# Patient Record
Sex: Male | Born: 1955 | Race: White | Hispanic: No | Marital: Married | Smoking: Never smoker
Health system: Southern US, Community
[De-identification: ages and names within clinical notes are randomized; demographics above are authoritative.]

## PROBLEM LIST (undated history)

## (undated) DIAGNOSIS — G4733 Obstructive sleep apnea (adult) (pediatric): Secondary | ICD-10-CM

## (undated) DIAGNOSIS — J309 Allergic rhinitis, unspecified: Secondary | ICD-10-CM

## (undated) DIAGNOSIS — R350 Frequency of micturition: Secondary | ICD-10-CM

## (undated) DIAGNOSIS — E119 Type 2 diabetes mellitus without complications: Secondary | ICD-10-CM

## (undated) DIAGNOSIS — N320 Bladder-neck obstruction: Secondary | ICD-10-CM

## (undated) DIAGNOSIS — S83206S Unspecified tear of unspecified meniscus, current injury, right knee, sequela: Secondary | ICD-10-CM

## (undated) DIAGNOSIS — K219 Gastro-esophageal reflux disease without esophagitis: Secondary | ICD-10-CM

## (undated) DIAGNOSIS — G4730 Sleep apnea, unspecified: Secondary | ICD-10-CM

## (undated) DIAGNOSIS — Z973 Presence of spectacles and contact lenses: Secondary | ICD-10-CM

## (undated) DIAGNOSIS — E785 Hyperlipidemia, unspecified: Secondary | ICD-10-CM

## (undated) DIAGNOSIS — D649 Anemia, unspecified: Secondary | ICD-10-CM

## (undated) DIAGNOSIS — M1990 Unspecified osteoarthritis, unspecified site: Secondary | ICD-10-CM

## (undated) DIAGNOSIS — D509 Iron deficiency anemia, unspecified: Secondary | ICD-10-CM

## (undated) DIAGNOSIS — F419 Anxiety disorder, unspecified: Secondary | ICD-10-CM

## (undated) DIAGNOSIS — R0789 Other chest pain: Secondary | ICD-10-CM

## (undated) DIAGNOSIS — N401 Enlarged prostate with lower urinary tract symptoms: Secondary | ICD-10-CM

## (undated) DIAGNOSIS — N400 Enlarged prostate without lower urinary tract symptoms: Secondary | ICD-10-CM

## (undated) HISTORY — PX: HEMORROIDECTOMY: SUR656

## (undated) HISTORY — PX: HERNIA REPAIR: SHX51

## (undated) HISTORY — PX: NASAL SINUS SURGERY: SHX719

## (undated) HISTORY — PX: TONSILLECTOMY: SUR1361

---

## 1979-11-17 HISTORY — PX: APPENDECTOMY: SHX54

## 1999-11-17 DIAGNOSIS — J189 Pneumonia, unspecified organism: Secondary | ICD-10-CM

## 1999-11-17 HISTORY — DX: Pneumonia, unspecified organism: J18.9

## 2008-11-16 HISTORY — PX: INGUINAL HERNIA REPAIR: SUR1180

## 2014-11-16 HISTORY — PX: UMBILICAL HERNIA REPAIR: SHX196

## 2017-11-16 HISTORY — PX: OTHER SURGICAL HISTORY: SHX169

## 2018-04-27 DIAGNOSIS — E119 Type 2 diabetes mellitus without complications: Secondary | ICD-10-CM | POA: Diagnosis not present

## 2018-04-27 DIAGNOSIS — E785 Hyperlipidemia, unspecified: Secondary | ICD-10-CM | POA: Diagnosis not present

## 2018-05-23 DIAGNOSIS — J301 Allergic rhinitis due to pollen: Secondary | ICD-10-CM | POA: Diagnosis not present

## 2018-05-25 DIAGNOSIS — E119 Type 2 diabetes mellitus without complications: Secondary | ICD-10-CM | POA: Diagnosis not present

## 2018-05-25 DIAGNOSIS — J301 Allergic rhinitis due to pollen: Secondary | ICD-10-CM | POA: Diagnosis not present

## 2018-06-13 DIAGNOSIS — E785 Hyperlipidemia, unspecified: Secondary | ICD-10-CM | POA: Diagnosis not present

## 2018-06-13 DIAGNOSIS — E119 Type 2 diabetes mellitus without complications: Secondary | ICD-10-CM | POA: Diagnosis not present

## 2018-07-06 DIAGNOSIS — M25511 Pain in right shoulder: Secondary | ICD-10-CM | POA: Diagnosis not present

## 2018-07-06 DIAGNOSIS — M19011 Primary osteoarthritis, right shoulder: Secondary | ICD-10-CM | POA: Diagnosis not present

## 2018-07-06 DIAGNOSIS — M19012 Primary osteoarthritis, left shoulder: Secondary | ICD-10-CM | POA: Diagnosis not present

## 2018-07-28 DIAGNOSIS — M7631 Iliotibial band syndrome, right leg: Secondary | ICD-10-CM | POA: Diagnosis not present

## 2018-07-28 DIAGNOSIS — M1711 Unilateral primary osteoarthritis, right knee: Secondary | ICD-10-CM | POA: Diagnosis not present

## 2018-08-19 DIAGNOSIS — M7672 Peroneal tendinitis, left leg: Secondary | ICD-10-CM | POA: Diagnosis not present

## 2018-08-19 DIAGNOSIS — M67472 Ganglion, left ankle and foot: Secondary | ICD-10-CM | POA: Diagnosis not present

## 2018-08-19 DIAGNOSIS — M79672 Pain in left foot: Secondary | ICD-10-CM | POA: Diagnosis not present

## 2018-08-23 DIAGNOSIS — Z1211 Encounter for screening for malignant neoplasm of colon: Secondary | ICD-10-CM | POA: Diagnosis not present

## 2018-09-01 DIAGNOSIS — J301 Allergic rhinitis due to pollen: Secondary | ICD-10-CM | POA: Diagnosis not present

## 2018-09-05 DIAGNOSIS — J301 Allergic rhinitis due to pollen: Secondary | ICD-10-CM | POA: Diagnosis not present

## 2018-09-08 DIAGNOSIS — M2341 Loose body in knee, right knee: Secondary | ICD-10-CM | POA: Diagnosis not present

## 2018-09-14 DIAGNOSIS — M19012 Primary osteoarthritis, left shoulder: Secondary | ICD-10-CM | POA: Diagnosis not present

## 2018-09-19 DIAGNOSIS — E119 Type 2 diabetes mellitus without complications: Secondary | ICD-10-CM | POA: Diagnosis not present

## 2018-09-19 DIAGNOSIS — Z23 Encounter for immunization: Secondary | ICD-10-CM | POA: Diagnosis not present

## 2018-09-19 DIAGNOSIS — Z0000 Encounter for general adult medical examination without abnormal findings: Secondary | ICD-10-CM | POA: Diagnosis not present

## 2018-09-19 DIAGNOSIS — E785 Hyperlipidemia, unspecified: Secondary | ICD-10-CM | POA: Diagnosis not present

## 2018-09-19 DIAGNOSIS — Z125 Encounter for screening for malignant neoplasm of prostate: Secondary | ICD-10-CM | POA: Diagnosis not present

## 2018-09-21 DIAGNOSIS — Z1211 Encounter for screening for malignant neoplasm of colon: Secondary | ICD-10-CM | POA: Diagnosis not present

## 2018-09-21 DIAGNOSIS — K5730 Diverticulosis of large intestine without perforation or abscess without bleeding: Secondary | ICD-10-CM | POA: Diagnosis not present

## 2018-10-11 DIAGNOSIS — J310 Chronic rhinitis: Secondary | ICD-10-CM | POA: Diagnosis not present

## 2018-11-01 ENCOUNTER — Ambulatory Visit: Payer: Self-pay | Admitting: Allergy and Immunology

## 2018-11-04 DIAGNOSIS — Z7689 Persons encountering health services in other specified circumstances: Secondary | ICD-10-CM | POA: Diagnosis not present

## 2018-11-04 DIAGNOSIS — K5900 Constipation, unspecified: Secondary | ICD-10-CM | POA: Diagnosis not present

## 2021-04-10 ENCOUNTER — Other Ambulatory Visit: Payer: Self-pay

## 2021-04-10 ENCOUNTER — Encounter (HOSPITAL_BASED_OUTPATIENT_CLINIC_OR_DEPARTMENT_OTHER): Payer: Self-pay | Admitting: Specialist

## 2021-04-10 NOTE — Progress Notes (Signed)
Spoke w/ via phone for pre-op interview---PT Lab needs dos----  I stat             Lab results------ECHO 07-05-2020 CARE EVERYWHERE, lov dr Sigmund Hazel 04-08-2021 on chart, ekg 04-08-2021 dr Sigmund Hazel on chart hemaglobin a1c 04-08-2021, cmet 04-08-2021 cbc with dif 04-08-2021 all on chart COVID test -----EXPOSURE  04-15-2021 1010 Arrive at -------915 am 04-16-2021 NPO after MN NO Solid Food.  Clear liquids from MN until---815 am Med rec completed Medications to take morning of surgery -----atorvastatin, certrizine, sertraline, omeprazole, alfuzosin, carafate prn Diabetic medication -----none day of surgery Patient instructed no nail polish to be worn day of surgery n/a Patient instructed to bring photo id and insurance card day of surgery Patient aware to have Driver (ride ) / caregiver   Wife becky will stay  for 24 hours after surgery  Patient Special Instructions -----bring cpcp mask tubing and machine and leave in car Pre-Op special Istructions ----- Patient verbalized understanding of instructions that were given at this phone interview. Patient denies shortness of breath, chest pain, fever, cough at this phone interview.  Spoke with dr Greggory Stallion rose mda and made aware pt history and last hemglobin a1c 04-08-2021 was 10.8, pt to be moved to [facility] or due to uncontrolled dm per dr rose mda, called ashley at dr Thomasena Edis office and left voice mail message. made aware pt to be moved to [facility] due to uncontrolled dm per dr Greggory Stallion rose mda

## 2021-04-11 NOTE — H&P (Signed)
Patrick Cooley is an 65 y.o. male.   Chief Complaint: Right knee pain HPI: This is a pleasant 65 year old male complaining of right knee pain that has been going on for many years but recently worsening. He states that it is worse he tries to get down on the floor to play with grandchildren. He states he has a locking sensation in his knee. A MRI scan was obtained and reviewed and it reveals a small lateral meniscus tear, a loose body as well as some OA of the knee. Patient was offered surgical management and the risks associated with it. He has elected to proceed with surgery at this time.   Past Medical History:  Diagnosis Date  . Acute torn meniscus of knee, right, sequela    loose bodies also  . Allergic rhinitis   . Anemia    on iron po  . Anxiety   . Arthritis    oa all over  . Bladder outlet obstruction   . BPH (benign prostatic hyperplasia)   . DM type 2 (diabetes mellitus, type 2) (HCC)   . GERD (gastroesophageal reflux disease)   . Hyperlipemia   . Pneumonia 2001  . Sleep apnea    uses  cpap  . Tightness in chest    occ with exertion riding mountain bike  . Urinary frequency   . Wears glasses     Past Surgical History:  Procedure Laterality Date  . APPENDECTOMY  1981   open  . COLONSCOPY  2019  . HEMORROIDECTOMY  age 80  . HERNIA REPAIR     umbilicAL 2016  AND INGUINAL 2010 X 2  . NASAL SINUS SURGERY  yrs ago  . TONSILLECTOMY  as child    History reviewed. No pertinent family history. Social History:  reports that he has never smoked. He has never used smokeless tobacco. He reports current alcohol use. He reports that he does not use drugs.  Allergies: No Known Allergies  No medications prior to admission.    No results found for this or any previous visit (from the past 48 hour(s)). No results found.  Review of Systems  All other systems reviewed and are negative.   Height 5\' 5"  (1.651 m), weight 93.9 kg. Physical Exam Vitals reviewed.   Constitutional:      Appearance: Normal appearance. He is normal weight.  HENT:     Head: Normocephalic and atraumatic.  Musculoskeletal:     Comments: ROM 3-130 Cruciate and collateral ligaments are stable + lateral Mc[place]ray  Skin:    General: Skin is warm and dry.     Capillary Refill: Capillary refill takes less than 2 seconds.  Neurological:     General: No focal deficit present.     Mental Status: He is alert and oriented to person, place, and time.  Psychiatric:        Mood and Affect: Mood normal.        Behavior: Behavior normal.        Thought Content: Thought content normal.        Judgment: Judgment normal.      Assessment/Plan Right knee partial lateral meniscus tear, loose body, osteoarthritis: -It was discussed with the patient in the office surgical vs. nonsurgical management. He has elected to proceed with surgical management at this time. Risks and benefits of surgery discussed with the patient in the office.  He will be having a right knee arthroscopy, Partial lateral menisectomy, loose body removal and chondroplasty. Preoperative and  postoperative instructions discussed with the patient. He will follow up in the office in 2 weeks after surgery.   Cherie Dark, PA 04/11/2021, 1:03 PM

## 2021-04-15 ENCOUNTER — Other Ambulatory Visit: Payer: Self-pay | Admitting: Family Medicine

## 2021-04-15 ENCOUNTER — Other Ambulatory Visit (HOSPITAL_COMMUNITY)
Admission: RE | Admit: 2021-04-15 | Discharge: 2021-04-15 | Disposition: A | Discharge status: Home / Self Care | Payer: 59 | Source: Ambulatory Visit | Attending: Specialist | Admitting: Specialist

## 2021-04-15 DIAGNOSIS — Z01812 Encounter for preprocedural laboratory examination: Secondary | ICD-10-CM | POA: Diagnosis present

## 2021-04-15 DIAGNOSIS — Z20822 Contact with and (suspected) exposure to covid-19: Secondary | ICD-10-CM | POA: Insufficient documentation

## 2021-04-15 DIAGNOSIS — R197 Diarrhea, unspecified: Secondary | ICD-10-CM

## 2021-04-15 LAB — SARS CORONAVIRUS 2 (TAT 6-24 HRS): SARS Coronavirus 2: NEGATIVE

## 2021-04-16 ENCOUNTER — Ambulatory Visit (HOSPITAL_BASED_OUTPATIENT_CLINIC_OR_DEPARTMENT_OTHER): Admission: RE | Admit: 2021-04-16 | Payer: 59 | Source: Home / Self Care | Admitting: Specialist

## 2021-04-16 HISTORY — DX: Allergic rhinitis, unspecified: J30.9

## 2021-04-16 HISTORY — DX: Frequency of micturition: R35.0

## 2021-04-16 HISTORY — DX: Type 2 diabetes mellitus without complications: E11.9

## 2021-04-16 HISTORY — DX: Benign prostatic hyperplasia without lower urinary tract symptoms: N40.0

## 2021-04-16 HISTORY — DX: Unspecified tear of unspecified meniscus, current injury, right knee, sequela: S83.206S

## 2021-04-16 HISTORY — DX: Anemia, unspecified: D64.9

## 2021-04-16 HISTORY — DX: Other chest pain: R07.89

## 2021-04-16 HISTORY — DX: Unspecified osteoarthritis, unspecified site: M19.90

## 2021-04-16 HISTORY — DX: Sleep apnea, unspecified: G47.30

## 2021-04-16 HISTORY — DX: Presence of spectacles and contact lenses: Z97.3

## 2021-04-16 HISTORY — DX: Anxiety disorder, unspecified: F41.9

## 2021-04-16 HISTORY — DX: Bladder-neck obstruction: N32.0

## 2021-04-16 HISTORY — DX: Hyperlipidemia, unspecified: E78.5

## 2021-04-16 HISTORY — DX: Gastro-esophageal reflux disease without esophagitis: K21.9

## 2021-04-16 SURGERY — ARTHROSCOPY, KNEE, WITH MEDIAL MENISCECTOMY
Anesthesia: General | Site: Knee | Laterality: Right

## 2021-04-29 ENCOUNTER — Ambulatory Visit
Admission: RE | Admit: 2021-04-29 | Discharge: 2021-04-29 | Disposition: A | Discharge status: Home / Self Care | Payer: Self-pay | Source: Ambulatory Visit | Attending: Family Medicine | Admitting: Family Medicine

## 2021-04-29 ENCOUNTER — Other Ambulatory Visit: Payer: Self-pay | Admitting: Family Medicine

## 2021-04-29 ENCOUNTER — Ambulatory Visit
Admission: RE | Admit: 2021-04-29 | Discharge: 2021-04-29 | Disposition: A | Discharge status: Home / Self Care | Payer: 59 | Source: Ambulatory Visit | Attending: Family Medicine | Admitting: Family Medicine

## 2021-04-29 DIAGNOSIS — R197 Diarrhea, unspecified: Secondary | ICD-10-CM

## 2021-05-02 ENCOUNTER — Other Ambulatory Visit: Payer: 59

## 2021-05-13 ENCOUNTER — Other Ambulatory Visit: Payer: Self-pay

## 2021-05-13 ENCOUNTER — Encounter: Payer: Self-pay | Admitting: Internal Medicine

## 2021-05-13 ENCOUNTER — Ambulatory Visit (INDEPENDENT_AMBULATORY_CARE_PROVIDER_SITE_OTHER): Payer: 59 | Admitting: Internal Medicine

## 2021-05-13 VITALS — BP 140/84 | HR 83 | Ht 65.0 in | Wt 204.2 lb

## 2021-05-13 DIAGNOSIS — R072 Precordial pain: Secondary | ICD-10-CM | POA: Diagnosis not present

## 2021-05-13 DIAGNOSIS — G4733 Obstructive sleep apnea (adult) (pediatric): Secondary | ICD-10-CM

## 2021-05-13 DIAGNOSIS — E785 Hyperlipidemia, unspecified: Secondary | ICD-10-CM

## 2021-05-13 DIAGNOSIS — N400 Enlarged prostate without lower urinary tract symptoms: Secondary | ICD-10-CM | POA: Diagnosis not present

## 2021-05-13 DIAGNOSIS — E1169 Type 2 diabetes mellitus with other specified complication: Secondary | ICD-10-CM | POA: Diagnosis not present

## 2021-05-13 DIAGNOSIS — Z9989 Dependence on other enabling machines and devices: Secondary | ICD-10-CM

## 2021-05-13 NOTE — Progress Notes (Signed)
Cardiology Office Note:    Date:  05/13/2021   ID:  Patrick Cooley, DOB 12-04-1955, MRN [id number]  PCP:  Sigmund Hazel, MD  Cardiologist:  None  Electrophysiologist:  None   Referring MD: Sigmund Hazel, MD   Chief Complaint/Reason for Referral: Chest pressure  History of Present Illness:    Patrick Cooley is a 65 y.o. male with a history of anemia, anxiety, arthritis, DM type 2, GERD, hyperlipemia, sleep apnea (with CPAP) who presents today for initial evaluation and management of chest pressure.   Today, he reports that his chest pressure occurs primarily when mountain biking and exerting while going up an incline. He will experience a tension or pressure across his chest that correlates with slight tunnel vision. This has newly developed over the last 6 months to 1 year. The chest tightness will usually occur with over-exertion while mountain biking. Typically, he notes that the chest pressure is a "subtle" feeling, and will dissipate a few minutes after he is no longer exerting. He confirms mild bendopnea, but denies any dyspnea on exertion.  Last year, while at a visit to his PCP he was once told he had a heart murmur. In the past few years, he states his blood pressure has been mostly stable and well-controlled. He endorses snoring, and is currently using a CPAP regularly. His sleep is improved while using the CPAP.  He was scheduled for knee surgery several weeks ago, but this was cancelled because his A1C was 10.2%. Also, he is also being tested for possible gallbladder issues. He notes having "bad acid attacks." These flare-ups include burning sensations, nausea, and diarrhea. Of note, this is different from his chest tightness.  He presents records of previous CV testing at this visit: 2009 Stress Test. Excellent exercise capacity. 10.5 minutes, 12-1/2 METS, normal stress test. 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 He did have previous exposure to radiation. However, he denies overexposure as this was tracked carefully with dosing that. In his family, both of his grandfathers had heart attacks in their 17's. His father had a heart attack in his 50's.  He denies any palpitations. No headaches, lightheadedness, or syncope. Also has no lower extremity edema, orthopnea or PND.   Past Medical History:  Diagnosis Date   Acute torn meniscus of knee, right, sequela    loose bodies also   Allergic rhinitis    Anemia    on iron po   Anxiety    Arthritis    oa all over   Bladder outlet obstruction    BPH (benign prostatic hyperplasia)    DM type 2 (diabetes mellitus, type 2) (HCC)    GERD (gastroesophageal reflux disease)    Hyperlipemia    Pneumonia 2001   Sleep apnea    uses  cpap   Tightness in chest    occ with exertion riding mountain bike   Urinary frequency    Wears glasses     Past Surgical History:  Procedure Laterality Date   APPENDECTOMY  1981   open   COLONSCOPY  2019   HEMORROIDECTOMY  age 50   HERNIA REPAIR     umbilicAL 2016  AND INGUINAL 2010 X 2   NASAL SINUS SURGERY  yrs ago   TONSILLECTOMY  as child    Current Medications: Current Meds  Medication Sig   alfuzosin (UROXATRAL) 10 MG 24 hr  tablet Take 10 mg by mouth daily with breakfast.   atorvastatin (LIPITOR) 20 MG tablet Take 20 mg by mouth daily.   cetirizine (ZYRTEC) 10 MG tablet Take 10 mg by mouth daily.   Cyanocobalamin (VITAMIN B 12) 500 MCG TABS Take by mouth daily.   Dulaglutide (TRULICITY) 3 MG/0.5ML SOPN Inject into the skin once a week. WEDNESDAY   ferrous sulfate 325 (65 FE) MG tablet Take 325 mg by mouth every evening.   glipiZIDE (GLUCOTROL) 5 MG tablet Take 5 mg by mouth daily.   metFORMIN (GLUCOPHAGE) 1000 MG tablet Take 1,000 mg by mouth 2 (two) times daily with a meal.   naproxen sodium (ALEVE) 220 MG tablet Take 220 mg by mouth 2 (two) times daily with a meal.    OVER THE COUNTER MEDICATION VITAMIN D 3 2000 IU DAILY   pantoprazole (PROTONIX) 40 MG tablet Take 40 mg by mouth daily.   sertraline (ZOLOFT) 50 MG tablet Take 50 mg by mouth daily.   sitaGLIPtin (JANUVIA) 100 MG tablet Take 100 mg by mouth daily.   tadalafil (CIALIS) 5 MG tablet Take 5 mg by mouth every evening.     Allergies:   Patient has no known allergies.   Social History   Tobacco Use   Smoking status: Never   Smokeless tobacco: Never  Vaping Use   Vaping Use: Never used  Substance Use Topics   Alcohol use: Yes    Comment: MODERATE   Drug use: Never     Family History: The patient's family history is not on file.  ROS:   Please see the history of present illness.    (+) Chest tightness/pressure (+) Tunnel vision (+) Bendopnea (+) Snoring (+) GI burning sensation (+) Nausea (+) Diarrhea All other systems reviewed and are negative.  EKGs/Labs/Other Studies Reviewed:    The following studies were reviewed today: No prior CV studies available.  EKG:  05/13/2021: Sinus rhythm, rate 83 bpm   Recent Labs: No results found for requested labs within last 8760 hours.  Recent Lipid Panel No results found for: CHOL, TRIG, HDL, CHOLHDL, VLDL, LDLCALC, LDLDIRECT  Physical Exam:    VS:  BP 140/84 (BP Location: Left Arm, Patient Position: Sitting, Cuff Size: Normal)   Pulse 83   Ht 5\' 5"  (1.651 m)   Wt 204 lb 3.2 oz (92.6 kg)   SpO2 98%   BMI 33.98 kg/m     Wt Readings from Last 5 Encounters:  05/13/21 204 lb 3.2 oz (92.6 kg)    Constitutional: No acute distress Eyes: sclera non-icteric, normal conjunctiva and lids ENMT: normal dentition, moist mucous membranes Cardiovascular: regular rhythm, normal rate, 1/6 systolic murmur. S1 and S2 normal. Radial pulses normal bilaterally. No jugular venous distention.  Respiratory: clear to auscultation bilaterally GI : normal bowel sounds, soft and nontender. No distention.   MSK: extremities warm, well perfused. No  edema.  NEURO: grossly nonfocal exam, moves all extremities. PSYCH: alert and oriented x 3, normal mood and affect.   ASSESSMENT:    1. Precordial pain   2. OSA on CPAP   3. Type 2 diabetes mellitus with other specified complication, without long-term current use of insulin (HCC)   4. Benign prostatic hyperplasia, unspecified whether lower urinary tract symptoms present   5. Hyperlipidemia, unspecified hyperlipidemia type    PLAN:    Precordial pain - Plan: EKG 12-Lead, MYOCARDIAL PERFUSION IMAGING -Patient describes symptoms of cardiac chest pain with exertion with a history of diabetes with recent  suboptimal control, in addition to hyperlipidemia and family history of CAD.  He also has a history of coronary artery calcifications on prior CT coronary calcium score.  We should screen for ischemia and I will perform a treadmill Myoview stress test.  OSA on CPAP-continue use of CPAP  Type 2 diabetes mellitus with other specified complication, without long-term current use of insulin (HCC)-currently on metformin, may be a good candidate for resumption of Marcelline DeistFarxiga if affordable.  Also on Trulicity which has helped the most.  Hyperlipidemia, unspecified hyperlipidemia type-currently on Lipitor 20 mg daily with most recent LDL 38.  Triglycerides significantly elevated at 465 when A1c was quite elevated.  Would recommend rechecking these in 3 to 6 months from last lab.    Weston BrassGayatri Merrit Waugh, MD, [facility] [place]  [facility]   Shared Decision Making/Informed Consent:   Shared Decision Making/Informed Consent The risks [chest pain, shortness of breath, cardiac arrhythmias, dizziness, blood pressure fluctuations, myocardial infarction, stroke/transient ischemic attack, nausea, vomiting, allergic reaction, radiation exposure, metallic taste sensation and life-threatening complications (estimated to be 1 in 10,000)], benefits (risk stratification, diagnosing coronary artery disease, treatment  guidance) and alternatives of a nuclear stress test were discussed in detail with Patrick Cooley and he agrees to proceed.   Medication Adjustments/Labs and Tests Ordered: Current medicines are reviewed at length with the patient today.  Concerns regarding medicines are outlined above.   Orders Placed This Encounter  Procedures   Cardiac Stress Test: Informed Consent Details: Physician/Practitioner Attestation; Transcribe to consent form and obtain patient signature   MYOCARDIAL PERFUSION IMAGING   EKG 12-Lead     No orders of the defined types were placed in this encounter.   Patient Instructions  Medication Instructions:  No Changes In Medications at this time.  *If you need a refill on your cardiac medications before your next appointment, please call your pharmacy*  Testing/Procedures:  Your physician has requested that you have en exercise stress myoview. For further information please visit [email or web address]. Please follow instruction sheet, as given.  The test will take approximately 3 to 4 hours to complete; you may bring reading material.  If someone comes with you to your appointment, they will need to remain in the main lobby due to limited space in the testing area.    How to prepare for your Myocardial Perfusion Test: Do not eat or drink 3 hours prior to your test, except you may have water. Do not consume products containing caffeine (regular or decaffeinated) 12 hours prior to your test. (ex: coffee, chocolate, sodas, tea). Do wear comfortable clothes (no dresses or overalls) and walking shoes, tennis shoes preferred (No heels or open toe shoes are allowed). Do NOT wear cologne, perfume, aftershave, or lotions (deodorant is allowed). If you use an inhaler, use it the AM of your test and bring it with you.  If you use a nebulizer, use it the AM of your test.  If these instructions are not followed, your test will have to be rescheduled.  Follow-Up: At [facility],  you and your health needs are our priority.  As part of our continuing mission to provide you with exceptional heart care, we have created designated Provider Care Teams.  These Care Teams include your primary Cardiologist (physician) and Advanced Practice Providers (APPs -  Physician Assistants and Nurse Practitioners) who all work together to provide you with the care you need, when you need it.  Your next appointment:   AFTER STRESS TEST   The format  for your next appointment:   In Person  Provider:   Weston Brass, MD     [facility]ump[facility],acting as a scribe for Parke Poisson, MD.,have documented all relevant documentation on the behalf of Parke Poisson, MD,as directed by  Parke Poisson, MD while in the presence of Parke Poisson, MD.  I, Parke Poisson, MD, have reviewed all documentation for this visit. The documentation on 05/13/21 for the exam, diagnosis, procedures, and orders are all accurate and complete.

## 2021-05-13 NOTE — Patient Instructions (Signed)
Medication Instructions:  No Changes In Medications at this time.  *If you need a refill on your cardiac medications before your next appointment, please call your pharmacy*  Testing/Procedures:  Your physician has requested that you have en exercise stress myoview. For further information please visit [email or web address] Please follow instruction sheet, as given.  The test will take approximately 3 to 4 hours to complete; you may bring reading material.  If someone comes with you to your appointment, they will need to remain in the main lobby due to limited space in the testing area.    How to prepare for your Myocardial Perfusion Test: Do not eat or drink 3 hours prior to your test, except you may have water. Do not consume products containing caffeine (regular or decaffeinated) 12 hours prior to your test. (ex: coffee, chocolate, sodas, tea). Do wear comfortable clothes (no dresses or overalls) and walking shoes, tennis shoes preferred (No heels or open toe shoes are allowed). Do NOT wear cologne, perfume, aftershave, or lotions (deodorant is allowed). If you use an inhaler, use it the AM of your test and bring it with you.  If you use a nebulizer, use it the AM of your test.  If these instructions are not followed, your test will have to be rescheduled.  Follow-Up: At [facility], you and your health needs are our priority.  As part of our continuing mission to provide you with exceptional heart care, we have created designated Provider Care Teams.  These Care Teams include your primary Cardiologist (physician) and Advanced Practice Providers (APPs -  Physician Assistants and Nurse Practitioners) who all work together to provide you with the care you need, when you need it.  Your next appointment:   AFTER STRESS TEST   The format for your next appointment:   In Person  Provider:   Weston Brass, MD

## 2021-05-16 ENCOUNTER — Other Ambulatory Visit (HOSPITAL_COMMUNITY): Payer: Self-pay | Admitting: Family Medicine

## 2021-05-16 ENCOUNTER — Other Ambulatory Visit: Payer: Self-pay | Admitting: Family Medicine

## 2021-05-16 DIAGNOSIS — Z8616 Personal history of COVID-19: Secondary | ICD-10-CM

## 2021-05-16 DIAGNOSIS — R935 Abnormal findings on diagnostic imaging of other abdominal regions, including retroperitoneum: Secondary | ICD-10-CM

## 2021-05-16 HISTORY — DX: Personal history of COVID-19: Z86.16

## 2021-05-27 ENCOUNTER — Encounter (HOSPITAL_COMMUNITY): Payer: Self-pay | Admitting: *Deleted

## 2021-05-27 ENCOUNTER — Telehealth (HOSPITAL_COMMUNITY): Payer: Self-pay | Admitting: *Deleted

## 2021-05-27 NOTE — Telephone Encounter (Signed)
Patient given detailed instructions per Myocardial Perfusion Study Information Sheet for the test on 06/02/21 at 1045. Patient notified to arrive 15 minutes early and that it is imperative to arrive on time for appointment to keep from having the test rescheduled.  If you need to cancel or reschedule your appointment, please call the office within 24 hours of your appointment. . Patient verbalized understanding.Patrick Cooley, Patrick Cooley Mychart letter sent with instructions

## 2021-06-02 ENCOUNTER — Other Ambulatory Visit: Payer: Self-pay

## 2021-06-02 ENCOUNTER — Ambulatory Visit (HOSPITAL_COMMUNITY): Payer: 59 | Attending: Cardiology

## 2021-06-02 DIAGNOSIS — R072 Precordial pain: Secondary | ICD-10-CM | POA: Diagnosis not present

## 2021-06-02 LAB — MYOCARDIAL PERFUSION IMAGING
Estimated workload: 7 METS
Exercise duration (min): 6 min
Exercise duration (sec): 0 s
LV dias vol: 99 mL (ref 62–150)
LV sys vol: 50 mL
Peak HR: 150 {beats}/min
Percent HR: 95 %
Rest HR: 78 {beats}/min
SDS: 0
SRS: 0
SSS: 0
TID: 1.09

## 2021-06-02 MED ORDER — TECHNETIUM TC 99M TETROFOSMIN IV KIT
10.1000 | PACK | Freq: Once | INTRAVENOUS | Status: AC | PRN
  Administered 2021-06-02: 10.1 via INTRAVENOUS
  Filled 2021-06-02: qty 11

## 2021-06-02 MED ORDER — TECHNETIUM TC 99M TETROFOSMIN IV KIT
31.3000 | PACK | Freq: Once | INTRAVENOUS | Status: AC | PRN
  Administered 2021-06-02: 31.3 via INTRAVENOUS
  Filled 2021-06-02: qty 32

## 2021-06-05 ENCOUNTER — Other Ambulatory Visit: Payer: Self-pay

## 2021-06-05 DIAGNOSIS — Z79899 Other long term (current) drug therapy: Secondary | ICD-10-CM

## 2021-06-05 MED ORDER — LOSARTAN POTASSIUM 25 MG PO TABS: 25.0000 mg | ORAL_TABLET | Freq: Every day | ORAL | 3 refills | Status: DC

## 2021-06-26 ENCOUNTER — Other Ambulatory Visit: Payer: Self-pay

## 2021-06-26 ENCOUNTER — Telehealth: Payer: Self-pay

## 2021-06-26 DIAGNOSIS — Z79899 Other long term (current) drug therapy: Secondary | ICD-10-CM

## 2021-06-26 NOTE — Telephone Encounter (Signed)
Called patient due to not having blood work done 10 days after starting Losartan. Advised patient to come to office at earliest convenience to have this completed. Patient will come today to have BMET drawn. Orders are in.  Patient's follow up office visit with Dr. Jacques Navy is scheduled for 8/30. Advised patient to call back to office with any issues, questions, or concerns. Patient verbalized understanding.

## 2021-06-27 LAB — BASIC METABOLIC PANEL
BUN/Creatinine Ratio: 14 (ref 10–24)
BUN: 15 mg/dL (ref 8–27)
CO2: 23 mmol/L (ref 20–29)
Calcium: 9.4 mg/dL (ref 8.6–10.2)
Chloride: 103 mmol/L (ref 96–106)
Creatinine, Ser: 1.11 mg/dL (ref 0.76–1.27)
Glucose: 117 mg/dL — ABNORMAL HIGH (ref 65–99)
Potassium: 4.2 mmol/L (ref 3.5–5.2)
Sodium: 140 mmol/L (ref 134–144)
eGFR: 75 mL/min/{1.73_m2} (ref >59)

## 2021-07-09 ENCOUNTER — Other Ambulatory Visit: Payer: Self-pay

## 2021-07-09 ENCOUNTER — Other Ambulatory Visit (HOSPITAL_COMMUNITY): Payer: Self-pay | Admitting: Family Medicine

## 2021-07-09 ENCOUNTER — Ambulatory Visit (HOSPITAL_COMMUNITY)
Admission: RE | Admit: 2021-07-09 | Discharge: 2021-07-09 | Disposition: A | Discharge status: Home / Self Care | Payer: 59 | Source: Ambulatory Visit | Attending: Family Medicine | Admitting: Family Medicine

## 2021-07-09 DIAGNOSIS — R935 Abnormal findings on diagnostic imaging of other abdominal regions, including retroperitoneum: Secondary | ICD-10-CM | POA: Insufficient documentation

## 2021-07-09 MED ORDER — GADOBUTROL 1 MMOL/ML IV SOLN
9.0000 mL | Freq: Once | INTRAVENOUS | Status: AC | PRN
  Administered 2021-07-09: 9 mL via INTRAVENOUS

## 2021-07-15 ENCOUNTER — Other Ambulatory Visit: Payer: Self-pay

## 2021-07-15 ENCOUNTER — Ambulatory Visit (INDEPENDENT_AMBULATORY_CARE_PROVIDER_SITE_OTHER): Payer: 59 | Admitting: Internal Medicine

## 2021-07-15 ENCOUNTER — Encounter: Payer: Self-pay | Admitting: Internal Medicine

## 2021-07-15 VITALS — BP 124/76 | HR 80 | Ht 65.0 in | Wt 210.0 lb

## 2021-07-15 DIAGNOSIS — E1169 Type 2 diabetes mellitus with other specified complication: Secondary | ICD-10-CM | POA: Diagnosis not present

## 2021-07-15 DIAGNOSIS — I10 Essential (primary) hypertension: Secondary | ICD-10-CM

## 2021-07-15 DIAGNOSIS — Z79899 Other long term (current) drug therapy: Secondary | ICD-10-CM

## 2021-07-15 DIAGNOSIS — R072 Precordial pain: Secondary | ICD-10-CM

## 2021-07-15 DIAGNOSIS — Z9989 Dependence on other enabling machines and devices: Secondary | ICD-10-CM

## 2021-07-15 DIAGNOSIS — G4733 Obstructive sleep apnea (adult) (pediatric): Secondary | ICD-10-CM

## 2021-07-15 DIAGNOSIS — E785 Hyperlipidemia, unspecified: Secondary | ICD-10-CM

## 2021-07-15 NOTE — Progress Notes (Signed)
Cardiology Office Note:    Date:  07/15/2021   ID:  Patrick Cooley, DOB 1956/08/14, MRN [id number]  PCP:  Sigmund Hazel, MD  Cardiologist:  None  Electrophysiologist:  None   Referring MD: Sigmund Hazel, MD   Chief Complaint/Reason for Referral: Chest pressure  History of Present Illness:    Patrick Cooley is a 65 y.o. male with a history of anemia, anxiety, arthritis, DM type 2, GERD, hyperlipemia, sleep apnea (with CPAP) who presents today for initial evaluation and management of chest pressure.   Since our last visit he contracted COVID around 4 July and has had significant chest congestion.  He also feels he is recently ill with RSV potentially, and is still somewhat symptomatic.  He is not ridden his bike very much recently due to illness but still notes some chest tightness with this activity though less than before.  We again reviewed results of stress testing which showed average exercise capacity and hypertensive response to exercise but no ischemia.  For this I started him on losartan 25 mg daily.  As he recovers from his respiratory illnesses it may be worth noting whether his symptom of chest tightness improves.  We discussed the mechanism of hypertension related diastolic dysfunction which may contribute to chest tightness.  The patient denies palpitations, PND, orthopnea, or leg swelling. Denies nausea, vomiting. Denies syncope or presyncope. Denies dizziness or lightheadedness.    Past Medical History:  Diagnosis Date   Acute torn meniscus of knee, right, sequela    loose bodies also   Allergic rhinitis    Anemia    on iron po   Anxiety    Arthritis    oa all over   Bladder outlet obstruction    BPH (benign prostatic hyperplasia)    DM type 2 (diabetes mellitus, type 2) (HCC)    GERD (gastroesophageal reflux disease)    Hyperlipemia    Pneumonia 2001   Sleep apnea    uses  cpap   Tightness in chest    occ with exertion riding mountain bike   Urinary frequency     Wears glasses     Past Surgical History:  Procedure Laterality Date   APPENDECTOMY  1981   open   COLONSCOPY  2019   HEMORROIDECTOMY  age 18   HERNIA REPAIR     umbilicAL 2016  AND INGUINAL 2010 X 2   NASAL SINUS SURGERY  yrs ago   TONSILLECTOMY  as child    Current Medications: Current Meds  Medication Sig   alfuzosin (UROXATRAL) 10 MG 24 hr tablet Take 10 mg by mouth daily with breakfast.   atorvastatin (LIPITOR) 20 MG tablet Take 20 mg by mouth daily.   cetirizine (ZYRTEC) 10 MG tablet Take 10 mg by mouth daily.   Cyanocobalamin (VITAMIN B 12) 500 MCG TABS Take by mouth daily.   Dulaglutide (TRULICITY) 3 MG/0.5ML SOPN Inject into the skin once a week. WEDNESDAY   ferrous sulfate 325 (65 FE) MG tablet Take 325 mg by mouth every evening.   glipiZIDE (GLUCOTROL) 5 MG tablet Take 5 mg by mouth daily.   losartan (COZAAR) 25 MG tablet Take 1 tablet (25 mg total) by mouth daily.   metFORMIN (GLUCOPHAGE) 1000 MG tablet Take 1,000 mg by mouth 2 (two) times daily with a meal.   naproxen sodium (ALEVE) 220 MG tablet Take 220 mg by mouth 2 (two) times daily with a meal.   pantoprazole (PROTONIX) 40 MG tablet Take 40 mg by mouth  daily.   sertraline (ZOLOFT) 50 MG tablet Take 50 mg by mouth daily.   sitaGLIPtin (JANUVIA) 100 MG tablet Take 100 mg by mouth daily.   tadalafil (CIALIS) 5 MG tablet Take 5 mg by mouth every evening.     Allergies:   Patient has no known allergies.   Social History   Tobacco Use   Smoking status: Never   Smokeless tobacco: Never  Vaping Use   Vaping Use: Never used  Substance Use Topics   Alcohol use: Yes    Comment: MODERATE   Drug use: Never     Family History: The patient's family history is not on file.  ROS:   Please see the history of present illness.    All other systems reviewed and are negative.  EKGs/Labs/Other Studies Reviewed:    The following studies were reviewed today: Nuc stress 06/02/21 The left ventricular ejection fraction  is mildly decreased (45-54%). Nuclear stress EF calculated at 50% but visually appears to be at least 55%. Blood pressure demonstrated a hypertensive response to exercise. There was no ST segment deviation noted during stress. The study is normal. This is a low risk study.    EKG: Not performed today 05/13/2021: Sinus rhythm, rate 83 bpm   Recent Labs: 06/26/2021: BUN 15; Creatinine, Ser 1.11; Potassium 4.2; Sodium 140  Recent Lipid Panel No results found for: CHOL, TRIG, HDL, CHOLHDL, VLDL, LDLCALC, LDLDIRECT  Physical Exam:    VS:  BP 124/76 (BP Location: Left Arm, Patient Position: Sitting, Cuff Size: Normal)   Pulse 80   Ht 5\' 5"  (1.651 m)   Wt 210 lb (95.3 kg)   SpO2 97%   BMI 34.95 kg/m     Wt Readings from Last 5 Encounters:  07/15/21 210 lb (95.3 kg)  06/02/21 204 lb (92.5 kg)  05/13/21 204 lb 3.2 oz (92.6 kg)    Constitutional: No acute distress Eyes: sclera non-icteric, normal conjunctiva and lids ENMT: normal dentition, moist mucous membranes Cardiovascular: regular rhythm, normal rate, no murmurs. S1 and S2 normal. Radial pulses normal bilaterally. No jugular venous distention.  Respiratory: clear to auscultation bilaterally GI : normal bowel sounds, soft and nontender. No distention.   MSK: extremities warm, well perfused. No edema.  NEURO: grossly nonfocal exam, moves all extremities. PSYCH: alert and oriented x 3, normal mood and affect.    ASSESSMENT:    1. Precordial pain   2. Medication management   3. OSA on CPAP   4. Type 2 diabetes mellitus with other specified complication, without long-term current use of insulin (HCC)   5. Hyperlipidemia, unspecified hyperlipidemia type   6. Hypertensive response to exercise     PLAN:    Precordial pain Hypertensive response to exercise -Low risk stress test with no ischemia, however did have a hypertensive response to exercise with 6 minutes of exercise.  I have started him on losartan 25 mg daily and he  is tolerating this well.  Only occasionally has some orthostatic symptoms but he is willing to stay on the medication and monitor the symptoms as he continues to improve from his recent respiratory illnesses.  No significant chest tightness with his last bike ride, however he has not ridden with the frequency he did before due to illness.  Continue to monitor and we will follow-up soon.  OSA on CPAP-continue use of CPAP  Type 2 diabetes mellitus with other specified complication, without long-term current use of insulin (HCC)-currently on metformin, may be a good candidate for  resumption of Marcelline Deist if affordable.  Also on Trulicity which has helped the most.  Hyperlipidemia, unspecified hyperlipidemia type-currently on Lipitor 20 mg daily with most recent LDL 38.  Triglycerides significantly elevated at 465 when A1c was quite elevated.  Would recommend rechecking these with PCP today.   Total time of encounter: 30 minutes total time of encounter, including 20 minutes spent in face-to-face patient care on the date of this encounter. This time includes coordination of care and counseling regarding above mentioned problem list. Remainder of non-face-to-face time involved reviewing chart documents/testing relevant to the patient encounter and documentation in the medical record. I have independently reviewed documentation from referring provider.   Weston Brass, MD, [facility] [place]  [facility]    Shared Decision Making/Informed Consent:     Medication Adjustments/Labs and Tests Ordered: Current medicines are reviewed at length with the patient today.  Concerns regarding medicines are outlined above.   No orders of the defined types were placed in this encounter.    No orders of the defined types were placed in this encounter.   Patient Instructions  Medication Instructions:  No Changes In Medications at this time.  *If you need a refill on your cardiac medications before your  next appointment, please call your pharmacy*  Follow-Up: At [facility], you and your health needs are our priority.  As part of our continuing mission to provide you with exceptional heart care, we have created designated Provider Care Teams.  These Care Teams include your primary Cardiologist (physician) and Advanced Practice Providers (APPs -  Physician Assistants and Nurse Practitioners) who all work together to provide you with the care you need, when you need it.  Your next appointment:   10/14/21 at 9:20  The format for your next appointment:   In Person  Provider:   Weston Brass, MD

## 2021-07-15 NOTE — Patient Instructions (Signed)
Medication Instructions:  No Changes In Medications at this time.  *If you need a refill on your cardiac medications before your next appointment, please call your pharmacy*  Follow-Up: At [facility] you and your health needs are our priority.  As part of our continuing mission to provide you with exceptional heart care, we have created designated Provider Care Teams.  These Care Teams include your primary Cardiologist (physician) and Advanced Practice Providers (APPs -  Physician Assistants and Nurse Practitioners) who all work together to provide you with the care you need, when you need it.  Your next appointment:   10/14/21 at 9:20  The format for your next appointment:   In Person  Provider:   Weston Brass, MD

## 2021-07-16 ENCOUNTER — Telehealth: Payer: Self-pay

## 2021-07-16 NOTE — Telephone Encounter (Signed)
   [facility] Pre-operative Risk Assessment    Patient Name: Patrick Cooley  DOB: Jun 20, 1956 MRN: [id number]  HEARTCARE STAFF:  - IMPORTANT!!!!!! Under Visit Info/Reason for Call, type in Other and utilize the format Clearance MM/DD/YY or Clearance TBD. Do not use dashes or single digits. - Please review there is not already an duplicate clearance open for this procedure. - If request is for dental extraction, please clarify the # of teeth to be extracted. - If the patient is currently at the dentist's office, call Pre-Op Callback Staff (MA/nurse) to input urgent request.  - If the patient is not currently in the dentist office, please route to the Pre-Op pool.  Request for surgical clearance:  What type of surgery is being performed? Right Knee Arthroscopic PLM/Chondroplasty  When is this surgery scheduled? TBD  What type of clearance is required (medical clearance vs. Pharmacy clearance to hold med vs. Both)? Medical   Are there any medications that need to be held prior to surgery and how long? None   Practice name and name of physician performing surgery? [facility]   What is the office phone number? [phone]   7.   What is the office fax number? [phone] Attn: Kerri Maze  8.   Anesthesia type (None, local, MAC, general) ? General    Zebedee Iba 07/16/2021, 3:55 PM  _________________________________________________________________   (provider comments below)

## 2021-07-17 NOTE — Telephone Encounter (Signed)
Hi Dr. Jacques Navy,  Patrick Cooley is planning on having a right knee arthroscopic PLM/chondroplasty (date TBD). I saw that you have been following him recently for chest pain. Myoview on 06/02/2021 was low risk with no evidence of ischemia but he did demonstrate a hypertensive response to exercise. He was started on Losartan You saw him earlier this week on 07/15/2021 for follow-up at which time he was still recovering from recent COVID infection with possible RSV and was still reporting some chest tightness with activity (though less than before). You recommended continue monitoring for now with follow-up in November. Do you feel like patient is OK to go ahead and proceed with knee procedure or would you like for him to wait and see how he is doing at follow-up visit first?  Please route response back to P CV DIV PREOP.  Thank you! Maryann Mccall

## 2021-07-28 NOTE — Telephone Encounter (Signed)
Kerri for [facility] was calling back to see if there was an update.

## 2021-08-05 NOTE — Telephone Encounter (Signed)
   Patient Name: Patrick Cooley  DOB: 1956/01/31 MRN: [id number]  Primary Cardiologist: Dr. Jacques Navy  Chart reviewed as part of pre-operative protocol coverage. I am coming on board today. As below, we are awaiting MD response following recent OV whether patient may proceed with surgery. Will re-route back to MD for review. Will also route update to ortho team that further recs are forthcoming once we receive MD reply.   Laurann Montana, PA-C 08/05/2021, 3:10 PM

## 2021-08-07 ENCOUNTER — Encounter (HOSPITAL_BASED_OUTPATIENT_CLINIC_OR_DEPARTMENT_OTHER): Payer: Self-pay | Admitting: Specialist

## 2021-08-07 ENCOUNTER — Other Ambulatory Visit: Payer: Self-pay

## 2021-08-07 NOTE — Telephone Encounter (Signed)
Reviewed with Dr. Jacques Navy personally.  Patient is cleared at acceptable risk.  I will forward this message to requesting provider.   Manson Passey, PA -C

## 2021-08-07 NOTE — Telephone Encounter (Signed)
Sent secure message to Dr. Jacques Navy to review surgical clearance response.

## 2021-08-07 NOTE — Telephone Encounter (Signed)
Kerri from [facility] was calling to check the status of the surgical clearance. The patient is scheduled to have surgery Tuesday 08/12/21

## 2021-08-07 NOTE — Progress Notes (Signed)
Spoke w/ via phone for pre-op interview--- pt Lab needs dos---- cbc, cmp              Lab results------ current ekg in epic/ chart COVID test -----patient states asymptomatic no test needed Arrive at ------- 1130 on 08-12-2021 NPO after MN NO Solid Food.  Clear liquids from MN until--- 1030 Med rec completed Medications to take morning of surgery ----- zoloft, zyrtec, lipitor, uroxatral, protonix Diabetic medication ----- do not take metformin, januvia, glipizide, farxiga morning of surgery Patient instructed no nail polish to be worn day of surgery Patient instructed to bring photo id and insurance card day of surgery Patient aware to have Driver (ride ) / caregiver   for 24 hours after surgery --wife, becky Patient Special Instructions ----- asked pt to bring cpap/ mask/ tubing dos , leave in car Pre-Op special Istructions ----- pt has cardiac telephone clearance by bhagat bhavinkumar PA on 08-07-2021, in epic/ chart Patient verbalized understanding of instructions that were given at this phone interview. Patient denies shortness of breath, chest pain, fever, cough at this phone interview.    Anesthesia Review:  HTN; OSA stated uses cpap nightly;  pt evaluated by cardiology for precordial pain, note in epic 07-15-2021;  DM2 (pt stated last A1c 6.8 by pcp 07-15-2021) ;  pt denies cardiac s&s, sob, and no peripheral swelling.  PCP: Dr L. Miller (per pt lov 07-15-2021) Cardiologist : Dr Reece Agar. Jacques Navy [facility] 07-15-2021 epic) Chest x-ray :  no EKG : 05-13-2021 epic Echo : 07-05-2020 [facility] Stress test: 06-02-2021 epic Cardiac Cath :  no Activity level:  occasional sob with riding bike Sleep Study/ CPAP : Yes/ Yes Fasting Blood Sugar :  114--125    / Checks Blood Sugar -- times a day:  daily in am Blood Thinner/ Instructions /Last Dose: no ASA / Instructions/ Last Dose :  ASA 81mg / Pt stated not given any instructions if need to stop or continue, advised to call dr office

## 2021-08-12 ENCOUNTER — Other Ambulatory Visit: Payer: Self-pay

## 2021-08-12 ENCOUNTER — Ambulatory Visit (HOSPITAL_BASED_OUTPATIENT_CLINIC_OR_DEPARTMENT_OTHER): Payer: 59 | Admitting: Anesthesiology

## 2021-08-12 ENCOUNTER — Ambulatory Visit (HOSPITAL_BASED_OUTPATIENT_CLINIC_OR_DEPARTMENT_OTHER)
Admission: RE | Admit: 2021-08-12 | Discharge: 2021-08-12 | Disposition: A | Discharge status: Home / Self Care | Payer: 59 | Attending: Specialist | Admitting: Specialist

## 2021-08-12 ENCOUNTER — Encounter (HOSPITAL_BASED_OUTPATIENT_CLINIC_OR_DEPARTMENT_OTHER)
Admission: RE | Disposition: A | Discharge status: Home / Self Care | Payer: Self-pay | Source: Home / Self Care | Attending: Specialist

## 2021-08-12 ENCOUNTER — Encounter (HOSPITAL_BASED_OUTPATIENT_CLINIC_OR_DEPARTMENT_OTHER): Payer: Self-pay | Admitting: Specialist

## 2021-08-12 DIAGNOSIS — Z7984 Long term (current) use of oral hypoglycemic drugs: Secondary | ICD-10-CM | POA: Diagnosis not present

## 2021-08-12 DIAGNOSIS — M2341 Loose body in knee, right knee: Secondary | ICD-10-CM | POA: Insufficient documentation

## 2021-08-12 DIAGNOSIS — X58XXXA Exposure to other specified factors, initial encounter: Secondary | ICD-10-CM | POA: Insufficient documentation

## 2021-08-12 DIAGNOSIS — M1711 Unilateral primary osteoarthritis, right knee: Secondary | ICD-10-CM | POA: Diagnosis not present

## 2021-08-12 DIAGNOSIS — Z7982 Long term (current) use of aspirin: Secondary | ICD-10-CM | POA: Diagnosis not present

## 2021-08-12 DIAGNOSIS — Z8616 Personal history of COVID-19: Secondary | ICD-10-CM | POA: Diagnosis not present

## 2021-08-12 DIAGNOSIS — S83281A Other tear of lateral meniscus, current injury, right knee, initial encounter: Secondary | ICD-10-CM | POA: Diagnosis present

## 2021-08-12 DIAGNOSIS — Z79899 Other long term (current) drug therapy: Secondary | ICD-10-CM | POA: Insufficient documentation

## 2021-08-12 HISTORY — DX: Unspecified osteoarthritis, unspecified site: M19.90

## 2021-08-12 HISTORY — DX: Obstructive sleep apnea (adult) (pediatric): G47.33

## 2021-08-12 HISTORY — DX: Benign prostatic hyperplasia with lower urinary tract symptoms: N40.1

## 2021-08-12 HISTORY — DX: Iron deficiency anemia, unspecified: D50.9

## 2021-08-12 HISTORY — PX: KNEE ARTHROSCOPY WITH LATERAL MENISECTOMY: SHX6193

## 2021-08-12 LAB — CBC
HCT: 45.3 % (ref 39.0–52.0)
Hemoglobin: 15 g/dL (ref 13.0–17.0)
MCH: 28.6 pg (ref 26.0–34.0)
MCHC: 33.1 g/dL (ref 30.0–36.0)
MCV: 86.5 fL (ref 80.0–100.0)
Platelets: 226 10*3/uL (ref 150–400)
RBC: 5.24 MIL/uL (ref 4.22–5.81)
RDW: 12.8 % (ref 11.5–15.5)
WBC: 6.8 10*3/uL (ref 4.0–10.5)
nRBC: 0 % (ref 0.0–0.2)

## 2021-08-12 LAB — COMPREHENSIVE METABOLIC PANEL
ALT: 23 U/L (ref 0–44)
AST: 20 U/L (ref 15–41)
Albumin: 4.4 g/dL (ref 3.5–5.0)
Alkaline Phosphatase: 75 U/L (ref 38–126)
Anion gap: 12 (ref 5–15)
BUN: 16 mg/dL (ref 8–23)
CO2: 23 mmol/L (ref 22–32)
Calcium: 9.3 mg/dL (ref 8.9–10.3)
Chloride: 102 mmol/L (ref 98–111)
Creatinine, Ser: 0.84 mg/dL (ref 0.61–1.24)
GFR, Estimated: >60 mL/min (ref >60)
Glucose, Bld: 113 mg/dL — ABNORMAL HIGH (ref 70–99)
Potassium: 3.9 mmol/L (ref 3.5–5.1)
Sodium: 137 mmol/L (ref 135–145)
Total Bilirubin: 0.7 mg/dL (ref 0.3–1.2)
Total Protein: 7 g/dL (ref 6.5–8.1)

## 2021-08-12 LAB — GLUCOSE, CAPILLARY
Glucose-Capillary: 109 mg/dL — ABNORMAL HIGH (ref 70–99)
Glucose-Capillary: 108 mg/dL — ABNORMAL HIGH (ref 70–99)

## 2021-08-12 SURGERY — ARTHROSCOPY, KNEE, WITH LATERAL MENISCECTOMY
Anesthesia: General | Site: Knee | Laterality: Right

## 2021-08-12 MED ORDER — OXYCODONE HCL 5 MG PO TABS: 5.0000 mg | ORAL_TABLET | Freq: Once | ORAL | Status: DC | PRN

## 2021-08-12 MED ORDER — CEFAZOLIN SODIUM-DEXTROSE 2-4 GM/100ML-% IV SOLN
2.0000 g | INTRAVENOUS | Status: AC
  Administered 2021-08-12: 2 g via INTRAVENOUS

## 2021-08-12 MED ORDER — ONDANSETRON HCL 4 MG/2ML IJ SOLN
INTRAMUSCULAR | Status: DC | PRN
  Administered 2021-08-12: 4 mg via INTRAVENOUS

## 2021-08-12 MED ORDER — SODIUM CHLORIDE 0.9 % IR SOLN
Status: DC | PRN
  Administered 2021-08-12: 6000 mL

## 2021-08-12 MED ORDER — MIDAZOLAM HCL 2 MG/2ML IJ SOLN
INTRAMUSCULAR | Status: AC
  Filled 2021-08-12: qty 2

## 2021-08-12 MED ORDER — LIDOCAINE 2% (20 MG/ML) 5 ML SYRINGE
INTRAMUSCULAR | Status: DC | PRN
  Administered 2021-08-12: 40 mg via INTRAVENOUS

## 2021-08-12 MED ORDER — BUPIVACAINE HCL 0.25 % IJ SOLN
INTRAMUSCULAR | Status: DC | PRN
  Administered 2021-08-12: 20 mL

## 2021-08-12 MED ORDER — LACTATED RINGERS IV SOLN
INTRAVENOUS | Status: DC
  Administered 2021-08-12: 1000 mL via INTRAVENOUS

## 2021-08-12 MED ORDER — OXYCODONE HCL 5 MG PO TABS: 5.0000 mg | ORAL_TABLET | ORAL | 0 refills | Status: AC | PRN

## 2021-08-12 MED ORDER — METHOCARBAMOL 500 MG PO TABS: 500.0000 mg | ORAL_TABLET | Freq: Four times a day (QID) | ORAL | 0 refills | Status: DC

## 2021-08-12 MED ORDER — TRIAMCINOLONE ACETONIDE 40 MG/ML IJ SUSP
80.0000 mg | Freq: Once | INTRAMUSCULAR | Status: DC
  Filled 2021-08-12: qty 2

## 2021-08-12 MED ORDER — ONDANSETRON HCL 4 MG/2ML IJ SOLN: 4.0000 mg | Freq: Once | INTRAMUSCULAR | Status: DC | PRN

## 2021-08-12 MED ORDER — PROPOFOL 10 MG/ML IV BOLUS
INTRAVENOUS | Status: DC | PRN
  Administered 2021-08-12: 200 mg via INTRAVENOUS

## 2021-08-12 MED ORDER — FENTANYL CITRATE (PF) 100 MCG/2ML IJ SOLN: 25.0000 ug | INTRAMUSCULAR | Status: DC | PRN

## 2021-08-12 MED ORDER — BUPIVACAINE-EPINEPHRINE (PF) 0.5% -1:200000 IJ SOLN
INTRAMUSCULAR | Status: DC | PRN
  Administered 2021-08-12: 20 mL via PERINEURAL

## 2021-08-12 MED ORDER — FENTANYL CITRATE (PF) 100 MCG/2ML IJ SOLN
INTRAMUSCULAR | Status: AC
  Filled 2021-08-12: qty 2

## 2021-08-12 MED ORDER — CEFAZOLIN SODIUM-DEXTROSE 2-4 GM/100ML-% IV SOLN
INTRAVENOUS | Status: AC
  Filled 2021-08-12: qty 100

## 2021-08-12 MED ORDER — OXYCODONE HCL 5 MG/5ML PO SOLN
5.0000 mg | Freq: Once | ORAL | Status: DC | PRN
Start: 2021-08-12 — End: 2021-08-12

## 2021-08-12 MED ORDER — ONDANSETRON HCL 4 MG/2ML IJ SOLN
INTRAMUSCULAR | Status: AC
  Filled 2021-08-12: qty 2

## 2021-08-12 MED ORDER — PROPOFOL 10 MG/ML IV BOLUS
INTRAVENOUS | Status: AC
  Filled 2021-08-12: qty 20

## 2021-08-12 MED ORDER — DEXAMETHASONE SODIUM PHOSPHATE 10 MG/ML IJ SOLN
INTRAMUSCULAR | Status: AC
  Filled 2021-08-12: qty 1

## 2021-08-12 MED ORDER — CEPHALEXIN 500 MG PO CAPS: 500.0000 mg | ORAL_CAPSULE | Freq: Four times a day (QID) | ORAL | 0 refills | Status: AC

## 2021-08-12 MED ORDER — DEXAMETHASONE SODIUM PHOSPHATE 10 MG/ML IJ SOLN
INTRAMUSCULAR | Status: DC | PRN
  Administered 2021-08-12: 5 mg via INTRAVENOUS

## 2021-08-12 MED ORDER — ONDANSETRON HCL 4 MG PO TABS: 4.0000 mg | ORAL_TABLET | Freq: Every day | ORAL | 1 refills | Status: AC | PRN

## 2021-08-12 MED ORDER — TRIAMCINOLONE ACETONIDE 40 MG/ML IJ SUSP: INTRAMUSCULAR | Status: DC | PRN

## 2021-08-12 MED ORDER — FENTANYL CITRATE (PF) 100 MCG/2ML IJ SOLN
INTRAMUSCULAR | Status: DC | PRN
  Administered 2021-08-12 (×2): 50 ug via INTRAVENOUS

## 2021-08-12 SURGICAL SUPPLY — 45 items
BANDAGE ESMARK 6X9 LF (GAUZE/BANDAGES/DRESSINGS) ×1 IMPLANT
BLADE EXCALIBUR 4.0X13 (MISCELLANEOUS) ×2 IMPLANT
BNDG CMPR 9X6 STRL LF SNTH (GAUZE/BANDAGES/DRESSINGS) ×1
BNDG ESMARK 6X9 LF (GAUZE/BANDAGES/DRESSINGS) ×2
BNDG GAUZE ELAST 4 BULKY (GAUZE/BANDAGES/DRESSINGS) ×2 IMPLANT
CANISTER SUCT 1200ML W/VALVE (MISCELLANEOUS) ×2 IMPLANT
CNTNR URN SCR LID CUP LEK RST (MISCELLANEOUS) IMPLANT
CONT SPEC 4OZ STRL OR WHT (MISCELLANEOUS)
CUFF TOURN SGL QUICK 34 (TOURNIQUET CUFF) ×4
CUFF TRNQT CYL 34X4.125X (TOURNIQUET CUFF) ×2 IMPLANT
DRAPE ARTHROSCOPY W/POUCH 114 (DRAPES) ×2 IMPLANT
DRAPE INCISE IOBAN 66X45 STRL (DRAPES) ×2 IMPLANT
DRAPE U-SHAPE 47X51 STRL (DRAPES) ×2 IMPLANT
DRSG PAD ABDOMINAL 8X10 ST (GAUZE/BANDAGES/DRESSINGS) ×2 IMPLANT
DURAPREP 26ML APPLICATOR (WOUND CARE) ×2 IMPLANT
DW OUTFLOW CASSETTE/TUBE SET (MISCELLANEOUS) ×2 IMPLANT
ELECT MENISCUS 165MM 90D (ELECTRODE) ×2 IMPLANT
EXCALIBUR 3.8MM X 13CM (MISCELLANEOUS) ×2 IMPLANT
GAUZE 4X4 16PLY ~~LOC~~+RFID DBL (SPONGE) ×2 IMPLANT
GAUZE SPONGE 4X4 12PLY STRL (GAUZE/BANDAGES/DRESSINGS) ×2 IMPLANT
GAUZE SPONGE 4X4 12PLY STRL LF (GAUZE/BANDAGES/DRESSINGS) ×2 IMPLANT
GAUZE XEROFORM 1X8 LF (GAUZE/BANDAGES/DRESSINGS) ×2 IMPLANT
GLOVE SRG 8 PF TXTR STRL LF DI (GLOVE) ×1 IMPLANT
GLOVE SURG ENC MOIS LTX SZ7 (GLOVE) ×2 IMPLANT
GLOVE SURG ENC MOIS LTX SZ8 (GLOVE) ×2 IMPLANT
GLOVE SURG UNDER POLY LF SZ7.5 (GLOVE) ×2 IMPLANT
GLOVE SURG UNDER POLY LF SZ8 (GLOVE) ×2
GOWN STRL REUS W/TWL XL LVL3 (GOWN DISPOSABLE) ×4 IMPLANT
IMMOBILIZER KNEE 22 UNIV (SOFTGOODS) ×2 IMPLANT
IV NS IRRIG 3000ML ARTHROMATIC (IV SOLUTION) ×4 IMPLANT
KIT TURNOVER CYSTO (KITS) ×2 IMPLANT
MANIFOLD NEPTUNE II (INSTRUMENTS) ×2 IMPLANT
NEEDLE HYPO 22GX1.5 SAFETY (NEEDLE) ×2 IMPLANT
PACK ARTHROSCOPY DSU (CUSTOM PROCEDURE TRAY) ×2 IMPLANT
PACK BASIN DAY SURGERY FS (CUSTOM PROCEDURE TRAY) ×2 IMPLANT
PAD ARMBOARD 7.5X6 YLW CONV (MISCELLANEOUS) IMPLANT
SUT ETHILON 4 0 PS 2 18 (SUTURE) ×2 IMPLANT
SUT VIC AB 0 CT2 27 (SUTURE) ×2 IMPLANT
SYR CONTROL 10ML LL (SYRINGE) ×2 IMPLANT
TOWEL OR 17X26 10 PK STRL BLUE (TOWEL DISPOSABLE) ×4 IMPLANT
TUBE CONNECTING 12X1/4 (SUCTIONS) ×2 IMPLANT
TUBING ARTHROSCOPY IRRIG 16FT (MISCELLANEOUS) ×2 IMPLANT
WAND APOLLORF SJ50 AR-9845 (SURGICAL WAND) ×2 IMPLANT
WATER STERILE IRR 500ML POUR (IV SOLUTION) ×2 IMPLANT
WRAP KNEE MAXI GEL POST OP (GAUZE/BANDAGES/DRESSINGS) ×2 IMPLANT

## 2021-08-12 NOTE — Progress Notes (Signed)
Assisted Dr. Brock with right, ultrasound guided, adductor canal block. Side rails up, monitors on throughout procedure. See vital signs in flow sheet. Tolerated Procedure well.  

## 2021-08-12 NOTE — Interval H&P Note (Signed)
History and Physical Interval Note:  08/12/2021 1:32 PM  Patrick Cooley  has presented today for surgery, with the diagnosis of Right knee torn lateral meniscus,loose bodies, osteoarthritis.  The various methods of treatment have been discussed with the patient and family. After consideration of risks, benefits and other options for treatment, the patient has consented to  Procedure(s) with comments: KNEE ARTHROSCOPY WITH PARTIAL LATERAL MENISECTOMY, CHONDROPLASTY, REMOVAL LOOSE BODIES (Right) - plus adductor and knee block  as a surgical intervention.  The patient's history has been reviewed, patient examined, no change in status, stable for surgery.  I have reviewed the patient's chart and labs.  Questions were answered to the patient's satisfaction.     [name] ANDREW

## 2021-08-12 NOTE — Transfer of Care (Signed)
Immediate Anesthesia Transfer of Care Note  Patient: Patrick Cooley  Procedure(s) Performed: KNEE ARTHROSCOPY WITH PARTIAL LATERAL, AND MED MENISECTOMY, CHONDROPLASTY, REMOVAL LOOSE BODIES (Right: Knee)  Patient Location: PACU  Anesthesia Type:General and Regional  Level of Consciousness: awake, alert  and oriented  Airway & Oxygen Therapy: Patient Spontanous Breathing and Patient connected to face mask oxygen  Post-op Assessment: Report given to RN and Post -op Vital signs reviewed and stable  Post vital signs: Reviewed and stable  Last Vitals:  Vitals Value Taken Time  BP 161/85 08/12/21 1452  Temp 36.7 C 08/12/21 1452  Pulse 88 08/12/21 1453  Resp 13 08/12/21 1453  SpO2 98 % 08/12/21 1453  Vitals shown include unvalidated device data.  Last Pain:  Vitals:   08/12/21 1245  TempSrc:   PainSc: 0-No pain      Patients Stated Pain Goal: 8 (08/12/21 1245)  Complications: No notable events documented.

## 2021-08-12 NOTE — H&P (Signed)
Patrick Cooley is an 65 y.o. male.   Chief Complaint: Right knee pain HPI: This is a pleasant 65 year old male who has been having issues with his right knee for over about 3 years now.  He states that he has difficulty getting down for with his grandchildren.  Pain is looking the lateral aspect of the knee.  Denies any numbness or tingling.  An MRI scan revealed a lateral meniscal tear as well as loose bodies in the knee.  Past Medical History:  Diagnosis Date   Acute torn meniscus of knee, right, sequela    loose bodies also   Allergic rhinitis    Anxiety    Benign localized prostatic hyperplasia with lower urinary tract symptoms (LUTS)    urologist--- dr Elvera Lennox. [facility]   Bladder outlet obstruction    DM type 2 (diabetes mellitus, type 2) (HCC)    followed by pcp   (08-07-2021 per pt checks blood dialy in am,  fasting sugar-- 114--125)   GERD (gastroesophageal reflux disease)    History of COVID-19 05/2021   per pt positive home test , mild to moderate symptoms,  all symptoms resolved with exception occasional dry cough but could be from allergies   Hyperlipemia    IDA (iron deficiency anemia)    on iron po   OA (osteoarthritis)    OSA on CPAP    PER PT USES NIGHTLY   Tightness in chest    occ with exertion riding mountain bike;  pt referred to cardiology for evaluation ,  dr g. Jacques Navy , lov note in epic 07-15-2021 (nuclear stress test 06-02-2021 (epic) showed low risk with no ischemia, nuclear ef 50%, hypertensive response with exercise) and echo in care everywhere 07-05-2020 ef 55-60% mild MR   Wears glasses     Past Surgical History:  Procedure Laterality Date   APPENDECTOMY  1981   open   COLONSCOPY  2019   HEMORROIDECTOMY  age 35   INGUINAL HERNIA REPAIR Bilateral 2010   NASAL SINUS SURGERY  yrs ago   TONSILLECTOMY  as child   UMBILICAL HERNIA REPAIR  2016    History reviewed. No pertinent family history. Social History:  reports that he has never smoked. He has never used  smokeless tobacco. He reports current alcohol use. He reports that he does not use drugs.  Allergies: No Known Allergies  Medications Prior to Admission  Medication Sig Dispense Refill   alfuzosin (UROXATRAL) 10 MG 24 hr tablet Take 10 mg by mouth daily with breakfast.     aspirin EC 81 MG tablet Take 81 mg by mouth at bedtime. Swallow whole.     atorvastatin (LIPITOR) 20 MG tablet Take 20 mg by mouth daily.     cetirizine (ZYRTEC) 10 MG tablet Take 10 mg by mouth daily.     Cholecalciferol (VITAMIN D3 PO) Take by mouth daily.     Cyanocobalamin (VITAMIN B 12) 500 MCG TABS Take by mouth daily.     dapagliflozin propanediol (FARXIGA) 10 MG TABS tablet Take 10 mg by mouth daily.     Dulaglutide (TRULICITY) 3 MG/0.5ML SOPN Inject 3 mg into the skin once a week. Thursday's     ferrous sulfate 325 (65 FE) MG tablet Take 325 mg by mouth every evening.     glipiZIDE (GLUCOTROL) 5 MG tablet Take 5 mg by mouth daily.     losartan (COZAAR) 25 MG tablet Take 1 tablet (25 mg total) by mouth daily. (Patient taking differently: Take 25 mg  by mouth daily.) 30 tablet 3   metFORMIN (GLUCOPHAGE) 1000 MG tablet Take 1,000 mg by mouth 2 (two) times daily with a meal.     naproxen sodium (ALEVE) 220 MG tablet Take 220 mg by mouth 2 (two) times daily as needed.     pantoprazole (PROTONIX) 40 MG tablet Take 40 mg by mouth daily.     sertraline (ZOLOFT) 50 MG tablet Take 50 mg by mouth daily.     sitaGLIPtin (JANUVIA) 100 MG tablet Take 100 mg by mouth daily.     sucralfate (CARAFATE) 1 g tablet Take 1 g by mouth 4 (four) times daily -  with meals and at bedtime.     tadalafil (CIALIS) 5 MG tablet Take 5 mg by mouth every evening.      Results for orders placed or performed during the hospital encounter of 08/12/21 (from the past 48 hour(s))  CBC     Status: None   Collection Time: 08/12/21 12:15 PM  Result Value Ref Range   WBC 6.8 4.0 - 10.5 K/uL   RBC 5.24 4.22 - 5.81 MIL/uL   Hemoglobin 15.0 13.0 - 17.0  g/dL   HCT 32.6 71.2 - 45.8 %   MCV 86.5 80.0 - 100.0 fL   MCH 28.6 26.0 - 34.0 pg   MCHC 33.1 30.0 - 36.0 g/dL   RDW 09.9 83.3 - 82.5 %   Platelets 226 150 - 400 K/uL   nRBC 0.0 0.0 - 0.2 %    Comment: Performed at [facility], [address] [place], [place] [address]  Comprehensive metabolic panel     Status: Abnormal   Collection Time: 08/12/21 12:15 PM  Result Value Ref Range   Sodium 137 135 - 145 mmol/L   Potassium 3.9 3.5 - 5.1 mmol/L   Chloride 102 98 - 111 mmol/L   CO2 23 22 - 32 mmol/L   Glucose, Bld 113 (H) 70 - 99 mg/dL    Comment: Glucose reference range applies only to samples taken after fasting for at least 8 hours.   BUN 16 8 - 23 mg/dL   Creatinine, Ser 6.73 0.61 - 1.24 mg/dL   Calcium 9.3 8.9 - 41.9 mg/dL   Total Protein 7.0 6.5 - 8.1 g/dL   Albumin 4.4 3.5 - 5.0 g/dL   AST 20 15 - 41 U/L   ALT 23 0 - 44 U/L   Alkaline Phosphatase 75 38 - 126 U/L   Total Bilirubin 0.7 0.3 - 1.2 mg/dL   GFR, Estimated >37 >90 mL/min    Comment: (NOTE) Calculated using the CKD-EPI Creatinine Equation (2021)    Anion gap 12 5 - 15    Comment: Performed at [facility], [address] [place], [place] [address]  Glucose, capillary     Status: Abnormal   Collection Time: 08/12/21 12:16 PM  Result Value Ref Range   Glucose-Capillary 109 (H) 70 - 99 mg/dL    Comment: Glucose reference range applies only to samples taken after fasting for at least 8 hours.   No results found.  Review of Systems  All other systems reviewed and are negative.  Blood pressure 124/79, pulse 73, temperature 98.6 F (37 C), temperature source Oral, resp. rate 15, height 5\' 5"  (1.651 m), weight 92 kg, SpO2 99 %. Physical Exam Constitutional:      Appearance: Normal appearance.  HENT:     Head: Normocephalic and atraumatic.  Musculoskeletal:     Comments: On examination  today he walks with a mild limp on the right side. Right foot negative. Pivot is  negative. Range of motion is 3 to about 130. Cruciate and collateral ligaments are stable. Popliteal fossa is benign. Positive lateral McMurray's. Mild knee medial joint line tenderness. He has also tenderness to patellar region.  Skin:    General: Skin is warm and dry.     Capillary Refill: Capillary refill takes less than 2 seconds.  Neurological:     General: No focal deficit present.     Mental Status: He is alert and oriented to person, place, and time.  Psychiatric:        Mood and Affect: Mood normal.        Behavior: Behavior normal.        Thought Content: Thought content normal.        Judgment: Judgment normal.     Assessment/Plan Right lateral meniscal tear: Patient was seen in the office and MRI scan was gone over the patient.  He is a right knee lateral meniscal tear, loose bodies and osteoarthritis.  He is consented for a right knee arthroscopy partial lateral meniscectomy, chondroplasty removal of loose bodies.  Risks and benefits of surgery were discussed with the patient.  Surgical versus nonsurgical management was discussed.  He has failed conservative management at this time.  All questions encouraged and answered for the patient today.  Cherie Dark, PA 08/12/2021, 1:23 PM

## 2021-08-12 NOTE — Anesthesia Postprocedure Evaluation (Signed)
Anesthesia Post Note  Patient: Patrick Cooley  Procedure(s) Performed: KNEE ARTHROSCOPY WITH PARTIAL LATERAL, AND MED MENISECTOMY, CHONDROPLASTY, REMOVAL LOOSE BODIES (Right: Knee)     Patient location during evaluation: PACU Anesthesia Type: General Level of consciousness: awake and alert Pain management: pain level controlled Vital Signs Assessment: post-procedure vital signs reviewed and stable Respiratory status: spontaneous breathing, nonlabored ventilation and respiratory function stable Cardiovascular status: stable and blood pressure returned to baseline Anesthetic complications: no   No notable events documented.  Last Vitals:  Vitals:   08/12/21 1515 08/12/21 1600  BP: (!) 145/91 (!) 150/88  Pulse: 81 68  Resp: 13 16  Temp:  36.7 C  SpO2: 96% 99%    Last Pain:  Vitals:   08/12/21 1600  TempSrc:   PainSc: 0-No pain                 Beryle Lathe

## 2021-08-12 NOTE — Anesthesia Procedure Notes (Signed)
Procedure Name: LMA Insertion Date/Time: 08/12/2021 1:47 PM Performed by: Marny Lowenstein, CRNA Pre-anesthesia Checklist: Patient identified, Emergency Drugs available, Suction available and Patient being monitored Patient Re-evaluated:Patient Re-evaluated prior to induction Oxygen Delivery Method: Circle system utilized Preoxygenation: Pre-oxygenation with 100% oxygen Induction Type: IV induction Ventilation: Mask ventilation without difficulty LMA: LMA inserted LMA Size: 5.0 Number of attempts: 1 Placement Confirmation: positive ETCO2 and breath sounds checked- equal and bilateral Tube secured with: Tape Dental Injury: Teeth and Oropharynx as per pre-operative assessment

## 2021-08-12 NOTE — Discharge Instructions (Addendum)
Right knee arthroscopy, PMM, PLM, chondroplasty, removal loose bodies: -Please take aspirin 81 mg twice a day 1 in the morning 1 in the evening -Okay to take Tylenol 1000 mg 2-3 times a day with pain medication -Please ice and elevate the leg -Okay to start physical therapy 1 week after surgery  -Please follow-up in the office in 2 weeks -Please take a stool softener while taking opioid pain medication -Please take a probiotic while taking antibiotics for 3 days -Okay to remove bandages in 3 days and replace with bandaids, keep incision site clean  -Okay to come out of knee immobilizer in 1 week   Post Anesthesia Home Care Instructions  Activity: Get plenty of rest for the remainder of the day. A responsible individual must stay with you for 24 hours following the procedure.  For the next 24 hours, DO NOT: -Drive a car -Advertising copywriter -Drink alcoholic beverages -Take any medication unless instructed by your physician -Make any legal decisions or sign important papers.  Meals: Start with liquid foods such as gelatin or soup. Progress to regular foods as tolerated. Avoid greasy, spicy, heavy foods. If nausea and/or vomiting occur, drink only clear liquids until the nausea and/or vomiting subsides. Call your physician if vomiting continues.  Special Instructions/Symptoms: Your throat may feel dry or sore from the anesthesia or the breathing tube placed in your throat during surgery. If this causes discomfort, gargle with warm salt water. The discomfort should disappear within 24 hours.  Regional Anesthesia Blocks  1. Numbness or the inability to move the "blocked" extremity may last from 3-48 hours after placement. The length of time depends on the medication injected and your individual response to the medication. If the numbness is not going away after 48 hours, call your surgeon.  2. The extremity that is blocked will need to be protected until the numbness is gone and the   Strength has returned. Because you cannot feel it, you will need to take extra care to avoid injury. Because it may be weak, you may have difficulty moving it or using it. You may not know what position it is in without looking at it while the block is in effect.  3. For blocks in the legs and feet, returning to weight bearing and walking needs to be done carefully. You will need to wait until the numbness is entirely gone and the strength has returned. You should be able to move your leg and foot normally before you try and bear weight or walk. You will need someone to be with you when you first try to ensure you do not fall and possibly risk injury.  4. Bruising and tenderness at the needle site are common side effects and will resolve in a few days.  5. Persistent numbness or new problems with movement should be communicated to the surgeon.

## 2021-08-12 NOTE — Anesthesia Procedure Notes (Signed)
Anesthesia Regional Block: Adductor canal block   Pre-Anesthetic Checklist: , timeout performed,  Correct Patient, Correct Site, Correct Laterality,  Correct Procedure, Correct Position, site marked,  Risks and benefits discussed,  Surgical consent,  Pre-op evaluation,  At surgeon's request and post-op pain management  Laterality: Right  Prep: chloraprep       Needles:  Injection technique: Single-shot  Needle Type: Echogenic Needle     Needle Length: 10cm  Needle Gauge: 21     Additional Needles:   Narrative:  Start time: 08/12/2021 12:42 PM End time: 08/12/2021 12:45 PM Injection made incrementally with aspirations every 5 mL.  Performed by: Personally  Anesthesiologist: Beryle Lathe, MD  Additional Notes: No pain on injection. No increased resistance to injection. Injection made in 5cc increments. Good needle visualization. Patient tolerated the procedure well.

## 2021-08-12 NOTE — Anesthesia Preprocedure Evaluation (Addendum)
Anesthesia Evaluation  Patient identified by MRN, date of birth, ID band Patient awake    Reviewed: Allergy & Precautions, NPO status , Patient's Chart, lab work & pertinent test results  History of Anesthesia Complications Negative for: history of anesthetic complications  Airway Mallampati: III  TM Distance: >3 FB Neck ROM: Full    Dental  (+) Dental Advisory Given   Pulmonary sleep apnea and Continuous Positive Airway Pressure Ventilation ,    Pulmonary exam normal        Cardiovascular hypertension, Pt. on medications Normal cardiovascular exam   '22 Myoperfusion - The left ventricular ejection fraction is mildly decreased (45-54%). Nuclear stress EF calculated at 50% but visually appears to be at least 55%. Blood pressure demonstrated a hypertensive response to exercise. There was no ST segment deviation noted during stress. The study is normal. This is a low risk study.     Neuro/Psych PSYCHIATRIC DISORDERS Anxiety negative neurological ROS     GI/Hepatic Neg liver ROS, GERD  Medicated and Controlled,  Endo/Other  diabetes, Type 2, Oral Hypoglycemic Agents Obesity   Renal/GU negative Renal ROS     Musculoskeletal  (+) Arthritis , Osteoarthritis,    Abdominal   Peds  Hematology negative hematology ROS (+)   Anesthesia Other Findings   Reproductive/Obstetrics                           Anesthesia Physical Anesthesia Plan  ASA: 2  Anesthesia Plan: General   Post-op Pain Management:  Regional for Post-op pain   Induction: Intravenous  PONV Risk Score and Plan: 2 and Treatment may vary due to age or medical condition, Ondansetron, Dexamethasone and Midazolam  Airway Management Planned: LMA  Additional Equipment: None  Intra-op Plan:   Post-operative Plan: Extubation in OR  Informed Consent: I have reviewed the patients History and Physical, chart, labs and discussed the  procedure including the risks, benefits and alternatives for the proposed anesthesia with the patient or authorized representative who has indicated his/her understanding and acceptance.     Dental advisory given  Plan Discussed with: CRNA and Anesthesiologist  Anesthesia Plan Comments:        Anesthesia Quick Evaluation

## 2021-08-12 NOTE — Op Note (Signed)
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  #2 complex tear medial meniscus.  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 Surgeon Valma Cava, MD Assistant Harl Favor, PA-C Anesthesia was General with intraoperative knee block estimated blood loss minimal drains none complications none tourniquet time was 30 minutes at 300 mils mercury disposition PACU stable Operative details Patient was counseled holding a correct side identified marked signed appropriate IV started sedation block was administered IV antibiotics were given within 1 hour surgical incision time.  To the operating room placed in position general anesthesia PAS stockings were applied for DVT prophylaxis left lower extremity evaluation elevated prepped DuraPrep and draped in sterile fashion timeout done confirm the right side Esmarch inflated 300 mils mercury.  Arthroscopic portal established inferomedial inferolateral.  Arthroscopic evaluation of fluid joint with grade III and IV chondromalacia and complex forward was performed for unstable chondral flaps with a shaver.  There was a large osteochondral loose body suprapatellar pouch it was left localized there.  Medial gutters were otherwise unremarkable ACL PCL intact medial part inspected articular position grade II chondromalacia like complex when shaver but stable base complex tear in the posterior horn medial meniscus using basket motorized shaver cautery a partial medial meniscectomy was performed back to stable base.  Lateral compartment was inspected grade II chondromalacia complex was performed for unstable chondral flaps  with a shaver.  Complex tear in the posterior one third lateral meniscus.  Basket motorized shaver cautery a partial meniscectomy was performed back to stable base probably beveled and contoured.  These placed in full extension tension was directed suprapatellar pouch 18-gauge spinal needle was placed percutaneously into the osteochondral despite holding the position.  A small lateral arthrotomy was formed through the skin subtenons tissue through the joint capsule itself the large osteochondroma exposed it was grasped with a grasping forceps and removed in its entirety.  There is no other lesions or loose bodies encountered.  The arthrotomy closed with Vicryl subcu Vicryl skin loose with a nylon suture portals nylon.  Another 20 cc of 0.5 Sensorcaine placed needed in the case.  Nontarget deflated sterile dress applied taken the operating PACU stable condition.  To be stabilized PACU discharged home prognosis is good I would recommend consideration of viscosupplementation at this if he has any further problems lastly if this is not  helping him he would be a  candidate for total joint arthroplasty.

## 2021-08-13 ENCOUNTER — Encounter (HOSPITAL_BASED_OUTPATIENT_CLINIC_OR_DEPARTMENT_OTHER): Payer: Self-pay | Admitting: Specialist

## 2021-09-20 IMAGING — MR MR ABDOMEN WO/W CM MRCP
20 of 21 series · 44 of 48 positions shown · IV contrast (gadavist)
Comparison: Abdominal ultrasound, 04/29/2021

CLINICAL DATA: Possible pneumobilia, abnormal findings of prior
ultrasound

EXAM:
MRI ABDOMEN WITHOUT AND WITH CONTRAST (INCLUDING MRCP)
TECHNIQUE: Multiplanar multisequence MR imaging of the abdomen was performed
both before and after the administration of intravenous contrast.
Heavily T2-weighted images of the biliary and pancreatic ducts were
obtained, and three-dimensional MRCP images were rendered by post
processing.
CONTRAST:  9mL GADAVIST GADOBUTROL 1 MMOL/ML IV SOLN

[Series 2: DWI · axial · 6.0mm · 1.53mm/px · z∈[-139,+134]mm · 3 of 78 slices shown (1 of 2)]
[im 1/78]
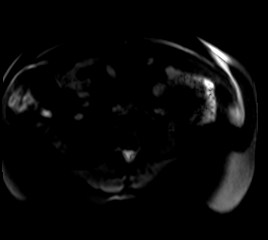
[im 39/78]
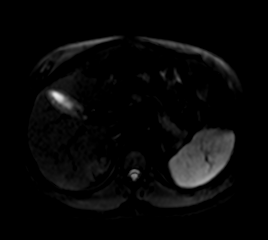
[im 78/78]
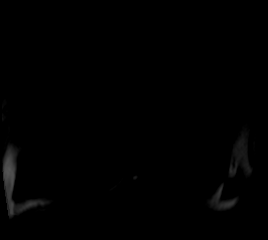

[Series 3: DWI · axial · 6.0mm · 1.53mm/px · 1 of 39 slices shown (2 of 2)]
[im 1/39]
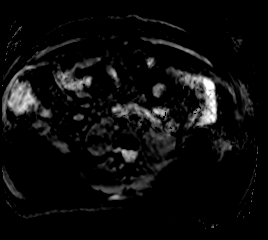

[Series 4: T2 fat-sat · axial · 6.0mm · 1.27mm/px · 1 of 40 slices shown (1 of 2)]
[im 1/40]
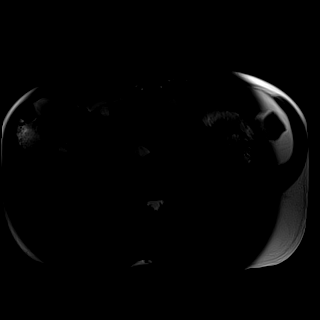

[Series 5: T2 fat-sat · 1 of 3 slices shown (2 of 2)]
[im 1/3]
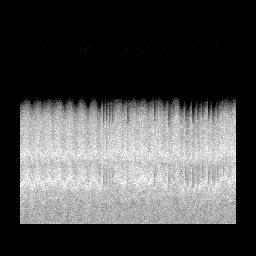

[Series 8: T2 · coronal · 6.0mm · 1.48mm/px · 1 of 43 slices shown (1 of 2)]
[im 1/43]
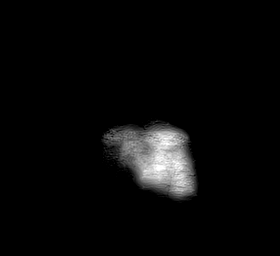

[Series 9: T1 · axial · 3.4mm · 1.25mm/px · z∈[-146,+123]mm · 3 of 80 slices shown (1 of 2)]
[im 1/80]
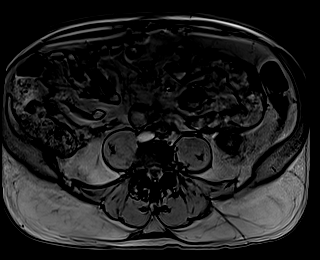
[im 40/80]
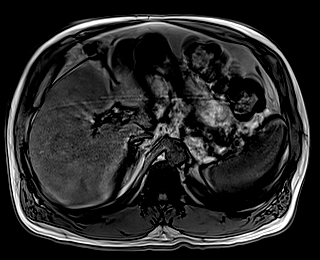
[im 80/80]
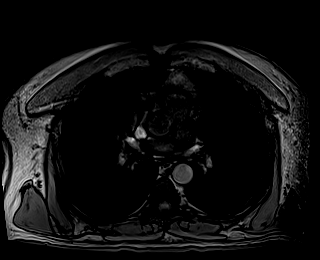

[Series 10: T1 · axial · 3.4mm · 1.25mm/px · z∈[-146,+123]mm · 3 of 80 slices shown (2 of 2)]
[im 1/80]
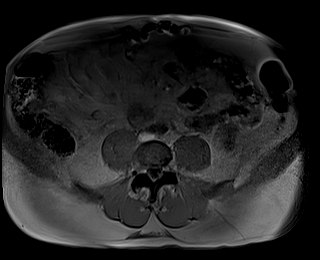
[im 40/80]
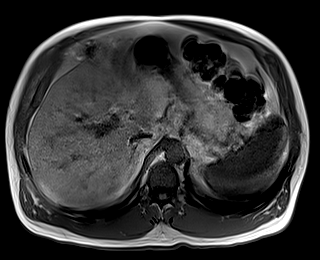
[im 80/80]
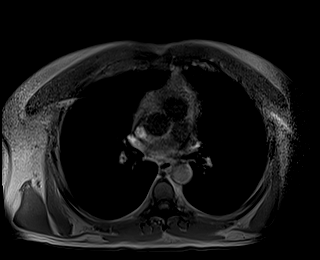

[Series 11: cor obl thk · sagittal · 50.0mm · 0.78mm/px · 1 of 9 slices shown]
[im 1/9]
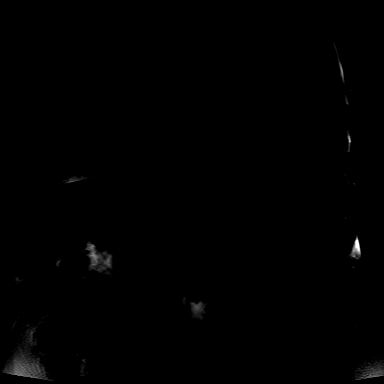

[Series 13: cor_3d_spc_trig · coronal · 1.0mm · 0.49mm/px · 2 of 72 slices shown]
[im 1/72]
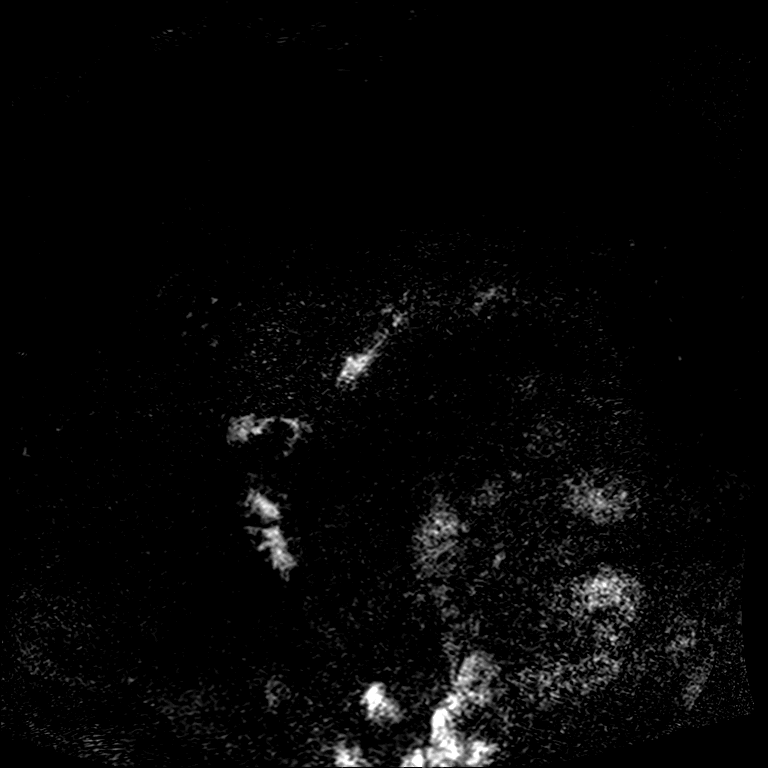
[im 72/72]
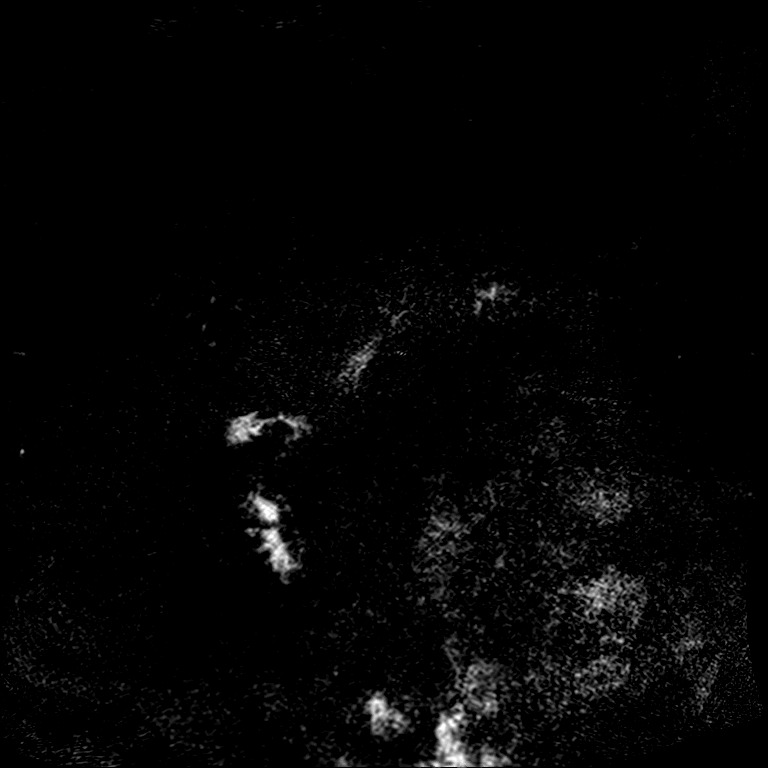

[Series 15: T2 · axial · 6.0mm · 1.56mm/px · 1 of 39 slices shown (2 of 2)]
[im 1/39]
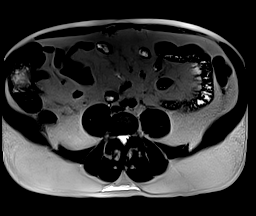

[Series 17: T1 dynamic · axial · 3.0mm · 1.25mm/px · z∈[-151,+134]mm · 3 of 96 slices shown (1 of 10)]
[im 1/96]
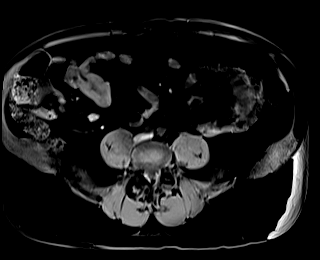
[im 48/96]
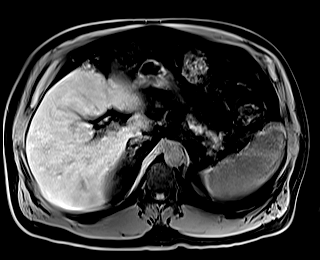
[im 96/96]
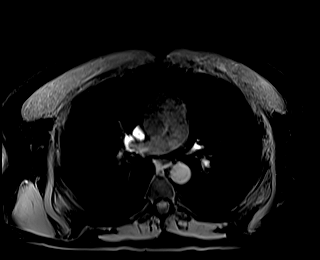

[Series 21: T1 dynamic · axial · 3.0mm · 1.25mm/px · z∈[-151,+134]mm · 3 of 96 slices shown (2 of 10)]
[im 1/96]
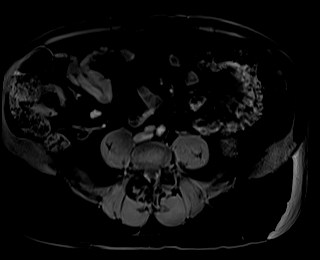
[im 48/96]
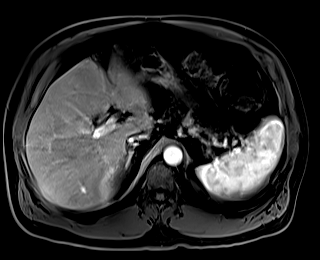
[im 96/96]
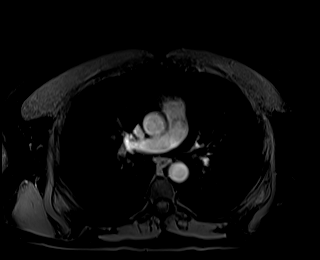

[Series 22: T1 dynamic · axial · 3.0mm · 1.25mm/px · z∈[-151,+134]mm · 3 of 96 slices shown (3 of 10)]
[im 1/96]
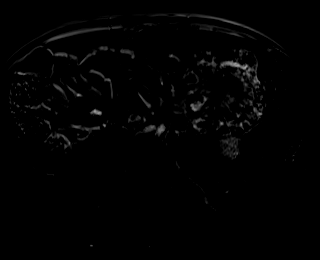
[im 48/96]
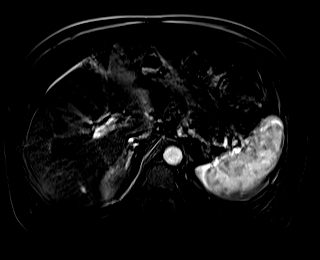
[im 96/96]
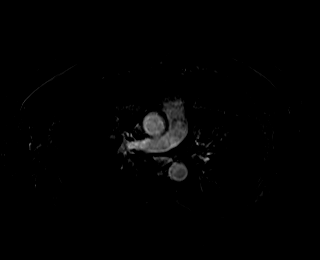

[Series 25: T1 dynamic · axial · 3.0mm · 1.25mm/px · z∈[-151,+134]mm · 3 of 96 slices shown (4 of 10)]
[im 1/96]
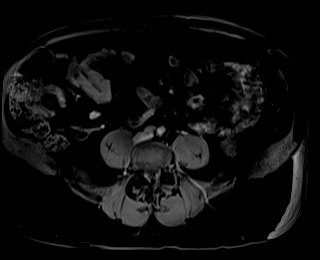
[im 48/96]
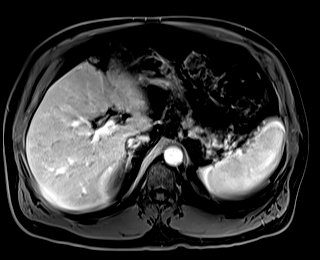
[im 96/96]
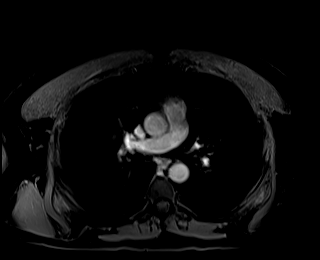

[Series 26: T1 dynamic · axial · 3.0mm · 1.25mm/px · z∈[-151,+134]mm · 3 of 96 slices shown (5 of 10)]
[im 1/96]
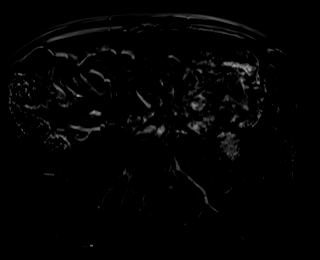
[im 48/96]
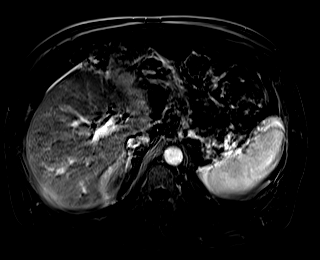
[im 96/96]
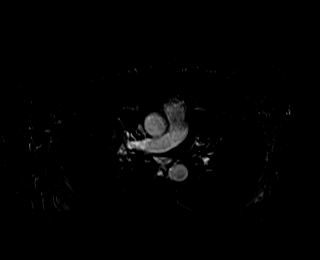

[Series 29: T1 dynamic · axial · 3.0mm · 1.25mm/px · z∈[-151,+134]mm · 3 of 96 slices shown (6 of 10)]
[im 1/96]
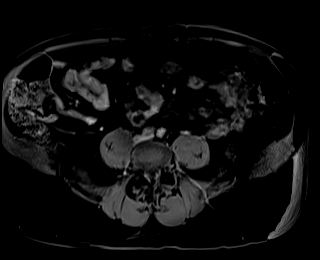
[im 48/96]
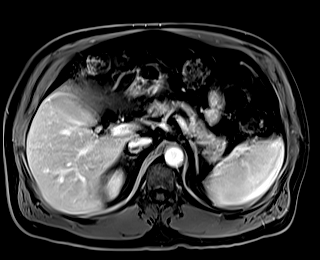
[im 96/96]
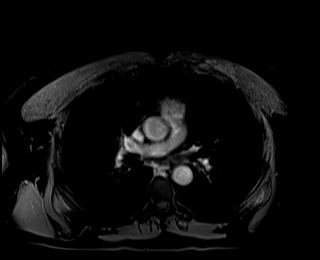

[Series 30: T1 dynamic · axial · 3.0mm · 1.25mm/px · z∈[-151,+134]mm · 3 of 96 slices shown (7 of 10)]
[im 1/96]
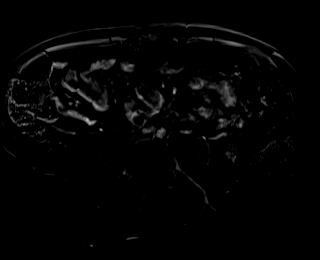
[im 48/96]
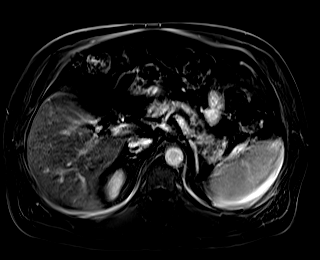
[im 96/96]
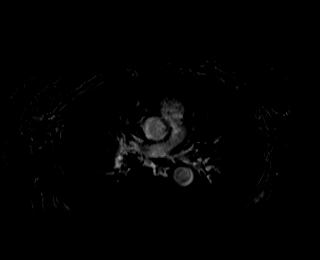

[Series 32: T1 dynamic · coronal · 5.0mm · 1.41mm/px · 2 of 56 slices shown (8 of 10)]
[im 1/56]
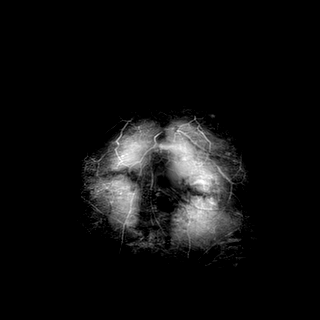
[im 56/56]
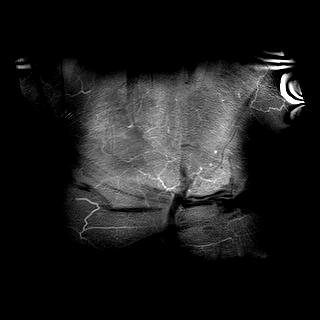

[Series 35: T1 dynamic · axial · 3.0mm · 1.25mm/px · z∈[-151,+134]mm · 3 of 96 slices shown (9 of 10)]
[im 1/96]
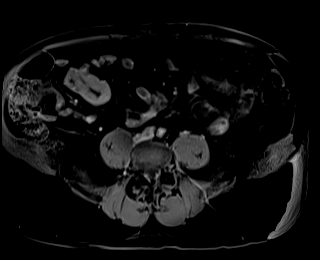
[im 48/96]
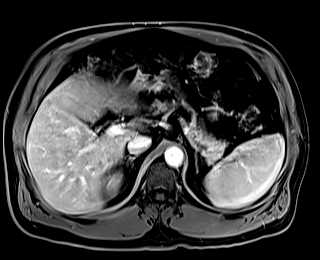
[im 96/96]
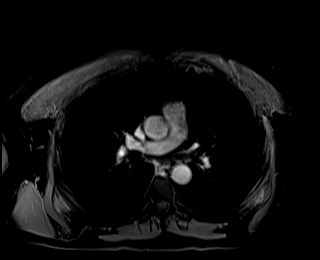

[Series 36: T1 dynamic · axial · 3.0mm · 1.25mm/px · 1 of 96 slices shown (10 of 10)]
[im 1/96]
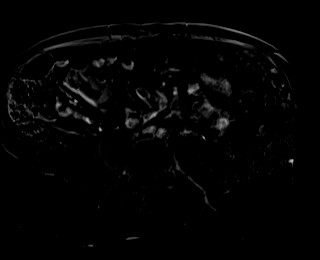

[44 of 48 positions shown; findings below may reference images not displayed]

FINDINGS: Lower chest: No acute findings.

Hepatobiliary: Hepatic steatosis. Subcentimeter, benign flash
filling hemangioma of the anterior liver dome (series 4, image 10).
No mass or other parenchymal abnormality identified. Normal
appearance of the gallbladder. No calculi. No biliary ductal
dilatation.

Pancreas: No mass, inflammatory changes, or other parenchymal
abnormality identified. No pancreatic ductal dilatation.

Spleen:  Within normal limits in size and appearance.

Adrenals/Urinary Tract: No masses identified. No evidence of
hydronephrosis.

Stomach/Bowel: Visualized portions within the abdomen are
unremarkable.

Vascular/Lymphatic: No pathologically enlarged lymph nodes
identified. No abdominal aortic aneurysm demonstrated.

Other:  None.

Musculoskeletal: No suspicious bone lesions identified.
IMPRESSION: 1. No MRI abnormality of the gallbladder or biliary system. No
gallstones. No biliary ductal dilatation. No evidence of
pneumobilia.
2. Hepatic steatosis.

## 2021-09-30 ENCOUNTER — Other Ambulatory Visit: Payer: Self-pay | Admitting: Internal Medicine

## 2021-10-08 ENCOUNTER — Telehealth: Payer: Self-pay | Admitting: Internal Medicine

## 2021-10-08 NOTE — Telephone Encounter (Signed)
*  STAT* If patient is at the pharmacy, call can be transferred to refill team.   1. Which medications need to be refilled? (please list name of each medication and dose if known)  losartan (COZAAR) 25 MG tablet  2. Which pharmacy/location (including street and city if local pharmacy) is medication to be sent to? [id number] - [place], [place] - [address]  3. Do they need a 30 day or 90 day supply?  90  Day supply

## 2021-10-13 MED ORDER — LOSARTAN POTASSIUM 25 MG PO TABS: 25.0000 mg | ORAL_TABLET | Freq: Every day | ORAL | 3 refills | Status: DC

## 2021-10-13 NOTE — Telephone Encounter (Signed)
90 Day supply escribed to [facility] [address]

## 2021-10-14 ENCOUNTER — Other Ambulatory Visit: Payer: Self-pay

## 2021-10-14 ENCOUNTER — Ambulatory Visit (INDEPENDENT_AMBULATORY_CARE_PROVIDER_SITE_OTHER): Payer: 59 | Admitting: Internal Medicine

## 2021-10-14 VITALS — Ht 65.0 in | Wt 210.6 lb

## 2021-10-14 DIAGNOSIS — E785 Hyperlipidemia, unspecified: Secondary | ICD-10-CM

## 2021-10-14 NOTE — Patient Instructions (Signed)
Medication Instructions:  No Changes In Medications at this time.  *If you need a refill on your cardiac medications before your next appointment, please call your pharmacy*  Follow-Up: At [facility], you and your health needs are our priority.  As part of our continuing mission to provide you with exceptional heart care, we have created designated Provider Care Teams.  These Care Teams include your primary Cardiologist (physician) and Advanced Practice Providers (APPs -  Physician Assistants and Nurse Practitioners) who all work together to provide you with the care you need, when you need it.  Your next appointment:   1 year(s)  The format for your next appointment:   In Person  Provider: Dr. Jacques Navy

## 2021-10-14 NOTE — Progress Notes (Signed)
Cardiology Office Note:    Date:  10/14/2021   ID:  Patrick Cooley, DOB [birth year], MRN [id number]  PCP:  Patrick Hazel, MD  Cardiologist:  None  Electrophysiologist:  None   Referring MD: Patrick Hazel, MD   Chief Complaint/Reason for Referral: Chest pressure  History of Present Illness:    Patrick Cooley is a 65 y.o. male with a history of anemia, anxiety, arthritis, DM type 2, GERD, hyperlipemia, sleep apnea (with CPAP) who presents today for initial evaluation and management of chest pressure.   Doing well overall. Recovering from recent respiratory illnesses but has not been as active as before. We discussed that with reassuring testing and BP control, safe to begin exercising and should contact our office with concerning symptoms. The patient denies chest pain, chest pressure, dyspnea at rest or with exertion, palpitations, PND, orthopnea, or leg swelling. Denies cough, fever, chills. Denies nausea, vomiting. Denies syncope or presyncope. Denies dizziness or lightheadedness.   Past Medical History:  Diagnosis Date   Acute torn meniscus of knee, right, sequela    loose bodies also   Allergic rhinitis    Anxiety    Benign localized prostatic hyperplasia with lower urinary tract symptoms (LUTS)    urologist--- dr Patrick Cooley. [facility]   Bladder outlet obstruction    DM type 2 (diabetes mellitus, type 2) (HCC)    followed by pcp   (08-07-2021 per pt checks blood dialy in am,  fasting sugar-- 114--125)   GERD (gastroesophageal reflux disease)    History of COVID-19 05/2021   per pt positive home test , mild to moderate symptoms,  all symptoms resolved with exception occasional dry cough but could be from allergies   Hyperlipemia    IDA (iron deficiency anemia)    on iron po   OA (osteoarthritis)    OSA on CPAP    PER PT USES NIGHTLY   Tightness in chest    occ with exertion riding mountain bike;  pt referred to cardiology for evaluation ,  dr g. Patrick Cooley , lov note in epic 07-15-2021 (nuclear  stress test 06-02-2021 (epic) showed low risk with no ischemia, nuclear ef 50%, hypertensive response with exercise) and echo in care everywhere 07-05-2020 ef 55-60% mild MR   Wears glasses     Past Surgical History:  Procedure Laterality Date   APPENDECTOMY  1981   open   COLONSCOPY  2019   HEMORROIDECTOMY  age 22   INGUINAL HERNIA REPAIR Bilateral 2010   KNEE ARTHROSCOPY WITH LATERAL MENISECTOMY Right 08/12/2021   Procedure: KNEE ARTHROSCOPY WITH PARTIAL LATERAL, AND MED MENISECTOMY, CHONDROPLASTY, REMOVAL LOOSE BODIES;  Surgeon: Patrick Mcalpine, MD;  Location: [facility] [place];  Service: Orthopedics;  Laterality: Right;  plus adductor and knee block    NASAL SINUS SURGERY  yrs ago   TONSILLECTOMY  as child   UMBILICAL HERNIA REPAIR  2016    Current Medications: Current Meds  Medication Sig   alfuzosin (UROXATRAL) 10 MG 24 hr tablet Take 10 mg by mouth daily with breakfast.   amoxicillin (AMOXIL) 875 MG tablet 1 tablet   aspirin EC 81 MG tablet Take 81 mg by mouth at bedtime. Swallow whole.   atorvastatin (LIPITOR) 20 MG tablet Take 20 mg by mouth daily.   cetirizine (ZYRTEC) 10 MG tablet Take 10 mg by mouth daily.   Cholecalciferol (VITAMIN D3 PO) Take by mouth daily.   Cyanocobalamin (VITAMIN B 12) 500 MCG TABS Take by mouth daily.   dapagliflozin propanediol (FARXIGA)  10 MG TABS tablet Take 10 mg by mouth daily.   Dulaglutide (TRULICITY) 3 MG/0.5ML SOPN Inject 3 mg into the skin once a week. Thursday's   ferrous sulfate 325 (65 FE) MG tablet Take 325 mg by mouth every evening.   glipiZIDE (GLUCOTROL) 5 MG tablet Take 5 mg by mouth daily.   losartan (COZAAR) 25 MG tablet Take 1 tablet (25 mg total) by mouth daily.   metFORMIN (GLUCOPHAGE) 1000 MG tablet Take 1,000 mg by mouth 2 (two) times daily with a meal.   methocarbamol (ROBAXIN) 500 MG tablet Take 1 tablet (500 mg total) by mouth 4 (four) times daily.   naproxen sodium (ALEVE) 220 MG tablet Take 220 mg by  mouth 2 (two) times daily as needed.   pantoprazole (PROTONIX) 40 MG tablet Take 40 mg by mouth daily.   sertraline (ZOLOFT) 50 MG tablet Take 50 mg by mouth daily.   sitaGLIPtin (JANUVIA) 100 MG tablet Take 100 mg by mouth daily.   sucralfate (CARAFATE) 1 g tablet Take 1 g by mouth 4 (four) times daily -  with meals and at bedtime.   tadalafil (CIALIS) 5 MG tablet Take 5 mg by mouth every evening.     Allergies:   Patient has no known allergies.   Social History   Tobacco Use   Smoking status: Never   Smokeless tobacco: Never  Vaping Use   Vaping Use: Never used  Substance Use Topics   Alcohol use: Yes    Comment: OCCASIONAL   Drug use: Never     Family History: The patient's family history is not on file.  ROS:   Please see the history of present illness.    All other systems reviewed and are negative.  EKGs/Labs/Other Studies Reviewed:    The following studies were reviewed today: Nuc stress 06/02/21 The left ventricular ejection fraction is mildly decreased (45-54%). Nuclear stress EF calculated at 50% but visually appears to be at least 55%. Blood pressure demonstrated a hypertensive response to exercise. There was no ST segment deviation noted during stress. The study is normal. This is a low risk study.    EKG: SR first deg AVB PR 218 ms 05/13/2021: Sinus rhythm, rate 83 bpm   Recent Labs: 08/12/2021: ALT 23; BUN 16; Creatinine, Ser 0.84; Hemoglobin 15.0; Platelets 226; Potassium 3.9; Sodium 137  Recent Lipid Panel No results found for: CHOL, TRIG, HDL, CHOLHDL, VLDL, LDLCALC, LDLDIRECT  Physical Exam:    VS:  Ht 5\' 5"  (1.651 m)   Wt 210 lb 9.6 oz (95.5 kg)   SpO2 99%   BMI 35.05 kg/m     Wt Readings from Last 5 Encounters:  10/14/21 210 lb 9.6 oz (95.5 kg)  08/12/21 202 lb 14.4 oz (92 kg)  07/15/21 210 lb (95.3 kg)  06/02/21 204 lb (92.5 kg)  05/13/21 204 lb 3.2 oz (92.6 kg)    Constitutional: No acute distress Eyes: sclera non-icteric, normal  conjunctiva and lids ENMT: normal dentition, moist mucous membranes Cardiovascular: regular rhythm, normal rate, no murmurs. S1 and S2 normal. Radial pulses normal bilaterally. No jugular venous distention.  Respiratory: clear to auscultation bilaterally GI : normal bowel sounds, soft and nontender. No distention.   MSK: extremities warm, well perfused. No edema.  NEURO: grossly nonfocal exam, moves all extremities. PSYCH: alert and oriented x 3, normal mood and affect.    ASSESSMENT:    No diagnosis found.   PLAN:    Precordial pain Hypertensive response to exercise -Low  risk stress test with no ischemia, however did have a hypertensive response to exercise with 6 minutes of exercise.  I have started him on losartan 25 mg daily and he is tolerating this well.  Continue.  OSA on CPAP-continue use of CPAP  Type 2 diabetes mellitus with other specified complication, without long-term current use of insulin (HCC)-currently on metformin, may be a good candidate for resumption of Marcelline Deist if affordable.  Also on Trulicity which has helped the most.  Hyperlipidemia, unspecified hyperlipidemia type-currently on Lipitor 20 mg daily with most recent LDL 38.  Triglycerides significantly elevated at 465 when A1c was quite elevated. Follow with PCP and consider uptitration of statin, with addition of fibrate or Vascepa if no impact.   Total time of encounter: 30 minutes total time of encounter, including 20 minutes spent in face-to-face patient care on the date of this encounter. This time includes coordination of care and counseling regarding above mentioned problem list. Remainder of non-face-to-face time involved reviewing chart documents/testing relevant to the patient encounter and documentation in the medical record. I have independently reviewed documentation from referring provider.   Weston Brass, MD, [facility] [place]  [facility]    Shared Decision Making/Informed Consent:      Medication Adjustments/Labs and Tests Ordered: Current medicines are reviewed at length with the patient today.  Concerns regarding medicines are outlined above.   Orders Placed This Encounter  Procedures   EKG 12-Lead      No orders of the defined types were placed in this encounter.    Patient Instructions  Medication Instructions:  No Changes In Medications at this time.  *If you need a refill on your cardiac medications before your next appointment, please call your pharmacy*  Follow-Up: At [facility], you and your health needs are our priority.  As part of our continuing mission to provide you with exceptional heart care, we have created designated Provider Care Teams.  These Care Teams include your primary Cardiologist (physician) and Advanced Practice Providers (APPs -  Physician Assistants and Nurse Practitioners) who all work together to provide you with the care you need, when you need it.  Your next appointment:   1 year(s)  The format for your next appointment:   In Person  Provider: Dr. Jacques Cooley

## 2021-11-17 DIAGNOSIS — U071 COVID-19: Secondary | ICD-10-CM | POA: Diagnosis not present

## 2022-01-12 DIAGNOSIS — Z4789 Encounter for other orthopedic aftercare: Secondary | ICD-10-CM | POA: Diagnosis not present

## 2022-01-16 DIAGNOSIS — I10 Essential (primary) hypertension: Secondary | ICD-10-CM | POA: Diagnosis not present

## 2022-01-16 DIAGNOSIS — K219 Gastro-esophageal reflux disease without esophagitis: Secondary | ICD-10-CM | POA: Diagnosis not present

## 2022-01-16 DIAGNOSIS — E669 Obesity, unspecified: Secondary | ICD-10-CM | POA: Diagnosis not present

## 2022-01-16 DIAGNOSIS — E1169 Type 2 diabetes mellitus with other specified complication: Secondary | ICD-10-CM | POA: Diagnosis not present

## 2022-01-16 DIAGNOSIS — R69 Illness, unspecified: Secondary | ICD-10-CM | POA: Diagnosis not present

## 2022-01-16 DIAGNOSIS — N400 Enlarged prostate without lower urinary tract symptoms: Secondary | ICD-10-CM | POA: Diagnosis not present

## 2022-03-09 DIAGNOSIS — M25511 Pain in right shoulder: Secondary | ICD-10-CM | POA: Diagnosis not present

## 2022-03-09 DIAGNOSIS — M19012 Primary osteoarthritis, left shoulder: Secondary | ICD-10-CM | POA: Diagnosis not present

## 2022-03-09 DIAGNOSIS — M25512 Pain in left shoulder: Secondary | ICD-10-CM | POA: Diagnosis not present

## 2022-03-09 DIAGNOSIS — M19011 Primary osteoarthritis, right shoulder: Secondary | ICD-10-CM | POA: Diagnosis not present

## 2022-06-02 DIAGNOSIS — J069 Acute upper respiratory infection, unspecified: Secondary | ICD-10-CM | POA: Diagnosis not present

## 2022-06-02 DIAGNOSIS — Z6834 Body mass index (BMI) 34.0-34.9, adult: Secondary | ICD-10-CM | POA: Diagnosis not present

## 2022-06-02 DIAGNOSIS — R051 Acute cough: Secondary | ICD-10-CM | POA: Diagnosis not present

## 2022-06-02 DIAGNOSIS — R059 Cough, unspecified: Secondary | ICD-10-CM | POA: Diagnosis not present

## 2022-06-02 DIAGNOSIS — Z03818 Encounter for observation for suspected exposure to other biological agents ruled out: Secondary | ICD-10-CM | POA: Diagnosis not present

## 2022-07-03 ENCOUNTER — Ambulatory Visit (INDEPENDENT_AMBULATORY_CARE_PROVIDER_SITE_OTHER): Payer: Self-pay

## 2022-07-03 ENCOUNTER — Other Ambulatory Visit: Payer: Self-pay | Admitting: Physician Assistant

## 2022-07-03 DIAGNOSIS — Z021 Encounter for pre-employment examination: Secondary | ICD-10-CM

## 2022-07-11 ENCOUNTER — Encounter: Payer: Self-pay | Admitting: Internal Medicine

## 2022-07-11 DIAGNOSIS — R9431 Abnormal electrocardiogram [ECG] [EKG]: Secondary | ICD-10-CM

## 2022-07-11 DIAGNOSIS — I4510 Unspecified right bundle-branch block: Secondary | ICD-10-CM

## 2022-07-11 DIAGNOSIS — I340 Nonrheumatic mitral (valve) insufficiency: Secondary | ICD-10-CM

## 2022-07-28 ENCOUNTER — Ambulatory Visit (HOSPITAL_COMMUNITY): Payer: 59 | Attending: Internal Medicine

## 2022-07-28 DIAGNOSIS — R9431 Abnormal electrocardiogram [ECG] [EKG]: Secondary | ICD-10-CM | POA: Diagnosis not present

## 2022-07-28 LAB — ECHOCARDIOGRAM COMPLETE
Area-P 1/2: 4.44 cm2
MV M vel: 5.45 m/s
MV Peak grad: 118.8 mmHg
P 1/2 time: 811 msec
Radius: 0.65 cm
S' Lateral: 3.1 cm

## 2022-08-07 ENCOUNTER — Telehealth (HOSPITAL_COMMUNITY): Payer: Self-pay | Admitting: *Deleted

## 2022-08-07 NOTE — Telephone Encounter (Signed)
Close encounter 

## 2022-08-11 ENCOUNTER — Ambulatory Visit (HOSPITAL_COMMUNITY)
Admission: RE | Admit: 2022-08-11 | Discharge: 2022-08-11 | Disposition: A | Discharge status: Home / Self Care | Payer: 59 | Source: Ambulatory Visit | Attending: Cardiovascular Disease | Admitting: Cardiovascular Disease

## 2022-08-11 DIAGNOSIS — I440 Atrioventricular block, first degree: Secondary | ICD-10-CM | POA: Insufficient documentation

## 2022-08-11 DIAGNOSIS — I4510 Unspecified right bundle-branch block: Secondary | ICD-10-CM | POA: Diagnosis not present

## 2022-08-11 LAB — EXERCISE TOLERANCE TEST
Angina Index: 0
Duke Treadmill Score: 11
Estimated workload: 12.5
Exercise duration (min): 10 min
Exercise duration (sec): 30 s
MPHR: 156 {beats}/min
Peak HR: 169 {beats}/min
Percent HR: 108 %
Rest HR: 75 {beats}/min
ST Depression (mm): 0 mm

## 2022-08-17 DIAGNOSIS — E7800 Pure hypercholesterolemia, unspecified: Secondary | ICD-10-CM | POA: Diagnosis not present

## 2022-08-17 DIAGNOSIS — R69 Illness, unspecified: Secondary | ICD-10-CM | POA: Diagnosis not present

## 2022-08-17 DIAGNOSIS — Z0000 Encounter for general adult medical examination without abnormal findings: Secondary | ICD-10-CM | POA: Diagnosis not present

## 2022-08-17 DIAGNOSIS — Z6835 Body mass index (BMI) 35.0-35.9, adult: Secondary | ICD-10-CM | POA: Diagnosis not present

## 2022-08-17 DIAGNOSIS — Z23 Encounter for immunization: Secondary | ICD-10-CM | POA: Diagnosis not present

## 2022-08-17 DIAGNOSIS — I10 Essential (primary) hypertension: Secondary | ICD-10-CM | POA: Diagnosis not present

## 2022-08-17 DIAGNOSIS — K219 Gastro-esophageal reflux disease without esophagitis: Secondary | ICD-10-CM | POA: Diagnosis not present

## 2022-08-17 DIAGNOSIS — E1169 Type 2 diabetes mellitus with other specified complication: Secondary | ICD-10-CM | POA: Diagnosis not present

## 2022-08-17 DIAGNOSIS — G4733 Obstructive sleep apnea (adult) (pediatric): Secondary | ICD-10-CM | POA: Diagnosis not present

## 2022-08-17 DIAGNOSIS — N400 Enlarged prostate without lower urinary tract symptoms: Secondary | ICD-10-CM | POA: Diagnosis not present

## 2022-08-25 DIAGNOSIS — Z6834 Body mass index (BMI) 34.0-34.9, adult: Secondary | ICD-10-CM | POA: Diagnosis not present

## 2022-08-25 DIAGNOSIS — J0141 Acute recurrent pansinusitis: Secondary | ICD-10-CM | POA: Diagnosis not present

## 2022-09-01 DIAGNOSIS — G4733 Obstructive sleep apnea (adult) (pediatric): Secondary | ICD-10-CM | POA: Diagnosis not present

## 2022-09-01 DIAGNOSIS — E669 Obesity, unspecified: Secondary | ICD-10-CM | POA: Diagnosis not present

## 2022-10-15 DIAGNOSIS — M79671 Pain in right foot: Secondary | ICD-10-CM | POA: Diagnosis not present

## 2022-10-15 DIAGNOSIS — M722 Plantar fascial fibromatosis: Secondary | ICD-10-CM | POA: Diagnosis not present

## 2022-10-21 DIAGNOSIS — M79671 Pain in right foot: Secondary | ICD-10-CM | POA: Diagnosis not present

## 2022-10-21 DIAGNOSIS — M722 Plantar fascial fibromatosis: Secondary | ICD-10-CM | POA: Diagnosis not present

## 2022-10-22 DIAGNOSIS — M19011 Primary osteoarthritis, right shoulder: Secondary | ICD-10-CM | POA: Diagnosis not present

## 2022-10-22 DIAGNOSIS — M19012 Primary osteoarthritis, left shoulder: Secondary | ICD-10-CM | POA: Diagnosis not present

## 2022-11-30 DIAGNOSIS — J069 Acute upper respiratory infection, unspecified: Secondary | ICD-10-CM | POA: Diagnosis not present

## 2022-11-30 DIAGNOSIS — J0110 Acute frontal sinusitis, unspecified: Secondary | ICD-10-CM | POA: Diagnosis not present

## 2022-12-03 DIAGNOSIS — Z6835 Body mass index (BMI) 35.0-35.9, adult: Secondary | ICD-10-CM | POA: Diagnosis not present

## 2022-12-03 DIAGNOSIS — B9689 Other specified bacterial agents as the cause of diseases classified elsewhere: Secondary | ICD-10-CM | POA: Diagnosis not present

## 2022-12-03 DIAGNOSIS — E1169 Type 2 diabetes mellitus with other specified complication: Secondary | ICD-10-CM | POA: Diagnosis not present

## 2022-12-03 DIAGNOSIS — J019 Acute sinusitis, unspecified: Secondary | ICD-10-CM | POA: Diagnosis not present

## 2022-12-03 DIAGNOSIS — H6691 Otitis media, unspecified, right ear: Secondary | ICD-10-CM | POA: Diagnosis not present

## 2022-12-08 ENCOUNTER — Encounter: Payer: Self-pay | Admitting: Internal Medicine

## 2022-12-08 ENCOUNTER — Ambulatory Visit: Payer: PPO | Attending: Internal Medicine | Admitting: Internal Medicine

## 2022-12-08 VITALS — BP 118/72 | HR 67 | Ht 65.0 in | Wt 214.0 lb

## 2022-12-08 DIAGNOSIS — I10 Essential (primary) hypertension: Secondary | ICD-10-CM | POA: Diagnosis not present

## 2022-12-08 DIAGNOSIS — E785 Hyperlipidemia, unspecified: Secondary | ICD-10-CM | POA: Diagnosis not present

## 2022-12-08 DIAGNOSIS — R9431 Abnormal electrocardiogram [ECG] [EKG]: Secondary | ICD-10-CM | POA: Diagnosis not present

## 2022-12-08 DIAGNOSIS — I340 Nonrheumatic mitral (valve) insufficiency: Secondary | ICD-10-CM

## 2022-12-08 DIAGNOSIS — G4733 Obstructive sleep apnea (adult) (pediatric): Secondary | ICD-10-CM

## 2022-12-08 DIAGNOSIS — R072 Precordial pain: Secondary | ICD-10-CM

## 2022-12-08 DIAGNOSIS — E1169 Type 2 diabetes mellitus with other specified complication: Secondary | ICD-10-CM | POA: Diagnosis not present

## 2022-12-08 DIAGNOSIS — I4510 Unspecified right bundle-branch block: Secondary | ICD-10-CM

## 2022-12-08 NOTE — Progress Notes (Signed)
Cardiology Office Note:    Date:  12/08/2022   ID:  Patrick Cooley, DOB [birth year], MRN [id number]  PCP:  Sigmund Hazel, MD  Cardiologist:  None  Electrophysiologist:  None   Referring MD: Sigmund Hazel, MD   Chief Complaint/Reason for Referral: Follow up  History of Present Illness:    Patrick Cooley is a 67 y.o. male with a history of anemia, anxiety, arthritis, DM type 2, GERD, hyperlipemia, sleep apnea (with CPAP) who presents today for follow up.  Last visit he was recovering from a recent respiratory illnesses so he had not been as active as before. We discussed that with reassuring testing and BP control, safe to begin exercising and should contact our office with concerning symptoms.  Today, the patient states that he has been doing well. He notes no recent cardiac issues or chest discomfort. He is tolerating and is compliant with losartan which we are using for exercise hypertension. He hasn't been keeping blood pressure logs. His pressures at various doctors office visits have been similar to clinic today at 118/72, so we believe his pressure to be well controlled. He notes some orthostatic hypotension when standing resulting in lightheadedness after starting losartan. Mild DOE.  A recent lipid panel reported LDL 36 Triglycerides 200, we discussed diet and possible recheck of lipids.  He is compliant with his CPAP.  He denies any palpitations, chest pain, or peripheral edema, headaches, syncope, orthopnea, or PND.   Past Medical History:  Diagnosis Date   Acute torn meniscus of knee, right, sequela    loose bodies also   Allergic rhinitis    Anxiety    Benign localized prostatic hyperplasia with lower urinary tract symptoms (LUTS)    urologist--- dr Elvera Lennox. [facility]   Bladder outlet obstruction    DM type 2 (diabetes mellitus, type 2) (HCC)    followed by pcp   (08-07-2021 per pt checks blood dialy in am,  fasting sugar-- 114--125)   GERD (gastroesophageal reflux disease)     History of COVID-19 05/2021   per pt positive home test , mild to moderate symptoms,  all symptoms resolved with exception occasional dry cough but could be from allergies   Hyperlipemia    IDA (iron deficiency anemia)    on iron po   OA (osteoarthritis)    OSA on CPAP    PER PT USES NIGHTLY   Tightness in chest    occ with exertion riding mountain bike;  pt referred to cardiology for evaluation ,  dr g. Jacques Navy , lov note in epic 07-15-2021 (nuclear stress test 06-02-2021 (epic) showed low risk with no ischemia, nuclear ef 50%, hypertensive response with exercise) and echo in care everywhere 07-05-2020 ef 55-60% mild MR   Wears glasses     Past Surgical History:  Procedure Laterality Date   APPENDECTOMY  1981   open   COLONSCOPY  2019   HEMORROIDECTOMY  age 53   INGUINAL HERNIA REPAIR Bilateral 2010   KNEE ARTHROSCOPY WITH LATERAL MENISECTOMY Right 08/12/2021   Procedure: KNEE ARTHROSCOPY WITH PARTIAL LATERAL, AND MED MENISECTOMY, CHONDROPLASTY, REMOVAL LOOSE BODIES;  Surgeon: Eugenia Mcalpine, MD;  Location: [facility];  Service: Orthopedics;  Laterality: Right;  plus adductor and knee block    NASAL SINUS SURGERY  yrs ago   TONSILLECTOMY  as child   UMBILICAL HERNIA REPAIR  2016    Current Medications: Current Meds  Medication Sig   alfuzosin (UROXATRAL) 10 MG 24 hr tablet Take 10 mg  by mouth daily with breakfast.   amoxicillin (AMOXIL) 875 MG tablet 1 tablet   aspirin EC 81 MG tablet Take 81 mg by mouth at bedtime. Swallow whole.   atorvastatin (LIPITOR) 20 MG tablet Take 20 mg by mouth daily.   cetirizine (ZYRTEC) 10 MG tablet Take 10 mg by mouth daily.   Cholecalciferol (VITAMIN D3 PO) Take by mouth daily.   Cyanocobalamin (VITAMIN B 12) 500 MCG TABS Take by mouth daily.   dapagliflozin propanediol (FARXIGA) 10 MG TABS tablet Take 10 mg by mouth daily.   ferrous sulfate 325 (65 FE) MG tablet Take 325 mg by mouth every evening.   glipiZIDE (GLUCOTROL)  5 MG tablet Take 5 mg by mouth daily.   losartan (COZAAR) 25 MG tablet Take 1 tablet (25 mg total) by mouth daily.   metFORMIN (GLUCOPHAGE) 1000 MG tablet Take 1,000 mg by mouth 2 (two) times daily with a meal.   MOUNJARO 5 MG/0.5ML Pen Inject 5 mg into the skin once a week.   naproxen sodium (ALEVE) 220 MG tablet Take 220 mg by mouth 2 (two) times daily as needed.   pantoprazole (PROTONIX) 40 MG tablet Take 40 mg by mouth daily.   sertraline (ZOLOFT) 50 MG tablet Take 50 mg by mouth daily.   sitaGLIPtin (JANUVIA) 100 MG tablet Take 100 mg by mouth daily.   sucralfate (CARAFATE) 1 g tablet Take 1 g by mouth 4 (four) times daily -  with meals and at bedtime.   tadalafil (CIALIS) 5 MG tablet Take 5 mg by mouth every evening.     Allergies:   Patient has no known allergies.   Social History   Tobacco Use   Smoking status: Never   Smokeless tobacco: Never  Vaping Use   Vaping Use: Never used  Substance Use Topics   Alcohol use: Yes    Comment: OCCASIONAL   Drug use: Never     Family History: The patient's family history is not on file.  ROS:   Please see the history of present illness.  (+) Lightheadedness All other systems reviewed and are negative.  EKGs/Labs/Other Studies Reviewed:    The following studies were reviewed today: Nuc stress 06/02/21 The left ventricular ejection fraction is mildly decreased (45-54%). Nuclear stress EF calculated at 50% but visually appears to be at least 55%. Blood pressure demonstrated a hypertensive response to exercise. There was no ST segment deviation noted during stress. The study is normal. This is a low risk study.    EKG: The EKG is personally reviewed 12/08/2022: SR first deg AVB, RBBB, hr rate 67 10/14/2021: SR first deg AVB PR 218 ms 05/13/2021: Sinus rhythm, rate 83 bpm   Recent Labs: No results found for requested labs within last 365 days.  Recent Lipid Panel No results found for: "CHOL", "TRIG", "HDL", "CHOLHDL",  "VLDL", "LDLCALC", "LDLDIRECT"  Physical Exam:    VS:  BP 118/72   Pulse 67   Ht 5\' 5"  (1.651 m)   Wt 214 lb (97.1 kg)   SpO2 96%   BMI 35.61 kg/m     Wt Readings from Last 5 Encounters:  10/14/21 210 lb 9.6 oz (95.5 kg)  08/12/21 202 lb 14.4 oz (92 kg)  07/15/21 210 lb (95.3 kg)  06/02/21 204 lb (92.5 kg)  05/13/21 204 lb 3.2 oz (92.6 kg)    Constitutional: No acute distress Eyes: sclera non-icteric, normal conjunctiva and lids ENMT: normal dentition, moist mucous membranes Cardiovascular: regular rhythm, normal rate, 1/6 apical  systolic murmur, S1 and S2 normal. No jugular venous distention.  Respiratory: clear to auscultation bilaterally GI : normal bowel sounds, soft and nontender. No distention.   MSK: extremities warm, well perfused. No edema.  NEURO: grossly nonfocal exam, moves all extremities. PSYCH: alert and oriented x 3, normal mood and affect.    ASSESSMENT:    1. Mitral valve insufficiency, unspecified etiology   2. Precordial pain   3. Right bundle branch block   4. Nonspecific abnormal electrocardiogram (ECG) (EKG)   5. Hyperlipidemia, unspecified hyperlipidemia type   6. OSA on CPAP   7. Type 2 diabetes mellitus with other specified complication, without long-term current use of insulin (HCC)   8. Hypertensive response to exercise      PLAN:    Precordial pain Hypertensive response to exercise -Low risk stress test with no ischemia, however did have a hypertensive response to exercise with 6 minutes of exercise.  I have started him on losartan 25 mg daily and he is tolerating this well.  Continue. - repeat stress EKG treadmill test with excellent performance, no worsening of conduction system disease, and better control of BP. 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   OSA on CPAP-continue use of CPAP  Type 2 diabetes mellitus with other specified complication, without long-term current  use of insulin (HCC)-currently on metformin, may be a good candidate for resumption of Marcelline Deist if affordable.  Also on Trulicity which has helped the most.  Hyperlipidemia, unspecified hyperlipidemia type-currently on Lipitor 20 mg daily with most recent LDL 36.  Triglycerides significantly elevated at 200, which has improved, and A1C much better. Follow with PCP or me, repeat labs and consider uptitration of statin, with addition of fibrate or Vascepa if no impact. Needs to optimize triglycerides.  RBBB - new since last year but not new today. Stable, ETT stable. Continue to monitor.   MR - mild mod on last echo, repeat echo 1 year from prior.   Total time of encounter: 30 minutes total time of encounter, including 20 minutes spent in face-to-face patient care on the date of this encounter. This time includes coordination of care and counseling regarding above mentioned problem list. Remainder of non-face-to-face time involved reviewing chart documents/testing relevant to the patient encounter and documentation in the medical record. I have independently reviewed documentation from referring provider.   Weston Brass, MD, [facility] [place]  [facility]    Shared Decision Making/Informed Consent:     Medication Adjustments/Labs and Tests Ordered: Current medicines are reviewed at length with the patient today.  Concerns regarding medicines are outlined above.   Orders Placed This Encounter  Procedures   EKG 12-Lead   ECHOCARDIOGRAM COMPLETE   No orders of the defined types were placed in this encounter.  Patient Instructions  Medication Instructions:  No Changes In Medications at this time.  *If you need a refill on your cardiac medications before your next appointment, please call your pharmacy*  Lab Work: None Ordered At This Time.  If you have labs (blood work) drawn today and your tests are completely normal, you will receive your results only by: MyChart Message (if you  have MyChart) OR A paper copy in the mail If you have any lab test that is abnormal or we need to change your treatment, we will call you to review the results.  Testing/Procedures: Your physician has requested that you have an echocardiogram IN ONE  YEAR. Echocardiography is a painless test that uses sound waves to create images of your heart. It provides your doctor with information about the size and shape of your heart and how well your heart's chambers and valves are working. You may receive an ultrasound enhancing agent through an IV if needed to better visualize your heart during the echo.This procedure takes approximately one hour. There are no restrictions for this procedure. This will take place at the [address], [address].   Follow-Up: At [facility], you and your health needs are our priority.  As part of our continuing mission to provide you with exceptional heart care, we have created designated Provider Care Teams.  These Care Teams include your primary Cardiologist (physician) and Advanced Practice Providers (APPs -  Physician Assistants and Nurse Practitioners) who all work together to provide you with the care you need, when you need it.  Your next appointment:   1 year(s)  Provider:   Dr. Windle Guard O'Brien,acting as a scribe for Elouise Munroe, MD.,have documented all relevant documentation on the behalf of Elouise Munroe, MD,as directed by  Elouise Munroe, MD while in the presence of Elouise Munroe, MD.  I, Elouise Munroe, MD, have reviewed all documentation for the visit on 12/08/2022. The documentation on today's date of service for the exam, diagnosis, procedures, and orders are all accurate and complete.

## 2022-12-08 NOTE — Patient Instructions (Signed)
Medication Instructions:  No Changes In Medications at this time.  *If you need a refill on your cardiac medications before your next appointment, please call your pharmacy*  Lab Work: None Ordered At This Time.  If you have labs (blood work) drawn today and your tests are completely normal, you will receive your results only by: [place] (if you have MyChart) OR A paper copy in the mail If you have any lab test that is abnormal or we need to change your treatment, we will call you to review the results.  Testing/Procedures: Your physician has requested that you have an echocardiogram IN ONE YEAR. Echocardiography is a painless test that uses sound waves to create images of your heart. It provides your doctor with information about the size and shape of your heart and how well your heart's chambers and valves are working. You may receive an ultrasound enhancing agent through an IV if needed to better visualize your heart during the echo.This procedure takes approximately one hour. There are no restrictions for this procedure. This will take place at the [address].   Follow-Up: At [facility], you and your health needs are our priority.  As part of our continuing mission to provide you with exceptional heart care, we have created designated Provider Care Teams.  These Care Teams include your primary Cardiologist (physician) and Advanced Practice Providers (APPs -  Physician Assistants and Nurse Practitioners) who all work together to provide you with the care you need, when you need it.  Your next appointment:   1 year(s)  Provider:   Dr. Margaretann Loveless

## 2023-01-20 DIAGNOSIS — J309 Allergic rhinitis, unspecified: Secondary | ICD-10-CM | POA: Diagnosis not present

## 2023-01-20 DIAGNOSIS — J069 Acute upper respiratory infection, unspecified: Secondary | ICD-10-CM | POA: Diagnosis not present

## 2023-01-20 DIAGNOSIS — Z6830 Body mass index (BMI) 30.0-30.9, adult: Secondary | ICD-10-CM | POA: Diagnosis not present

## 2023-03-10 DIAGNOSIS — M19011 Primary osteoarthritis, right shoulder: Secondary | ICD-10-CM | POA: Diagnosis not present

## 2023-03-30 DIAGNOSIS — E1169 Type 2 diabetes mellitus with other specified complication: Secondary | ICD-10-CM | POA: Diagnosis not present

## 2023-04-05 DIAGNOSIS — J309 Allergic rhinitis, unspecified: Secondary | ICD-10-CM | POA: Diagnosis not present

## 2023-04-05 DIAGNOSIS — K219 Gastro-esophageal reflux disease without esophagitis: Secondary | ICD-10-CM | POA: Diagnosis not present

## 2023-04-05 DIAGNOSIS — Z6834 Body mass index (BMI) 34.0-34.9, adult: Secondary | ICD-10-CM | POA: Diagnosis not present

## 2023-04-05 DIAGNOSIS — E119 Type 2 diabetes mellitus without complications: Secondary | ICD-10-CM | POA: Diagnosis not present

## 2023-04-05 DIAGNOSIS — E669 Obesity, unspecified: Secondary | ICD-10-CM | POA: Diagnosis not present

## 2023-04-09 DIAGNOSIS — M25511 Pain in right shoulder: Secondary | ICD-10-CM | POA: Diagnosis not present

## 2023-04-14 DIAGNOSIS — M19011 Primary osteoarthritis, right shoulder: Secondary | ICD-10-CM | POA: Diagnosis not present

## 2023-04-29 DIAGNOSIS — N5089 Other specified disorders of the male genital organs: Secondary | ICD-10-CM | POA: Diagnosis not present

## 2023-04-29 DIAGNOSIS — N433 Hydrocele, unspecified: Secondary | ICD-10-CM | POA: Diagnosis not present

## 2023-04-29 DIAGNOSIS — N503 Cyst of epididymis: Secondary | ICD-10-CM | POA: Diagnosis not present

## 2023-06-02 LAB — HEMOGLOBIN A1C: Hemoglobin A1C: 6.7

## 2023-06-02 NOTE — Progress Notes (Signed)
Pt  has PCP. No SDOH needs at this time.  

## 2023-06-14 DIAGNOSIS — M25511 Pain in right shoulder: Secondary | ICD-10-CM | POA: Diagnosis not present

## 2023-06-14 DIAGNOSIS — M19011 Primary osteoarthritis, right shoulder: Secondary | ICD-10-CM | POA: Diagnosis not present

## 2023-06-25 DIAGNOSIS — Z13228 Encounter for screening for other metabolic disorders: Secondary | ICD-10-CM | POA: Diagnosis not present

## 2023-06-29 NOTE — Progress Notes (Signed)
[facility] SCREENING EVENT  DATE: 06/02/2023 SCREENING: B/P  118/72             GLUCOSE: A1C: 6.7 PER CHART REVIEW/PCP: PCP confirmed by staff,via phone call. A1C level reported to the PCP office, via in basket. APPOINTMENTS: PT. Has been seen within the past 12 months confirmed, via chart review. No addidtional Health Equity Team support indicated at this time.

## 2023-07-06 DIAGNOSIS — E1169 Type 2 diabetes mellitus with other specified complication: Secondary | ICD-10-CM | POA: Diagnosis not present

## 2023-07-29 ENCOUNTER — Ambulatory Visit (HOSPITAL_COMMUNITY): Payer: PPO | Attending: Internal Medicine

## 2023-07-29 DIAGNOSIS — I340 Nonrheumatic mitral (valve) insufficiency: Secondary | ICD-10-CM

## 2023-07-31 LAB — ECHOCARDIOGRAM COMPLETE
Area-P 1/2: 3.21 cm2
MV M vel: 4.91 m/s
MV Peak grad: 96.4 mmHg
P 1/2 time: 452 ms
Radius: 0.65 cm
S' Lateral: 2.7 cm

## 2023-08-03 ENCOUNTER — Encounter: Payer: Self-pay | Admitting: Nurse Practitioner

## 2023-08-03 ENCOUNTER — Ambulatory Visit: Payer: PPO | Attending: Nurse Practitioner | Admitting: Nurse Practitioner

## 2023-08-03 VITALS — BP 100/60 | HR 71 | Ht 65.0 in | Wt 202.5 lb

## 2023-08-03 DIAGNOSIS — R072 Precordial pain: Secondary | ICD-10-CM

## 2023-08-03 DIAGNOSIS — G4733 Obstructive sleep apnea (adult) (pediatric): Secondary | ICD-10-CM | POA: Diagnosis not present

## 2023-08-03 DIAGNOSIS — R9431 Abnormal electrocardiogram [ECG] [EKG]: Secondary | ICD-10-CM

## 2023-08-03 DIAGNOSIS — R931 Abnormal findings on diagnostic imaging of heart and coronary circulation: Secondary | ICD-10-CM | POA: Diagnosis not present

## 2023-08-03 DIAGNOSIS — I340 Nonrheumatic mitral (valve) insufficiency: Secondary | ICD-10-CM | POA: Diagnosis not present

## 2023-08-03 DIAGNOSIS — I10 Essential (primary) hypertension: Secondary | ICD-10-CM | POA: Diagnosis not present

## 2023-08-03 DIAGNOSIS — E1169 Type 2 diabetes mellitus with other specified complication: Secondary | ICD-10-CM | POA: Diagnosis not present

## 2023-08-03 DIAGNOSIS — E785 Hyperlipidemia, unspecified: Secondary | ICD-10-CM | POA: Diagnosis not present

## 2023-08-03 DIAGNOSIS — I4510 Unspecified right bundle-branch block: Secondary | ICD-10-CM

## 2023-08-03 MED ORDER — METOPROLOL TARTRATE 100 MG PO TABS: ORAL_TABLET | ORAL | 0 refills | Status: DC

## 2023-08-03 NOTE — Patient Instructions (Signed)
Medication Instructions:  Metoprolol Tartrate- take 1 tablet 2 hours prior to appt.   *If you need a refill on your cardiac medications before your next appointment, please call your pharmacy*   Lab Work: CMET today or 1 week prior to procedure.    Testing/Procedures: Cardiac CT   Follow-Up: At [facility], you and your health needs are our priority.  As part of our continuing mission to provide you with exceptional heart care, we have created designated Provider Care Teams.  These Care Teams include your primary Cardiologist (physician) and Advanced Practice Providers (APPs -  Physician Assistants and Nurse Practitioners) who all work together to provide you with the care you need, when you need it.  We recommend signing up for the patient portal called "MyChart".  Sign up information is provided on this After Visit Summary.  MyChart is used to connect with patients for Virtual Visits (Telemedicine).  Patients are able to view lab/test results, encounter notes, upcoming appointments, etc.  Non-urgent messages can be sent to your provider as well.   To learn more about what you can do with MyChart, go to [email or web address].    Your next appointment:   6 week(s)  Provider:   Bernadene Person, NP        Other Instructions   Your cardiac CT will be scheduled at one of the below locations:   [facility] [address] [place], [place] [address] [phone]  OR  [facility] [address] [place], [place] [address] [phone]  OR   [facility] [address] [place], [place] [address] [phone]  If scheduled at [facility], please arrive at the [facility] and Children's Entrance (Entrance C2) of [facility] 30 minutes prior to test start time. You can use the FREE valet parking offered at entrance C (encouraged to control the heart rate for the test)  Proceed  to the [facility] Radiology Department (first floor) to check-in and test prep.  All radiology patients and guests should use entrance C2 at [facility], accessed from [facility], even though the hospital's physical address listed is [address]    If scheduled at [facility] or [facility], please arrive 15 mins early for check-in and test prep.  There is spacious parking and easy access to the radiology department from the [facility] entrance. Please enter here and check-in with the desk attendant.   Please follow these instructions carefully (unless otherwise directed):  An IV will be required for this test and Nitroglycerin will be given.  Hold all erectile dysfunction medications at least 3 days (72 hrs) prior to test. (Ie viagra, cialis, sildenafil, tadalafil, etc)   On the Night Before the Test: Be sure to Drink plenty of water. Do not consume any caffeinated/decaffeinated beverages or chocolate 12 hours prior to your test. Do not take any antihistamines 12 hours prior to your test. If the patient has contrast allergy: Patient will need a prescription for Prednisone and very clear instructions (as follows): Prednisone 50 mg - take 13 hours prior to test Take another Prednisone 50 mg 7 hours prior to test Take another Prednisone 50 mg 1 hour prior to test Take Benadryl 50 mg 1 hour prior to test Patient must complete all four doses of above prophylactic medications. Patient will need a ride after test due to Benadryl.  On the Day  of the Test: Drink plenty of water until 1 hour prior to the test. Do not eat any food 1 hour prior to test. You may take your regular medications prior to the test.  Take metoprolol (Lopressor) two hours prior to test. If you take Furosemide/Hydrochlorothiazide/Spironolactone, please HOLD on the morning of the test. FEMALES- please wear underwire-free bra if  available, avoid dresses & tight clothing  *For Clinical Staff only. Please instruct patient the following:* Heart Rate Medication Recommendations for Cardiac CT  Resting HR < 50 bpm  No medication  Resting HR 50-60 bpm and BP >110/50 mmHG   Consider Metoprolol tartrate 25 mg PO 90-120 min prior to scan  Resting HR 60-65 bpm and BP >110/50 mmHG  Metoprolol tartrate 50 mg PO 90-120 minutes prior to scan   Resting HR > 65 bpm and BP >110/50 mmHG  Metoprolol tartrate 100 mg PO 90-120 minutes prior to scan  Consider Ivabradine 10-15 mg PO or a calcium channel blocker for resting HR >60 bpm and contraindication to metoprolol tartrate  Consider Ivabradine 10-15 mg PO in combination with metoprolol tartrate for HR >80 bpm        After the Test: Drink plenty of water. After receiving IV contrast, you may experience a mild flushed feeling. This is normal. On occasion, you may experience a mild rash up to 24 hours after the test. This is not dangerous. If this occurs, you can take Benadryl 25 mg and increase your fluid intake. If you experience trouble breathing, this can be serious. If it is severe call 911 IMMEDIATELY. If it is mild, please call our office. If you take any of these medications: Glipizide/Metformin, Avandament, Glucavance, please do not take 48 hours after completing test unless otherwise instructed.  We will call to schedule your test 2-4 weeks out understanding that some insurance companies will need an authorization prior to the service being performed.   For more information and frequently asked questions, please visit our website : [email or web address]  For non-scheduling related questions, please contact the cardiac imaging nurse navigator should you have any questions/concerns: Cardiac Imaging Nurse Navigators Direct Office Dial: [phone]   For scheduling needs, including cancellations and rescheduling, please call [place], [phone].

## 2023-08-03 NOTE — Progress Notes (Signed)
Office Visit    Patient Name: Patrick Cooley Date of Encounter: 08/03/2023  Primary Care Provider:  Sigmund Hazel, MD Primary Cardiologist:  Parke Poisson, MD  Chief Complaint    67 year old male with a history of mitral valve regurgitation, RBBB, precordial pain, hypertensive response to exercise, hyperlipidemia, type 2 diabetes, and OSA who presents for follow-up related to chest pain, hypertension and mitral valve regurgitation.  Past Medical History    Past Medical History:  Diagnosis Date   Acute torn meniscus of knee, right, sequela    loose bodies also   Allergic rhinitis    Anxiety    Benign localized prostatic hyperplasia with lower urinary tract symptoms (LUTS)    urologist--- dr Elvera Lennox. [place]   Bladder outlet obstruction    DM type 2 (diabetes mellitus, type 2) (HCC)    followed by pcp   (08-07-2021 per pt checks blood dialy in am,  fasting sugar-- 114--125)   GERD (gastroesophageal reflux disease)    History of COVID-19 05/2021   per pt positive home test , mild to moderate symptoms,  all symptoms resolved with exception occasional dry cough but could be from allergies   Hyperlipemia    IDA (iron deficiency anemia)    on iron po   OA (osteoarthritis)    OSA on CPAP    PER PT USES NIGHTLY   Tightness in chest    occ with exertion riding mountain bike;  pt referred to cardiology for evaluation ,  dr g. Jacques Navy , lov note in epic 07-15-2021 (nuclear stress test 06-02-2021 (epic) showed low risk with no ischemia, nuclear ef 50%, hypertensive response with exercise) and echo in care everywhere 07-05-2020 ef 55-60% mild MR   Wears glasses    Past Surgical History:  Procedure Laterality Date   APPENDECTOMY  1981   open   COLONSCOPY  2019   HEMORROIDECTOMY  age 59   INGUINAL HERNIA REPAIR Bilateral 2010   KNEE ARTHROSCOPY WITH LATERAL MENISECTOMY Right 08/12/2021   Procedure: KNEE ARTHROSCOPY WITH PARTIAL LATERAL, AND MED MENISECTOMY, CHONDROPLASTY, REMOVAL LOOSE  BODIES;  Surgeon: Eugenia Mcalpine, MD;  Location: [facility];  Service: Orthopedics;  Laterality: Right;  plus adductor and knee block    NASAL SINUS SURGERY  yrs ago   TONSILLECTOMY  as child   UMBILICAL HERNIA REPAIR  2016    Allergies  No Known Allergies   Labs/Other Studies Reviewed    The following studies were reviewed today:  Cardiac Studies & Procedures     STRESS TESTS  EXERCISE TOLERANCE TEST (ETT) 08/11/2022  Narrative   No ST deviation was noted.  ETT with good exercise capacity (10:30); no chest pain; normal blood pressure response; no diagnostic ST changes; negative adequate exercise treadmill.   ECHOCARDIOGRAM  ECHOCARDIOGRAM COMPLETE 07/29/2023  Narrative ECHOCARDIOGRAM REPORT    Patient Name:   Patrick Cooley   Date of Exam: 07/29/2023 Medical Rec #:  [id number]     Height:       65.0 in Accession #:    [id number]    Weight:       214.0 lb Date of Birth:  Jan 21, 1956    BSA:          2.036 m Patient Age:    66 years      BP:           139/79 mmHg Patient Gender: M             HR:  71 bpm. Exam Location:  [address]  Procedure: 2D Echo, 3D Echo, Cardiac Doppler and Color Doppler  Indications:    I34.0 Mitral Valve insufficiency  History:        Patient has prior history of Echocardiogram examinations, most recent 07/28/2022. Risk Factors:Diabetes, HLD and Sleep Apnea.  Sonographer:    Clearence Ped RCS Referring Phys: [id number] Lynda Rainwater A ACHARYA  IMPRESSIONS   1. Left ventricular ejection fraction, by estimation, is 55 to 60%. Left ventricular ejection fraction by 3D volume is 60 %. The left ventricle has normal function. The left ventricle has no regional wall motion abnormalities. There is mild asymmetric left ventricular hypertrophy of the basal-septal segment (13 mm). Left ventricular diastolic parameters are consistent with Grade I diastolic dysfunction (impaired relaxation). 2. Right ventricular systolic function  is normal. The right ventricular size is normal. There is normal pulmonary artery systolic pressure. The estimated right ventricular systolic pressure is 26.0 mmHg. 3. Mild posterior leaflet prolapse. The mitral valve is grossly normal. Mild to moderate mitral valve regurgitation. No evidence of mitral stenosis. 4. The aortic valve is tricuspid. Aortic valve regurgitation is trivial. No aortic stenosis is present. 5. The inferior vena cava is normal in size with greater than 50% respiratory variability, suggesting right atrial pressure of 3 mmHg.  FINDINGS Left Ventricle: Left ventricular ejection fraction, by estimation, is 55 to 60%. Left ventricular ejection fraction by 3D volume is 60 %. The left ventricle has normal function. The left ventricle has no regional wall motion abnormalities. The left ventricular internal cavity size was normal in size. There is mild asymmetric left ventricular hypertrophy of the basal-septal segment. Left ventricular diastolic parameters are consistent with Grade I diastolic dysfunction (impaired relaxation).  Right Ventricle: The right ventricular size is normal. No increase in right ventricular wall thickness. Right ventricular systolic function is normal. There is normal pulmonary artery systolic pressure. The tricuspid regurgitant velocity is 2.40 m/s, and with an assumed right atrial pressure of 3 mmHg, the estimated right ventricular systolic pressure is 26.0 mmHg.  Left Atrium: Left atrial size was normal in size.  Right Atrium: Right atrial size was normal in size.  Pericardium: There is no evidence of pericardial effusion.  Mitral Valve: Mild posterior leaflet prolapse. The mitral valve is grossly normal. Mild to moderate mitral valve regurgitation. No evidence of mitral valve stenosis.  Tricuspid Valve: The tricuspid valve is normal in structure. Tricuspid valve regurgitation is mild . No evidence of tricuspid stenosis.  Aortic Valve: The aortic valve  is tricuspid. Aortic valve regurgitation is trivial. Aortic regurgitation PHT measures 452 msec. No aortic stenosis is present.  Pulmonic Valve: The pulmonic valve was normal in structure. Pulmonic valve regurgitation is trivial. No evidence of pulmonic stenosis.  Aorta: The aortic root is normal in size and structure.  Venous: The inferior vena cava is normal in size with greater than 50% respiratory variability, suggesting right atrial pressure of 3 mmHg.  IAS/Shunts: No atrial level shunt detected by color flow Doppler.   LEFT VENTRICLE PLAX 2D LVIDd:         4.20 cm         Diastology LVIDs:         2.70 cm         LV e' medial:    6.20 cm/s LV PW:         0.90 cm         LV E/e' medial:  17.7 LV IVS:  1.30 cm         LV e' lateral:   8.92 cm/s LVOT diam:     1.90 cm         LV E/e' lateral: 12.3 LV SV:         58 LV SV Index:   28 LVOT Area:     2.84 cm        3D Volume EF LV 3D EF:    Left ventricul ar ejection fraction by 3D volume is 60 %.  3D Volume EF: 3D EF:        60 % LV EDV:       155 ml LV ESV:       63 ml LV SV:        92 ml  RIGHT VENTRICLE RV Basal diam:  2.70 cm RV S prime:     13.40 cm/s TAPSE (M-mode): 1.7 cm RVSP:           26.0 mmHg  LEFT ATRIUM             Index        RIGHT ATRIUM           Index LA diam:        4.30 cm 2.11 cm/m   RA Pressure: 3.00 mmHg LA Vol (A2C):   85.5 ml 42.00 ml/m  RA Area:     9.70 cm LA Vol (A4C):   42.5 ml 20.88 ml/m  RA Volume:   16.80 ml  8.25 ml/m LA Biplane Vol: 63.9 ml 31.39 ml/m AORTIC VALVE LVOT Vmax:   105.00 cm/s LVOT Vmean:  74.400 cm/s LVOT VTI:    0.204 m AI PHT:      452 msec  AORTA Ao Root diam: 2.70 cm Ao Asc diam:  3.10 cm  MITRAL VALVE                  TRICUSPID VALVE MV Area (PHT):                TR Peak grad:   23.0 mmHg MV Decel Time:                TR Vmax:        240.00 cm/s MR Peak grad:    96.4 mmHg    Estimated RAP:  3.00 mmHg MR Mean grad:    49.0 mmHg    RVSP:            26.0 mmHg MR Vmax:         491.00 cm/s MR Vmean:        312.0 cm/s   SHUNTS MR PISA:         2.65 cm     Systemic VTI:  0.20 m MR PISA Eff ROA: 17 mm       Systemic Diam: 1.90 cm MR PISA Radius:  0.65 cm MV E velocity: 110.00 cm/s MV A velocity: 123.00 cm/s MV E/A ratio:  0.89  Weston Brass MD Electronically signed by Weston Brass MD Signature Date/Time: 07/31/2023/9:48:42 PM    Final            Recent Labs: No results found for requested labs within last 365 days.  Recent Lipid Panel No results found for: "CHOL", "TRIG", "HDL", "CHOLHDL", "VLDL", "LDLCALC", "LDLDIRECT"  History of Present Illness    67 year old male with the above past medical history including mitral valve regurgitation, RBBB, precordial pain, hypertensive response to exercise, hyperlipidemia, type 2 diabetes, and  OSA.  He has a history of precordial chest pain, with hypertensive response to exercise noted on exercise Myoview in 2022.  He was started on losartan.  Follow-up ETT in 07/2022 showed good exercise capacity, no significant EKG changes.  He has a history of mitral valve regurgitation.  He was last seen in the office on 12/08/2022 was stable from a cardiac standpoint. Most recent echocardiogram in 07/2023 revealed EF 55 to 60%, normal LV function, no RWMA, mild asymmetric LVH of the basal septal segment, G1 DD, normal RV, mild to moderate mitral valve regurgitation.  Repeat echocardiogram was recommended in 1 year.    He presents today for follow-up.  Since his last visit he has been stable overall from a cardiac standpoint though he does note over the past several weeks he has been walking daily with his wife and he has noted exertional chest tightness consistently.  He denies any dyspnea, palpitations.  He is concerned that his symptoms are coming from his heart is interested in possible ischemic evaluation.  Otherwise, he denies any additional concerns today.  Home Medications    Current  Outpatient Medications  Medication Sig Dispense Refill   metoprolol tartrate (LOPRESSOR) 100 MG tablet Take 1 tablet 2 hours prior to cardiac CT. 1 tablet 0   alfuzosin (UROXATRAL) 10 MG 24 hr tablet Take 10 mg by mouth daily with breakfast.     amoxicillin (AMOXIL) 875 MG tablet 1 tablet (Patient not taking: Reported on 08/03/2023)     aspirin EC 81 MG tablet Take 81 mg by mouth at bedtime. Swallow whole.     atorvastatin (LIPITOR) 20 MG tablet Take 20 mg by mouth daily.     cetirizine (ZYRTEC) 10 MG tablet Take 10 mg by mouth daily.     Cholecalciferol (VITAMIN D3 PO) Take by mouth daily.     Cyanocobalamin (VITAMIN B 12) 500 MCG TABS Take by mouth daily.     dapagliflozin propanediol (FARXIGA) 10 MG TABS tablet Take 10 mg by mouth daily.     Dulaglutide (TRULICITY) 3 MG/0.5ML SOPN Inject 3 mg into the skin once a week. Thursday's (Patient not taking: Reported on 12/08/2022)     esomeprazole (NEXIUM) 40 MG capsule Take 40 mg by mouth daily.     ferrous sulfate 325 (65 FE) MG tablet Take 325 mg by mouth every evening.     glipiZIDE (GLUCOTROL) 5 MG tablet Take 5 mg by mouth daily.     losartan (COZAAR) 25 MG tablet Take 1 tablet (25 mg total) by mouth daily. 90 tablet 3   metFORMIN (GLUCOPHAGE) 1000 MG tablet Take 1,000 mg by mouth 2 (two) times daily with a meal.     methocarbamol (ROBAXIN) 500 MG tablet Take 1 tablet (500 mg total) by mouth 4 (four) times daily. (Patient not taking: Reported on 12/08/2022) 40 tablet 0   naproxen sodium (ALEVE) 220 MG tablet Take 220 mg by mouth 2 (two) times daily as needed.     sertraline (ZOLOFT) 50 MG tablet Take 50 mg by mouth daily.     sitaGLIPtin (JANUVIA) 100 MG tablet Take 100 mg by mouth daily. (Patient not taking: Reported on 08/03/2023)     sucralfate (CARAFATE) 1 g tablet Take 1 g by mouth 4 (four) times daily -  with meals and at bedtime.     tadalafil (CIALIS) 5 MG tablet Take 5 mg by mouth every evening.     tirzepatide (MOUNJARO) 7.5 MG/0.5ML  Pen Inject 5 mg into the  skin once a week.     No current facility-administered medications for this visit.     Review of Systems    He denies palpitations, dyspnea, pnd, orthopnea, n, v, dizziness, syncope, edema, weight gain, or early satiety. All other systems reviewed and are otherwise negative except as noted above.   Physical Exam    VS:  BP 100/60   Pulse 71   Ht 5\' 5"  (1.651 m)   Wt 202 lb 8 oz (91.9 kg)   SpO2 94%   BMI 33.70 kg/m   GEN: Well nourished, well developed, in no acute distress. HEENT: normal. Neck: Supple, no JVD, carotid bruits, or masses. Cardiac: RRR, no murmurs, rubs, or gallops. No clubbing, cyanosis, edema.  Radials/DP/PT 2+ and equal bilaterally.  Respiratory:  Respirations regular and unlabored, clear to auscultation bilaterally. GI: Soft, nontender, nondistended, BS + x 4. MS: no deformity or atrophy. Skin: warm and dry, no rash. Neuro:  Strength and sensation are intact. Psych: Normal affect.  Accessory Clinical Findings    ECG personally reviewed by me today - EKG Interpretation Date/Time:  Tuesday August 03 2023 09:36:25 EDT Ventricular Rate:  71 PR Interval:  238 QRS Duration:  144 QT Interval:  422 QTC Calculation: 458 R Axis:   0  Text Interpretation: Sinus rhythm with 1st degree A-V block Right bundle branch block T wave abnormality Confirmed by Bernadene Person ([id number]) on 08/03/2023 9:58:06 AM  - no acute changes.   Lab Results  Component Value Date   WBC 6.8 08/12/2021   HGB 15.0 08/12/2021   HCT 45.3 08/12/2021   MCV 86.5 08/12/2021   PLT 226 08/12/2021   Lab Results  Component Value Date   CREATININE 0.84 08/12/2021   BUN 16 08/12/2021   NA 137 08/12/2021   K 3.9 08/12/2021   CL 102 08/12/2021   CO2 23 08/12/2021   Lab Results  Component Value Date   ALT 23 08/12/2021   AST 20 08/12/2021   ALKPHOS 75 08/12/2021   BILITOT 0.7 08/12/2021   No results found for: "CHOL", "HDL", "LDLCALC", "LDLDIRECT", "TRIG",  "CHOLHDL"  Lab Results  Component Value Date   HGBA1C 6.7 06/02/2023    Assessment & Plan    1. Precordial pain/elevated coronary artery calcium score/Hypertensive response to exercise/RBBB: History of elevated coronary calcium score per Dr. Lupe Carney prior notes (unable to view score). Also with a history of hypertensive response to exercise, prior ETT is reassuring.  Most recent echo as below, overall reassuring.  He notes a several week history of exertional chest tightness with minimal exertion (walking).  He states this is somewhat different than his prior chest discomfort.  Through shared decision making, will pursue coronary CT angiogram.  Will update BMET today.  Reviewed ED precautions.  Continue aspirin, losartan, Lipitor.  2. Mitral valve regurgitation: Most recent echocardiogram in 07/2023 revealed EF 55 to 60%, normal LV function, no RWMA, mild asymmetric LVH of the basal septal segment, G1 DD, normal RV, mild to moderate mitral valve regurgitation.  Repeat echocardiogram was recommended in 1 year.    3. Hyperlipidemia: LDL was 36 in 07/2022.  He is pending repeat lipids per PCP later this month. Continue Lipitor.  4. Type 2 diabetes: A1c was 6.7 in 06/2023.  Monitored and managed per PCP.  5. OSA: Adherent to CPAP, denies any concerns.  6. Disposition: Follow-up in 6 weeks.      Joylene Grapes, NP 08/03/2023, 10:24 AM

## 2023-08-04 LAB — BASIC METABOLIC PANEL
BUN/Creatinine Ratio: 15 (ref 10–24)
BUN: 15 mg/dL (ref 8–27)
CO2: 26 mmol/L (ref 20–29)
Calcium: 9.7 mg/dL (ref 8.6–10.2)
Chloride: 101 mmol/L (ref 96–106)
Creatinine, Ser: 1 mg/dL (ref 0.76–1.27)
Glucose: 160 mg/dL — ABNORMAL HIGH (ref 70–99)
Potassium: 4.5 mmol/L (ref 3.5–5.2)
Sodium: 139 mmol/L (ref 134–144)
eGFR: 84 mL/min/{1.73_m2} (ref >59)

## 2023-08-06 ENCOUNTER — Encounter (HOSPITAL_COMMUNITY): Payer: Self-pay

## 2023-08-10 ENCOUNTER — Ambulatory Visit (HOSPITAL_BASED_OUTPATIENT_CLINIC_OR_DEPARTMENT_OTHER)
Admission: RE | Admit: 2023-08-10 | Discharge: 2023-08-10 | Disposition: A | Discharge status: Home / Self Care | Payer: PPO | Source: Ambulatory Visit | Attending: Cardiology | Admitting: Cardiology

## 2023-08-10 ENCOUNTER — Ambulatory Visit (HOSPITAL_COMMUNITY)
Admission: RE | Admit: 2023-08-10 | Discharge: 2023-08-10 | Disposition: A | Discharge status: Home / Self Care | Payer: PPO | Source: Ambulatory Visit | Attending: Nurse Practitioner | Admitting: Nurse Practitioner

## 2023-08-10 ENCOUNTER — Other Ambulatory Visit (HOSPITAL_COMMUNITY): Payer: Self-pay | Admitting: *Deleted

## 2023-08-10 DIAGNOSIS — R931 Abnormal findings on diagnostic imaging of heart and coronary circulation: Secondary | ICD-10-CM | POA: Insufficient documentation

## 2023-08-10 DIAGNOSIS — R072 Precordial pain: Secondary | ICD-10-CM | POA: Insufficient documentation

## 2023-08-10 DIAGNOSIS — Z79899 Other long term (current) drug therapy: Secondary | ICD-10-CM | POA: Diagnosis not present

## 2023-08-10 DIAGNOSIS — I2510 Atherosclerotic heart disease of native coronary artery without angina pectoris: Secondary | ICD-10-CM

## 2023-08-10 DIAGNOSIS — Z125 Encounter for screening for malignant neoplasm of prostate: Secondary | ICD-10-CM | POA: Diagnosis not present

## 2023-08-10 DIAGNOSIS — E1169 Type 2 diabetes mellitus with other specified complication: Secondary | ICD-10-CM | POA: Diagnosis not present

## 2023-08-10 MED ORDER — NITROGLYCERIN 0.4 MG SL SUBL
0.8000 mg | SUBLINGUAL_TABLET | Freq: Once | SUBLINGUAL | Status: AC
  Administered 2023-08-10: 0.8 mg via SUBLINGUAL

## 2023-08-10 MED ORDER — NITROGLYCERIN 0.4 MG SL SUBL
SUBLINGUAL_TABLET | SUBLINGUAL | Status: AC
  Filled 2023-08-10: qty 2

## 2023-08-10 MED ORDER — IOHEXOL 350 MG/ML SOLN
95.0000 mL | Freq: Once | INTRAVENOUS | Status: AC | PRN
Start: 2023-08-10 — End: 2023-08-10
  Administered 2023-08-10: 95 mL via INTRAVENOUS

## 2023-08-25 ENCOUNTER — Telehealth: Payer: Self-pay

## 2023-08-25 DIAGNOSIS — I340 Nonrheumatic mitral (valve) insufficiency: Secondary | ICD-10-CM | POA: Diagnosis not present

## 2023-08-25 DIAGNOSIS — E7800 Pure hypercholesterolemia, unspecified: Secondary | ICD-10-CM | POA: Diagnosis not present

## 2023-08-25 DIAGNOSIS — Z0000 Encounter for general adult medical examination without abnormal findings: Secondary | ICD-10-CM | POA: Diagnosis not present

## 2023-08-25 DIAGNOSIS — I10 Essential (primary) hypertension: Secondary | ICD-10-CM | POA: Diagnosis not present

## 2023-08-25 DIAGNOSIS — Z6833 Body mass index (BMI) 33.0-33.9, adult: Secondary | ICD-10-CM | POA: Diagnosis not present

## 2023-08-25 DIAGNOSIS — E1136 Type 2 diabetes mellitus with diabetic cataract: Secondary | ICD-10-CM | POA: Diagnosis not present

## 2023-08-25 DIAGNOSIS — R0789 Other chest pain: Secondary | ICD-10-CM | POA: Diagnosis not present

## 2023-08-25 DIAGNOSIS — I5189 Other ill-defined heart diseases: Secondary | ICD-10-CM | POA: Diagnosis not present

## 2023-08-25 DIAGNOSIS — F411 Generalized anxiety disorder: Secondary | ICD-10-CM | POA: Diagnosis not present

## 2023-08-25 DIAGNOSIS — D692 Other nonthrombocytopenic purpura: Secondary | ICD-10-CM | POA: Diagnosis not present

## 2023-08-25 DIAGNOSIS — N400 Enlarged prostate without lower urinary tract symptoms: Secondary | ICD-10-CM | POA: Diagnosis not present

## 2023-08-25 DIAGNOSIS — I2510 Atherosclerotic heart disease of native coronary artery without angina pectoris: Secondary | ICD-10-CM | POA: Diagnosis not present

## 2023-08-25 NOTE — Telephone Encounter (Signed)
Coronary CT results reviewed by pt via mychart.

## 2023-08-30 ENCOUNTER — Encounter: Payer: Self-pay | Admitting: *Deleted

## 2023-08-30 DIAGNOSIS — M19011 Primary osteoarthritis, right shoulder: Secondary | ICD-10-CM | POA: Diagnosis not present

## 2023-08-30 NOTE — Progress Notes (Signed)
Pt attended 06/02/23 screening event where his b/p was 115/58 and his A1C was 6.7. At the event the pt confirmed his PCP was Dr. Sigmund Hazel at the [facility] clinic and he did not identify any SDOH. Chart review confirms pt was seen by PCP on 03/30/23 and 04/05/23 was most recently seen by Dr. Hyacinth Meeker on 08/25/23 for his annual physical exam. Per PCP's 08/25/23 visit notes, pt's A1C was 6.6 on 08/10/23, and his Mounjaro dose was increased. Also per chart review, pt has had ongoing specialty coverage by both Orthopedics and Cardiology. Pt does have a future appt with his cardiologist on 09/14/23 and a future appt with his PCP on 11/25/23. No additional health equity team support indicated at this time.

## 2023-09-03 ENCOUNTER — Ambulatory Visit: Payer: PPO | Attending: Adult Health | Admitting: Adult Health

## 2023-09-03 ENCOUNTER — Other Ambulatory Visit: Payer: Self-pay | Admitting: Adult Health

## 2023-09-03 ENCOUNTER — Encounter: Payer: Self-pay | Admitting: Adult Health

## 2023-09-03 VITALS — BP 120/76 | HR 78 | Resp 99 | Ht 64.75 in | Wt 202.2 lb

## 2023-09-03 DIAGNOSIS — E7800 Pure hypercholesterolemia, unspecified: Secondary | ICD-10-CM

## 2023-09-03 DIAGNOSIS — I10 Essential (primary) hypertension: Secondary | ICD-10-CM

## 2023-09-03 DIAGNOSIS — I2089 Other forms of angina pectoris: Secondary | ICD-10-CM

## 2023-09-03 DIAGNOSIS — E1169 Type 2 diabetes mellitus with other specified complication: Secondary | ICD-10-CM | POA: Diagnosis not present

## 2023-09-03 DIAGNOSIS — Z136 Encounter for screening for cardiovascular disorders: Secondary | ICD-10-CM | POA: Diagnosis not present

## 2023-09-03 MED ORDER — NITROGLYCERIN 0.4 MG SL SUBL: 0.4000 mg | SUBLINGUAL_TABLET | SUBLINGUAL | 3 refills | Status: AC | PRN

## 2023-09-03 NOTE — Progress Notes (Signed)
Cardiology Office Note:  .   Date:  09/03/2023  ID:  Patrick Cooley, DOB 1956-08-11, MRN [id number] PCP: Patrick Hazel, MD  [facility] Providers Cardiologist:  Patrick Brass, MD   }   History of Present Illness: .   Patrick Cooley is a 67 y.o. male with history of mitral valve regurgitation, precordial pain, hypertensive response to exercise, right bundle branch block, hyperlipidemia, OSA, and type 2 diabetes.  Last seen in the office on 08/03/2023 by Patrick Person DNP at which time a cardiac CTA was ordered.  He is here to discuss results.   Since being seen last patient continues to have exertional chest tightness and shortness of breath.  He states he likes to walk with his wife who has had bypass surgery and she is doing better than he is.  He feels very tired, with midsternal chest tightness with exertion.  He also states that he feels it with minimal exertion walking in his home.  Resting resolves quickly.  He denies dizziness, palpitations, or near syncope.  ROS: As above otherwise negative.  Studies Reviewed: Marland Kitchen   EKG Interpretation Date/Time:  Friday September 03 2023 15:47:14 EDT Ventricular Rate:  78 PR Interval:  216 QRS Duration:  140 QT Interval:  414 QTC Calculation: 471 R Axis:   49  Text Interpretation: Sinus rhythm with 1st degree A-V block Right bundle branch block When compared with ECG of 03-Aug-2023 09:36, T wave inversion no longer evident in Anterolateral leads Confirmed by Patrick Cooley [phone]) on 09/03/2023 4:52:07 PM    Cardiac CTA 08/10/2023 1. Left Main: No significant stenosis.   2. LAD: No significant stenosis.  FFR 0.62 distal LAD. 3. LCX: No significant stenosis.  FFR 0.85 distal LCx. 4. RCA: No significant stenosis.   IMPRESSION: 1. CT FFR analysis showed a hemodynamically significant stenosis in the mid LAD distal to origin of a large diagonal. LAD is small in caliber at this point.  ADDENDUM: After viewing CT FFR data, the LAD needs to be  re-interpreted. The noncalcified plaque mentioned in the mid LAD on the original report does not appear to be hemodynamically significant. However, just past this stenosis the LAD trifurcates into a large diagonal branch, a small diagonal branch, and the continuation of the actual LAD. The LAD continuation is smaller in caliber than the large diagonal, but has a severe, hemodynamically significant stenosis (FFR 0.62 in the distal LAD).   Patrick Cooley   EKG Interpretation Date/Time:  Friday September 03 2023 15:47:14 EDT Ventricular Rate:  78 PR Interval:  216 QRS Duration:  140 QT Interval:  414 QTC Calculation: 471 R Axis:   49  Text Interpretation: Sinus rhythm with 1st degree A-V block Right bundle branch block When compared with ECG of 03-Aug-2023 09:36, T wave inversion no longer evident in Anterolateral leads Confirmed by Patrick Cooley [phone]) on 09/03/2023 4:52:07 PM   Physical Exam:   VS:  BP 120/76 (BP Location: Left Arm, Patient Position: Sitting, Cuff Size: Normal)   Pulse 78   Resp (!) 99   Ht 5' 4.75" (1.645 m)   Wt 202 lb 3.2 oz (91.7 kg)   BMI 33.91 kg/m    Wt Readings from Last 3 Encounters:  09/03/23 202 lb 3.2 oz (91.7 kg)  08/03/23 202 lb 8 oz (91.9 kg)  12/08/22 214 lb (97.1 kg)    GEN: Well nourished, well developed in no acute distress central obesity is noted. NECK: No JVD; No carotid bruits CARDIAC: RRR, no  murmurs, rubs, gallops RESPIRATORY:  Clear to auscultation without rales, wheezing or rhonchi  ABDOMEN: Soft, non-tender, non-distended EXTREMITIES:  No edema; No deformity   ASSESSMENT AND PLAN: .   Abnormal coronary CTA: None hemodynamically significant stenosis in the LAD with noncalcified plaque however past this stenosis the LAD trifurcates into 2 large diagonal branch and small diagonal branch which is a continuation of the actual LAD.  The LAD continuation is smaller in caliber than the large diagonal but has severe hemodynamically  significant stenosis.      I have discussed this with the patient.  We have talked about medical management with addition of isosorbide versus cardiac catheterization.  The patient is adamant about moving forward with cardiac catheterization.  He would like to have this done as soon as possible.  He is being scheduled with Dr.Patwardhan on Wednesday, October 23, at 8:30 AM.  The patient has been advised to hold [facility], last dose was 1 week ago Sunday, and therefore he will not take it again this Sunday.  He is advised to hold Farxiga for 48 hours prior to the procedure as well as metformin day prior day of procedure.  He is given these instructions and verbalizes understanding.  2.  Hypercholesterolemia: The patient remains on atorvastatin 20 mg daily.  I have explained to him this may need to be adjusted based upon results of cardiac catheterization.  He verbalizes understanding.  3.  Hypertension: Remains on losartan 25 mg dail 100 mg daily..  Blood pressure is well-controlled today.    Informed Consent   Shared Decision Making/Informed Consent The risks [stroke (1 in 1000), death (1 in 1000), kidney failure [usually temporary] (1 in 500), bleeding (1 in 200), allergic reaction [possibly serious] (1 in 200)], benefits (diagnostic support and management of coronary artery disease) and alternatives of a cardiac catheterization were discussed in detail with Patrick Cooley and he is willing to proceed.     Dispo:   Patrick Cooley, ANP, AACC

## 2023-09-03 NOTE — Patient Instructions (Addendum)
Medication Instructions:  Nitroglycerin 0.4 mg ( Take As Directed). *If you need a refill on your cardiac medications before your next appointment, please call your pharmacy*   Lab Work: BMET, CBC If you have labs (blood work) drawn today and your tests are completely normal, you will receive your results only by: MyChart Message (if you have MyChart) OR A paper copy in the mail If you have any lab test that is abnormal or we need to change your treatment, we will call you to review the results.   Testing/Procedures:       Cardiac/Peripheral Catheterization   You are scheduled for a Cardiac Catheterization on Wednesday, October 23 with Dr.  Truett Mainland, MD .  1. Please arrive at the [facility] (Main Entrance A) at [facility]: [address] [place], [place] [address] at 6:30 AM (This time is 2 hour(s) before your procedure to ensure your preparation). Free valet parking service is available. You will check in at ADMITTING. The support person will be asked to wait in the waiting room.  It is OK to have someone drop you off and come back when you are ready to be discharged.        Special note: Every effort is made to have your procedure done on time. Please understand that emergencies sometimes delay scheduled procedures.  2. Diet: Do not eat solid foods after midnight.  You may have clear liquids until 5 AM the day of the procedure.  3. Labs: You will need to have blood drawn on Friday, October 18 at [facility] [address], [place]  Open: 8am - 5pm (Lunch 12:30 - 1:30)   Phone: [phone]. You do not need to be fasting.  4. Medication instructions in preparation for your procedure:   Contrast Allergy: No   Current Outpatient Medications (Endocrine & Metabolic):    dapagliflozin propanediol (FARXIGA) 10 MG TABS tablet, Take 10 mg by mouth daily.   metFORMIN (GLUCOPHAGE) 1000 MG tablet, Take 1,000 mg by mouth 2 (two) times daily with a meal.    tirzepatide (MOUNJARO) 7.5 MG/0.5ML Pen, Inject 5 mg into the skin once a week.   Dulaglutide (TRULICITY) 3 MG/0.5ML SOPN, Inject 3 mg into the skin once a week. Thursday's (Patient not taking: Reported on 12/08/2022)   glipiZIDE (GLUCOTROL) 5 MG tablet, Take 5 mg by mouth daily. (Patient not taking: Reported on 09/03/2023)   sitaGLIPtin (JANUVIA) 100 MG tablet, Take 100 mg by mouth daily. (Patient not taking: Reported on 08/03/2023)  Current Outpatient Medications (Cardiovascular):    atorvastatin (LIPITOR) 20 MG tablet, Take 20 mg by mouth daily.   losartan (COZAAR) 25 MG tablet, Take 1 tablet (25 mg total) by mouth daily.   tadalafil (CIALIS) 5 MG tablet, Take 5 mg by mouth every evening.   metoprolol tartrate (LOPRESSOR) 100 MG tablet, Take 1 tablet 2 hours prior to cardiac CT. (Patient not taking: Reported on 09/03/2023)  Current Outpatient Medications (Respiratory):    cetirizine (ZYRTEC) 10 MG tablet, Take 10 mg by mouth daily.  Current Outpatient Medications (Analgesics):    aspirin EC 81 MG tablet, Take 81 mg by mouth at bedtime. Swallow whole.   naproxen sodium (ALEVE) 220 MG tablet, Take 220 mg by mouth 2 (two) times daily as needed.  Current Outpatient Medications (Hematological):    Cyanocobalamin (VITAMIN B 12) 500 MCG TABS, Take by mouth daily.   ferrous sulfate 325 (65 FE) MG tablet, Take 325 mg by mouth every evening.  Current Outpatient Medications (Other):    alfuzosin (UROXATRAL) 10 MG 24 hr tablet, Take 10 mg by mouth daily with breakfast.   Cholecalciferol (VITAMIN D3 PO), Take by mouth daily.   esomeprazole (NEXIUM) 40 MG capsule, Take 40 mg by mouth daily.   sertraline (ZOLOFT) 50 MG tablet, Take 50 mg by mouth daily.   sucralfate (CARAFATE) 1 g tablet, Take 1 g by mouth 4 (four) times daily -  with meals and at bedtime.   amoxicillin (AMOXIL) 875 MG tablet, 1 tablet (Patient not taking: Reported on 08/03/2023)   methocarbamol (ROBAXIN) 500 MG tablet, Take 1 tablet  (500 mg total) by mouth 4 (four) times daily. (Patient not taking: Reported on 12/08/2022) *For reference purposes while preparing patient instructions.   Delete this med list prior to printing instructions for patient.*    Marcelline Deist Stop Taking on Sunday October 20 th.  Mounjaro   Do Not Take On Sunday October 20 th.  Metformin Hold Day Before and Day of Procedure.  On the morning of your procedure, take Aspirin 81 mg and any morning medicines NOT listed above.  You may use sips of water.  5. Plan to go home the same day, you will only stay overnight if medically necessary. 6. You MUST have a responsible adult to drive you home. 7. An adult MUST be with you the first 24 hours after you arrive home. 8. Bring a current list of your medications, and the last time and date medication taken. 9. Bring ID and current insurance cards. 10.Please wear clothes that are easy to get on and off and wear slip-on shoes.  Thank you for allowing [place] to care for you!   -- [facility]    Follow-Up: At [facility], you and your health needs are our priority.  As part of our continuing mission to provide you with exceptional heart care, we have created designated Provider Care Teams.  These Care Teams include your primary Cardiologist (physician) and Advanced Practice Providers (APPs -  Physician Assistants and Nurse Practitioners) who all work together to provide you with the care you need, when you need it.  We recommend signing up for the patient portal called "MyChart".  Sign up information is provided on this After Visit Summary.  MyChart is used to connect with patients for Virtual Visits (Telemedicine).  Patients are able to view lab/test results, encounter notes, upcoming appointments, etc.  Non-urgent messages can be sent to your provider as well.   To learn more about what you can do with MyChart, go to [email or web address].    Your next appointment:   2  week(s) Post Cath  Provider:   First Available APP

## 2023-09-04 LAB — BASIC METABOLIC PANEL
BUN/Creatinine Ratio: 14 (ref 10–24)
BUN: 15 mg/dL (ref 8–27)
CO2: 22 mmol/L (ref 20–29)
Calcium: 9.3 mg/dL (ref 8.6–10.2)
Chloride: 103 mmol/L (ref 96–106)
Creatinine, Ser: 1.08 mg/dL (ref 0.76–1.27)
Glucose: 146 mg/dL — ABNORMAL HIGH (ref 70–99)
Potassium: 4.6 mmol/L (ref 3.5–5.2)
Sodium: 141 mmol/L (ref 134–144)
eGFR: 76 mL/min/{1.73_m2} (ref >59)

## 2023-09-04 LAB — CBC
Hematocrit: 45.1 % (ref 37.5–51.0)
Hemoglobin: 14.8 g/dL (ref 13.0–17.7)
MCH: 28.6 pg (ref 26.6–33.0)
MCHC: 32.8 g/dL (ref 31.5–35.7)
MCV: 87 fL (ref 79–97)
Platelets: 238 10*3/uL (ref 150–450)
RBC: 5.18 x10E6/uL (ref 4.14–5.80)
RDW: 12.5 % (ref 11.6–15.4)
WBC: 6.8 10*3/uL (ref 3.4–10.8)

## 2023-09-07 ENCOUNTER — Telehealth: Payer: Self-pay | Admitting: *Deleted

## 2023-09-07 DIAGNOSIS — G4733 Obstructive sleep apnea (adult) (pediatric): Secondary | ICD-10-CM | POA: Diagnosis not present

## 2023-09-07 NOTE — Telephone Encounter (Signed)
Cardiac Catheterization scheduled at [facility] for: Wednesday September 08, 2023 8:30 AM Arrival time [facility] Main Entrance A at: 6:30 AM  Nothing to eat after midnight prior to procedure, clear liquids until 5 AM day of procedure.  Medication instructions: -Hold:  Metformin-day of procedure and 48 hours post procedure  Farxiga-AM of procedure  Mounjaro-pt reports weekly on Sundays-did not take 09/05/23 -Other usual morning medications can be taken with sips of water including aspirin 81 mg.  Plan to go home the same day, you will only stay overnight if medically necessary.  You must have responsible adult to drive you home.  Someone must be with you the first 24 hours after you arrive home.  Reviewed procedure instructions with patient.

## 2023-09-08 ENCOUNTER — Observation Stay (HOSPITAL_COMMUNITY)
Admission: RE | Admit: 2023-09-08 | Discharge: 2023-09-09 | Disposition: A | Discharge status: Home / Self Care | Payer: PPO | Attending: Cardiology | Admitting: Cardiology

## 2023-09-08 ENCOUNTER — Other Ambulatory Visit: Payer: Self-pay

## 2023-09-08 ENCOUNTER — Other Ambulatory Visit (HOSPITAL_COMMUNITY): Payer: Self-pay

## 2023-09-08 ENCOUNTER — Encounter (HOSPITAL_COMMUNITY)
Admission: RE | Disposition: A | Discharge status: Home / Self Care | Payer: Self-pay | Source: Home / Self Care | Attending: Cardiology

## 2023-09-08 ENCOUNTER — Encounter (HOSPITAL_COMMUNITY): Payer: Self-pay | Admitting: Cardiology

## 2023-09-08 DIAGNOSIS — I2510 Atherosclerotic heart disease of native coronary artery without angina pectoris: Secondary | ICD-10-CM | POA: Diagnosis not present

## 2023-09-08 DIAGNOSIS — I2089 Other forms of angina pectoris: Secondary | ICD-10-CM

## 2023-09-08 DIAGNOSIS — E785 Hyperlipidemia, unspecified: Secondary | ICD-10-CM | POA: Insufficient documentation

## 2023-09-08 DIAGNOSIS — E119 Type 2 diabetes mellitus without complications: Secondary | ICD-10-CM | POA: Insufficient documentation

## 2023-09-08 DIAGNOSIS — I25110 Atherosclerotic heart disease of native coronary artery with unstable angina pectoris: Secondary | ICD-10-CM | POA: Diagnosis not present

## 2023-09-08 DIAGNOSIS — Z955 Presence of coronary angioplasty implant and graft: Principal | ICD-10-CM

## 2023-09-08 DIAGNOSIS — I10 Essential (primary) hypertension: Secondary | ICD-10-CM | POA: Insufficient documentation

## 2023-09-08 DIAGNOSIS — E118 Type 2 diabetes mellitus with unspecified complications: Secondary | ICD-10-CM | POA: Insufficient documentation

## 2023-09-08 HISTORY — PX: CORONARY STENT INTERVENTION: CATH118234

## 2023-09-08 HISTORY — PX: LEFT HEART CATH AND CORONARY ANGIOGRAPHY: CATH118249

## 2023-09-08 HISTORY — PX: CORONARY ULTRASOUND/IVUS: CATH118244

## 2023-09-08 LAB — CBC
HCT: 39.5 % (ref 39.0–52.0)
Hemoglobin: 13.4 g/dL (ref 13.0–17.0)
MCH: 28.3 pg (ref 26.0–34.0)
MCHC: 33.9 g/dL (ref 30.0–36.0)
MCV: 83.3 fL (ref 80.0–100.0)
Platelets: 208 10*3/uL (ref 150–400)
RBC: 4.74 MIL/uL (ref 4.22–5.81)
RDW: 12.4 % (ref 11.5–15.5)
WBC: 6.2 10*3/uL (ref 4.0–10.5)
nRBC: 0 % (ref 0.0–0.2)

## 2023-09-08 LAB — POCT ACTIVATED CLOTTING TIME
Activated Clotting Time: 348 s
Activated Clotting Time: 256 s
Activated Clotting Time: 464 s
Activated Clotting Time: 195 s

## 2023-09-08 LAB — CREATININE, SERUM
Creatinine, Ser: 0.93 mg/dL (ref 0.61–1.24)
GFR, Estimated: >60 mL/min (ref >60)

## 2023-09-08 LAB — MRSA NEXT GEN BY PCR, NASAL: MRSA by PCR Next Gen: NOT DETECTED

## 2023-09-08 LAB — GLUCOSE, CAPILLARY
Glucose-Capillary: 141 mg/dL — ABNORMAL HIGH (ref 70–99)
Glucose-Capillary: 188 mg/dL — ABNORMAL HIGH (ref 70–99)

## 2023-09-08 SURGERY — LEFT HEART CATH AND CORONARY ANGIOGRAPHY
Anesthesia: LOCAL

## 2023-09-08 MED ORDER — LIDOCAINE HCL (PF) 1 % IJ SOLN
INTRAMUSCULAR | Status: DC | PRN
  Administered 2023-09-08: 2 mL via INTRADERMAL

## 2023-09-08 MED ORDER — IOHEXOL 350 MG/ML SOLN
INTRAVENOUS | Status: DC | PRN
  Administered 2023-09-08: 315 mL

## 2023-09-08 MED ORDER — FAMOTIDINE IN NACL 20-0.9 MG/50ML-% IV SOLN
INTRAVENOUS | Status: AC
  Filled 2023-09-08: qty 50

## 2023-09-08 MED ORDER — SODIUM CHLORIDE 0.9 % IV BOLUS: INTRAVENOUS | Status: DC | PRN

## 2023-09-08 MED ORDER — ONDANSETRON HCL 4 MG/2ML IJ SOLN: 4.0000 mg | Freq: Four times a day (QID) | INTRAMUSCULAR | Status: DC | PRN

## 2023-09-08 MED ORDER — CLOPIDOGREL BISULFATE 300 MG PO TABS
ORAL_TABLET | ORAL | Status: DC | PRN
  Administered 2023-09-08: 600 mg via ORAL

## 2023-09-08 MED ORDER — VERAPAMIL HCL 2.5 MG/ML IV SOLN
INTRAVENOUS | Status: AC
  Filled 2023-09-08: qty 2

## 2023-09-08 MED ORDER — HEPARIN SODIUM (PORCINE) 1000 UNIT/ML IJ SOLN
INTRAMUSCULAR | Status: AC
Start: 2023-09-08 — End: ?
  Filled 2023-09-08: qty 10

## 2023-09-08 MED ORDER — VERAPAMIL HCL 2.5 MG/ML IV SOLN
INTRAVENOUS | Status: DC | PRN
  Administered 2023-09-08: 10 mL via INTRA_ARTERIAL

## 2023-09-08 MED ORDER — ASPIRIN 81 MG PO CHEW: 81.0000 mg | CHEWABLE_TABLET | ORAL | Status: DC

## 2023-09-08 MED ORDER — HYDRALAZINE HCL 20 MG/ML IJ SOLN: 10.0000 mg | INTRAMUSCULAR | Status: AC | PRN

## 2023-09-08 MED ORDER — SODIUM CHLORIDE 0.9 % WEIGHT BASED INFUSION
3.0000 mL/kg/h | INTRAVENOUS | Status: AC
  Administered 2023-09-08: 3 mL/kg/h via INTRAVENOUS

## 2023-09-08 MED ORDER — NITROGLYCERIN 1 MG/10 ML FOR IR/CATH LAB
INTRA_ARTERIAL | Status: DC | PRN
  Administered 2023-09-08: 100 ug
  Administered 2023-09-08: 200 ug
  Administered 2023-09-08 (×4): 100 ug

## 2023-09-08 MED ORDER — SODIUM CHLORIDE 0.9% FLUSH
3.0000 mL | Freq: Two times a day (BID) | INTRAVENOUS | Status: DC
  Administered 2023-09-08 – 2023-09-09 (×2): 3 mL via INTRAVENOUS

## 2023-09-08 MED ORDER — SODIUM CHLORIDE 0.9% FLUSH: 3.0000 mL | INTRAVENOUS | Status: DC | PRN

## 2023-09-08 MED ORDER — SODIUM CHLORIDE 0.9 % IV SOLN
INTRAVENOUS | Status: AC
Start: 2023-09-08 — End: 2023-09-08

## 2023-09-08 MED ORDER — HEPARIN SODIUM (PORCINE) 1000 UNIT/ML IJ SOLN
INTRAMUSCULAR | Status: AC
  Filled 2023-09-08: qty 10

## 2023-09-08 MED ORDER — MIDAZOLAM HCL 2 MG/2ML IJ SOLN
INTRAMUSCULAR | Status: AC
  Filled 2023-09-08: qty 2

## 2023-09-08 MED ORDER — LABETALOL HCL 5 MG/ML IV SOLN: 10.0000 mg | INTRAVENOUS | Status: AC | PRN

## 2023-09-08 MED ORDER — PANTOPRAZOLE SODIUM 40 MG PO TBEC
40.0000 mg | DELAYED_RELEASE_TABLET | Freq: Every day | ORAL | 1 refills | Status: DC
  Filled 2023-09-08: qty 30, 30d supply, fill #0

## 2023-09-08 MED ORDER — FENTANYL CITRATE (PF) 100 MCG/2ML IJ SOLN
INTRAMUSCULAR | Status: AC
  Filled 2023-09-08: qty 2

## 2023-09-08 MED ORDER — NITROGLYCERIN 1 MG/10 ML FOR IR/CATH LAB
INTRA_ARTERIAL | Status: AC
  Filled 2023-09-08: qty 10

## 2023-09-08 MED ORDER — FENTANYL CITRATE (PF) 100 MCG/2ML IJ SOLN
INTRAMUSCULAR | Status: DC | PRN
  Administered 2023-09-08 (×4): 25 ug via INTRAVENOUS

## 2023-09-08 MED ORDER — ACETAMINOPHEN 325 MG PO TABS
650.0000 mg | ORAL_TABLET | ORAL | Status: DC | PRN
Start: 2023-09-08 — End: 2023-09-09
  Administered 2023-09-08: 650 mg via ORAL
  Filled 2023-09-08: qty 2

## 2023-09-08 MED ORDER — MIDAZOLAM HCL 2 MG/2ML IJ SOLN
INTRAMUSCULAR | Status: DC | PRN
  Administered 2023-09-08 (×4): 1 mg via INTRAVENOUS

## 2023-09-08 MED ORDER — CLOPIDOGREL BISULFATE 300 MG PO TABS
ORAL_TABLET | ORAL | Status: AC
  Filled 2023-09-08: qty 2

## 2023-09-08 MED ORDER — HEPARIN (PORCINE) IN NACL 1000-0.9 UT/500ML-% IV SOLN
INTRAVENOUS | Status: DC | PRN
  Administered 2023-09-08 (×3): 500 mL

## 2023-09-08 MED ORDER — HEPARIN SODIUM (PORCINE) 1000 UNIT/ML IJ SOLN
INTRAMUSCULAR | Status: DC | PRN
  Administered 2023-09-08: 3000 [IU] via INTRAVENOUS
  Administered 2023-09-08 (×2): 5000 [IU] via INTRAVENOUS
  Administered 2023-09-08: 2000 [IU] via INTRAVENOUS

## 2023-09-08 MED ORDER — HEPARIN SODIUM (PORCINE) 5000 UNIT/ML IJ SOLN
5000.0000 [IU] | Freq: Three times a day (TID) | INTRAMUSCULAR | Status: DC
  Administered 2023-09-08 – 2023-09-09 (×2): 5000 [IU] via SUBCUTANEOUS
  Filled 2023-09-08 (×2): qty 1

## 2023-09-08 MED ORDER — FAMOTIDINE IN NACL 20-0.9 MG/50ML-% IV SOLN
INTRAVENOUS | Status: AC | PRN
  Administered 2023-09-08: 20 mg via INTRAVENOUS

## 2023-09-08 MED ORDER — ATORVASTATIN CALCIUM 80 MG PO TABS
80.0000 mg | ORAL_TABLET | Freq: Every morning | ORAL | 1 refills | Status: AC
  Filled 2023-09-08: qty 90, 90d supply, fill #0

## 2023-09-08 MED ORDER — ORAL CARE MOUTH RINSE: 15.0000 mL | OROMUCOSAL | Status: DC | PRN

## 2023-09-08 MED ORDER — CLOPIDOGREL BISULFATE 75 MG PO TABS
75.0000 mg | ORAL_TABLET | Freq: Every day | ORAL | 1 refills | Status: DC
  Filled 2023-09-08: qty 90, 90d supply, fill #0

## 2023-09-08 MED ORDER — CLOPIDOGREL BISULFATE 75 MG PO TABS
75.0000 mg | ORAL_TABLET | Freq: Every day | ORAL | Status: DC
  Administered 2023-09-09: 75 mg via ORAL
  Filled 2023-09-08: qty 1

## 2023-09-08 MED ORDER — LIDOCAINE HCL (PF) 1 % IJ SOLN
INTRAMUSCULAR | Status: AC
  Filled 2023-09-08: qty 30

## 2023-09-08 MED ORDER — SODIUM CHLORIDE 0.9 % WEIGHT BASED INFUSION
1.0000 mL/kg/h | INTRAVENOUS | Status: DC
  Administered 2023-09-08 (×2): 250 mL via INTRAVENOUS

## 2023-09-08 MED ORDER — SODIUM CHLORIDE 0.9 % IV SOLN
250.0000 mL | INTRAVENOUS | Status: DC | PRN
Start: 2023-09-08 — End: 2023-09-09

## 2023-09-08 MED ORDER — SODIUM CHLORIDE 0.9 % IV SOLN: INTRAVENOUS | Status: AC

## 2023-09-08 SURGICAL SUPPLY — 32 items
BALL SAPPHIRE NC24 3.5X12 (BALLOONS) ×1
BALLN EMERGE MR 2.0X15 (BALLOONS) ×1
BALLN ~~LOC~~ EUPHORA RX 2.5X15 (BALLOONS) ×2
BALLN ~~LOC~~ EUPHORA RX 3.0X12 (BALLOONS) ×1
BALLN ~~LOC~~ EUPHORA RX 3.5X12 (BALLOONS) ×1
BALLOON EMERGE MR 2.0X15 (BALLOONS) IMPLANT
BALLOON SAPPHIRE NC24 3.5X12 (BALLOONS) IMPLANT
BALLOON ~~LOC~~ EUPHORA RX 2.5X15 (BALLOONS) IMPLANT
BALLOON ~~LOC~~ EUPHORA RX 3.0X12 (BALLOONS) IMPLANT
BALLOON ~~LOC~~ EUPHORA RX 3.5X12 (BALLOONS) IMPLANT
CATH INFINITI 5 FR 3DRC (CATHETERS) IMPLANT
CATH INFINITI 5 FR AR1 MOD (CATHETERS) IMPLANT
CATH INFINITI 5FR AL1 (CATHETERS) IMPLANT
CATH INFINITI AMBI 5FR TG (CATHETERS) IMPLANT
CATH INFINITI JR4 5F (CATHETERS) IMPLANT
CATH LAUNCHER 6FR EBU 3 (CATHETERS) IMPLANT
CATH LAUNCHER 6FR EBU3.5 (CATHETERS) IMPLANT
CATH OPTICROSS HD (CATHETERS) IMPLANT
DEVICE RAD COMP TR BAND LRG (VASCULAR PRODUCTS) IMPLANT
GLIDESHEATH SLEND A-KIT 6F 22G (SHEATH) IMPLANT
GUIDEWIRE INQWIRE 1.5J.035X260 (WIRE) IMPLANT
INQWIRE 1.5J .035X260CM (WIRE) ×1
KIT ENCORE 26 ADVANTAGE (KITS) IMPLANT
KIT HEMO VALVE WATCHDOG (MISCELLANEOUS) IMPLANT
KIT SYRINGE INJ CVI SPIKEX1 (MISCELLANEOUS) IMPLANT
PACK CARDIAC CATHETERIZATION (CUSTOM PROCEDURE TRAY) ×1 IMPLANT
SET ATX-X65L (MISCELLANEOUS) IMPLANT
SHEATH PROBE COVER 6X72 (BAG) IMPLANT
SLED PULL BACK IVUS (MISCELLANEOUS) IMPLANT
STENT ONYX FRONTIER 2.25X30 (Permanent Stent) IMPLANT
WIRE ASAHI PROWATER 180CM (WIRE) IMPLANT
WIRE RUNTHROUGH .014X180CM (WIRE) IMPLANT

## 2023-09-08 NOTE — H&P (Signed)
OV 09/02/2023 copied for documentation   Cardiology Office Note:  .   Date:  09/08/2023  ID:  Patrick Cooley, DOB 17-Jan-1956, MRN [id number] PCP: Patrick Hazel, MD  [facility] Providers Cardiologist:  Patrick Brass, MD   }   History of Present Illness: .   Patrick Cooley is a 67 y.o. male with history of mitral valve regurgitation, precordial pain, hypertensive response to exercise, right bundle branch block, hyperlipidemia, OSA, and type 2 diabetes.  Last seen in the office on 08/03/2023 by Patrick Person DNP at which time a cardiac CTA was ordered.  He is here to discuss results.   Since being seen last patient continues to have exertional chest tightness and shortness of breath.  He states he likes to walk with his wife who has had bypass surgery and she is doing better than he is.  He feels very tired, with midsternal chest tightness with exertion.  He also states that he feels it with minimal exertion walking in his home.  Resting resolves quickly.  He denies dizziness, palpitations, or near syncope.  ROS: As above otherwise negative.  Studies Reviewed: .        Cardiac CTA 08/10/2023 1. Left Main: No significant stenosis.   2. LAD: No significant stenosis.  FFR 0.62 distal LAD. 3. LCX: No significant stenosis.  FFR 0.85 distal LCx. 4. RCA: No significant stenosis.   IMPRESSION: 1. CT FFR analysis showed a hemodynamically significant stenosis in the mid LAD distal to origin of a large diagonal. LAD is small in caliber at this point.  ADDENDUM: After viewing CT FFR data, the LAD needs to be re-interpreted. The noncalcified plaque mentioned in the mid LAD on the original report does not appear to be hemodynamically significant. However, just past this stenosis the LAD trifurcates into a large diagonal branch, a small diagonal branch, and the continuation of the actual LAD. The LAD continuation is smaller in caliber than the large diagonal, but has a severe, hemodynamically  significant stenosis (FFR 0.62 in the distal LAD).   Patrick Cooley   EKG Interpretation Date/Time:    Ventricular Rate:    PR Interval:    QRS Duration:    QT Interval:    QTC Calculation:   R Axis:      Text Interpretation:     Physical Exam:   VS:  BP 116/75   Pulse 67   Temp 98.2 F (36.8 C) (Oral) Comment: Vitals taken by Patrick Cooley NT  Resp 18   Ht 5\' 4"  (1.626 m)   Wt 90.7 kg   SpO2 96%   BMI 34.33 kg/m    Wt Readings from Last 3 Encounters:  09/08/23 90.7 kg  09/03/23 91.7 kg  08/03/23 91.9 kg    GEN: Well nourished, well developed in no acute distress central obesity is noted. NECK: No JVD; No carotid bruits CARDIAC: RRR, no murmurs, rubs, gallops RESPIRATORY:  Clear to auscultation without rales, wheezing or rhonchi  ABDOMEN: Soft, non-tender, non-distended EXTREMITIES:  No edema; No deformity   ASSESSMENT AND PLAN: .   Abnormal coronary CTA: None hemodynamically significant stenosis in the LAD with noncalcified plaque however past this stenosis the LAD trifurcates into 2 large diagonal branch and small diagonal branch which is a continuation of the actual LAD.  The LAD continuation is smaller in caliber than the large diagonal but has severe hemodynamically significant stenosis.      I have discussed this with the patient.  We have talked about  medical management with addition of isosorbide versus cardiac catheterization.  The patient is adamant about moving forward with cardiac catheterization.  He would like to have this done as soon as possible.  He is being scheduled with Dr.Everley Cooley on Wednesday, October 23, at 8:30 AM.  The patient has been advised to hold [facility], last dose was 1 week ago Sunday, and therefore he will not take it again this Sunday.  He is advised to hold Farxiga for 48 hours prior to the procedure as well as metformin day prior day of procedure.  He is given these instructions and verbalizes understanding.  2.  Hypercholesterolemia: The  patient remains on atorvastatin 20 mg daily.  I have explained to him this may need to be adjusted based upon results of cardiac catheterization.  He verbalizes understanding.  3.  Hypertension: Remains on losartan 25 mg dail 100 mg daily..  Blood pressure is well-controlled today.    Informed Consent   Shared Decision Making/Informed Consent The risks [stroke (1 in 1000), death (1 in 1000), kidney failure [usually temporary] (1 in 500), bleeding (1 in 200), allergic reaction [possibly serious] (1 in 200)], benefits (diagnostic support and management of coronary artery disease) and alternatives of a cardiac catheterization were discussed in detail with Patrick Cooley and he is willing to proceed.     Dispo:   Patrick Cooley. [name], ANP, AACC

## 2023-09-08 NOTE — CV Procedure (Signed)
Mid LAD PCI Onyx 2.25X30 mm DES Post dilated proximally with 3.5X12 mm [place] balloon High contrast usage due to difficulty engaging anomalous Lcx, need for multiple rounds of post dilatation for proximal approximation, unusual appearance of contrast dye hang up in small septal unrelated to to PCI. This appearance is due to difficulty clearing dye in a very small and diffusely disease septal branch, that takes off at an acute angle and has poor outflow.   Will monitor in holding for a 1 hour and then in short stay for another 5 hours. Contrast use: 315 cc Fluids 150 cc/hr for 6 hours. Full report to follow.  Elder Negus, MD

## 2023-09-08 NOTE — Interval H&P Note (Signed)
History and Physical Interval Note:  09/08/2023 7:58 AM  Patrick Cooley  has presented today for surgery, with the diagnosis of cad.  The various methods of treatment have been discussed with the patient and family. After consideration of risks, benefits and other options for treatment, the patient has consented to  Procedure(s): LEFT HEART CATH AND CORONARY ANGIOGRAPHY (N/A) as a surgical intervention.  The patient's history has been reviewed, patient examined, no change in status, stable for surgery.  I have reviewed the patient's chart and labs.  Questions were answered to the patient's satisfaction.     Denyce Harr J Jeromy Borcherding

## 2023-09-08 NOTE — Progress Notes (Signed)
CARDIAC REHAB PHASE I     Post stent education including site care, restrictions, risk factors, exercise guidelines, NTG use, antiplatelet therapy importance, heart healthy diabetic diet and CRP2 reviewed. All questions and concerns addressed. Will refer to [facility] for CRP2. Plan for home later today.    [phone] Woodroe Chen, RN BSN 09/08/2023 2:18 PM

## 2023-09-08 NOTE — Discharge Instructions (Signed)
Radial Site Care  This sheet gives you information about how to care for yourself after your procedure. Your health care provider may also give you more specific instructions. If you have problems or questions, contact your health care provider. What can I expect after the procedure? After the procedure, it is common to have: Bruising and tenderness at the catheter insertion area. Follow these instructions at home: Medicines Take over-the-counter and prescription medicines only as told by your health care provider. Insertion site care Follow instructions from your health care provider about how to take care of your insertion site. Make sure you: Wash your hands with soap and water before you remove your bandage (dressing). If soap and water are not available, use hand sanitizer. May remove dressing in 24 hours. Check your insertion site every day for signs of infection. Check for: Redness, swelling, or pain. Fluid or blood. Pus or a bad smell. Warmth. Do no take baths, swim, or use a hot tub for 5 days. You may shower 24-48 hours after the procedure. Remove the dressing and gently wash the site with plain soap and water. Pat the area dry with a clean towel. Do not rub the site. That could cause bleeding. Do not apply powder or lotion to the site. Activity  For 24 hours after the procedure, or as directed by your health care provider: Do not flex or bend the affected arm. Do not push or pull heavy objects with the affected arm. Do not drive yourself home from the hospital or clinic. You may drive 24 hours after the procedure. Do not operate machinery or power tools. KEEP ARM ELEVATED THE REMAINDER OF THE DAY. Do not push, pull or lift anything that is heavier than 10 lb for 5 days. Ask your health care provider when it is okay to: Return to work or school. Resume usual physical activities or sports. Resume sexual activity. General instructions If the catheter site starts to  bleed, raise your arm and put firm pressure on the site. If the bleeding does not stop, get help right away. This is a medical emergency. DRINK PLENTY OF FLUIDS FOR THE NEXT 2-3 DAYS. No alcohol consumption for 24 hours after receiving sedation. If you went home on the same day as your procedure, a responsible adult should be with you for the first 24 hours after you arrive home. Keep all follow-up visits as told by your health care provider. This is important. Contact a health care provider if: You have a fever. You have redness, swelling, or yellow drainage around your insertion site. Get help right away if: You have unusual pain at the radial site. The catheter insertion area swells very fast. The insertion area is bleeding, and the bleeding does not stop when you hold steady pressure on the area. Your arm or hand becomes pale, cool, tingly, or numb. These symptoms may represent a serious problem that is an emergency. Do not wait to see if the symptoms will go away. Get medical help right away. Call your local emergency services (911 in the [place] Do not drive yourself to the hospital. Summary After the procedure, it is common to have bruising and tenderness at the site. Follow instructions from your health care provider about how to take care of your radial site wound. Check the wound every day for signs of infection.  This information is not intended to replace advice given to you by your health care provider. Make sure you discuss any questions you have with   your health care provider. Document Revised: 12/08/2017 Document Reviewed: 12/08/2017 Elsevier Patient Education  2020 [employer]  

## 2023-09-08 NOTE — Discharge Summary (Incomplete)
Discharge Summary for Same Day PCI   Patient ID: Patrick Cooley MRN: [id number]; DOB: 05/23/1956  Admit date: 09/08/2023 Discharge date: 09/08/2023  Primary Care Provider: Sigmund Hazel, MD  Primary Cardiologist: Parke Poisson, MD  Primary Electrophysiologist:  None   Discharge Diagnoses    Active Problems:   CAD (coronary artery disease)   Hyperlipidemia   Type 2 diabetes mellitus with complication, without long-term current use of insulin [facility])   Diagnostic Studies/Procedures    Cardiac Catheterization 09/08/2023:  *** _____________   History of Present Illness     Patrick Cooley is a 67 y.o. male with history of mitral valve regurgitation, precordial pain, right bundle branch block, hyperlipidemia, OSA, diabetes.  Followed by Dr. Jacques Navy as an outpatient.  Seen in the office 9/17 with Bernadene Person, DNP and was set up for a coronary CTA.  This was completed 9/24 and sent for CT FFR which showed hemodynamically significant stenosis in the mid LAD at the origin of large diagonal.  Was recommended that the patient present for outpatient cardiac catheterization.  Hospital Course     The patient underwent cardiac cath as noted above with ***. Plan for DAPT with ASA/plavix for at least ***. The patient was seen by cardiac rehab while in short stay. There were no observed complications post cath. Radial cath site was re-evaluated prior to discharge and found to be stable without any complications. Instructions/precautions regarding cath site care were given prior to discharge.  Patrick Cooley was seen by Dr. Rosemary Holms and determined stable for discharge home. Follow up with our office has been arranged. Medications are listed below. Pertinent changes include addition of plavix, increased atorvastatin to 40mg  daily, added protonix.    _____________  Cath/PCI Registry Performance & Quality Measures: Aspirin prescribed? - Yes ADP Receptor Inhibitor (Plavix/Clopidogrel,  Brilinta/Ticagrelor or Effient/Prasugrel) prescribed (includes medically managed patients)? - Yes High Intensity Statin (Lipitor 40-80mg  or Crestor 20-40mg ) prescribed? - Yes For EF <40%, was ACEI/ARB prescribed? - Not Applicable (EF >/= 40%) For EF <40%, Aldosterone Antagonist (Spironolactone or Eplerenone) prescribed? - Not Applicable (EF >/= 40%) Cardiac Rehab Phase II ordered (Included Medically managed Patients)? - Yes  _____________   Discharge Vitals Blood pressure 102/76, pulse 64, temperature 98.2 F (36.8 C), temperature source Oral, resp. rate 19, height 5\' 4"  (1.626 m), weight 90.7 kg, SpO2 96%.  Filed Weights   09/08/23 0719  Weight: 90.7 kg    Last Labs & Radiologic Studies    CBC No results for input(s): "WBC", "NEUTROABS", "HGB", "HCT", "MCV", "PLT" in the last 72 hours. Basic Metabolic Panel No results for input(s): "NA", "K", "CL", "CO2", "GLUCOSE", "BUN", "CREATININE", "CALCIUM", "MG", "PHOS" in the last 72 hours. Liver Function Tests No results for input(s): "AST", "ALT", "ALKPHOS", "BILITOT", "PROT", "ALBUMIN" in the last 72 hours. No results for input(s): "LIPASE", "AMYLASE" in the last 72 hours. High Sensitivity Troponin:   No results for input(s): "TROPONINIHS" in the last 720 hours.  BNP Invalid input(s): "POCBNP" D-Dimer No results for input(s): "DDIMER" in the last 72 hours. Hemoglobin A1C No results for input(s): "HGBA1C" in the last 72 hours. Fasting Lipid Panel No results for input(s): "CHOL", "HDL", "LDLCALC", "TRIG", "CHOLHDL", "LDLDIRECT" in the last 72 hours. Thyroid Function Tests No results for input(s): "TSH", "T4TOTAL", "T3FREE", "THYROIDAB" in the last 72 hours.  Invalid input(s): "FREET3" _____________  CT CORONARY MORPH W/CTA COR W/SCORE W/CA W/CM &/OR WO/CM  Addendum Date: 08/11/2023   ADDENDUM REPORT: 08/11/2023 11:12 EXAM: OVER-READ INTERPRETATION  CT  CHEST The following report is an over-read performed by radiologist Dr. Roque Lias [facility] on 08/11/2023. This over-read does not include interpretation of cardiac or coronary anatomy or pathology. The coronary CTA interpretation by the cardiologist is attached. COMPARISON:  None. FINDINGS: No significant abnormality seen involving the visualized portions of the extracardiac vascular structures. Visualized mediastinum is unremarkable. Visualized portion of upper abdomen is unremarkable. Visualized pulmonary parenchyma is unremarkable. Visualized skeleton is unremarkable. IMPRESSION: No definite abnormality seen involving the visualized portions of the extracardiac structures in the chest. Electronically Signed   By: Lupita Raider M.D.   On: 08/11/2023 11:12   Addendum Date: 08/10/2023   ADDENDUM REPORT: 08/10/2023 17:55 ADDENDUM: After viewing CT FFR data, the LAD needs to be re-interpreted. The noncalcified plaque mentioned in the mid LAD on the original report does not appear to be hemodynamically significant. However, just past this stenosis the LAD trifurcates into a large diagonal branch, a small diagonal branch, and the continuation of the actual LAD. The LAD continuation is smaller in caliber than the large diagonal, but has a severe, hemodynamically significant stenosis (FFR 0.62 in the distal LAD). Patrick Cooley Electronically Signed   By: Marca Ancona M.D.   On: 08/10/2023 17:55   Result Date: 08/11/2023 CLINICAL DATA:  Chest pain EXAM: Cardiac CTA MEDICATIONS: Sub lingual nitro. 4mg  x 2 TECHNIQUE: The patient was scanned on a Siemens 192 slice scanner. Gantry rotation speed was 250 msecs. Collimation was 0.6 mm. A 100 kV prospective scan was triggered in the ascending thoracic aorta at 35-75% of the R-R interval. Average HR during the scan was 60 bpm. The 3D data set was interpreted on a dedicated work station using MPR, MIP and VRT modes. A total of 80cc of contrast was used. FINDINGS: Non-cardiac: See separate report from [facility].  Pulmonary veins drain normally to the left atrium. Calcium Score: 312 Agatston units. 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 LAD system: The LAD originates from the left cusp. Serial mixed plaque mild stenoses in the proximal LAD (25-49%), noncalcified plaque in the mid LAD with possible moderate (50-69%) stenosis. Circumflex system: Anomalous vessel off the right cusp, follows retro-aortic course to usual LCx territory. Noncalcified plaque in the mid LCx with mild (1-24%) stenosis. RCA system: Calcified plaque in the mid and distal RCA, mild (1-24%) stenosis. IMPRESSION: 1. Coronary artery calcium score 312 Agatston units. This places the patient in the 75th percentile for age and gender, suggesting intermediate risk for future cardiac events. 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 3. Possible moderate (50-69%) stenosis with noncalcified plaque in the mid LAD. Study sent for FFR to confirm hemodynamic significance. Patrick Cooley Electronically Signed: By: Marca Ancona M.D. On: 08/10/2023 17:21   CT CORONARY FFR DATA PREP & FLUID ANALYSIS  Result Date: 08/11/2023 EXAM: CT FFR ANALYSIS MEDICATIONS: MEDICATIONS None FINDINGS: CT FFR analysis was performed on the original cardiac computed tomography angiogram dataset. Diagrammatic representation of the CT FFR analysis is provided in a separate PDF document in PACS. This dictation was created using the PDF document and an interactive 3D model of the results. The 3D model is not available in the EMR/PACS. Normal CT FFR range is >0.80. 1. Left Main: No significant stenosis. 2. LAD: No significant stenosis.  FFR 0.62 distal LAD. 3. LCX: No significant stenosis.  FFR 0.85 distal LCx.  4. RCA: No significant stenosis. IMPRESSION: 1. CT FFR analysis showed a hemodynamically significant stenosis in the mid LAD distal to origin of a large  diagonal. LAD is small in caliber at this point. Marca Ancona, MD Electronically Signed   By: Marca Ancona M.D.   On: 08/11/2023 07:35    Disposition   Pt is being discharged home today in good condition.  Follow-up Plans & Appointments     Follow-up Information     Azalee Course, [place] Follow up on 09/22/2023.   Specialties: Cardiology, Radiology Why: at 2:20pm for your follow up appt with cardiology Contact information: [address] [place] [address] [phone]                Discharge Instructions     AMB Referral to Cardiac Rehabilitation - Phase II   Complete by: As directed    Diagnosis: Coronary Stents   After initial evaluation and assessments completed: Virtual Based Care may be provided alone or in conjunction with Phase 2 Cardiac Rehab based on patient barriers.: Yes   Intensive Cardiac Rehabilitation (ICR) [facility] location only OR Traditional Cardiac Rehabilitation (TCR) *If criteria for ICR are not met will enroll in TCR [facility] only): Yes        Discharge Medications   Allergies as of 09/08/2023   No Known Allergies      Medication List     STOP taking these medications    esomeprazole 40 MG capsule Commonly known as: NEXIUM   naproxen sodium 220 MG tablet Commonly known as: ALEVE       TAKE these medications    alfuzosin 10 MG 24 hr tablet Commonly known as: UROXATRAL Take 10 mg by mouth at bedtime.   aspirin EC 81 MG tablet Take 81 mg by mouth at bedtime. Swallow whole.   atorvastatin 80 MG tablet Commonly known as: LIPITOR Take 1 tablet (80 mg total) by mouth every morning. What changed:  medication strength how much to take   cetirizine 10 MG tablet Commonly known as: ZYRTEC Take 10 mg by mouth every morning.   clopidogrel 75 MG tablet Commonly known as: PLAVIX Take 1 tablet (75 mg total) by mouth daily with breakfast. Start taking on: September 09, 2023   cyanocobalamin 1000 MCG tablet Commonly known as:  VITAMIN B12 Take 1,000 mcg by mouth at bedtime.   dapagliflozin propanediol 10 MG Tabs tablet Commonly known as: FARXIGA Take 10 mg by mouth every morning.   ferrous sulfate 325 (65 FE) MG tablet Take 325 mg by mouth at bedtime.   fluticasone 50 MCG/ACT nasal spray Commonly known as: FLONASE Place 2 sprays into both nostrils 2 (two) times daily.   ipratropium 0.03 % nasal spray Commonly known as: ATROVENT Place 2 sprays into both nostrils 4 (four) times daily.   losartan 25 MG tablet Commonly known as: COZAAR Take 1 tablet (25 mg total) by mouth daily.   metFORMIN 1000 MG tablet Commonly known as: GLUCOPHAGE Take 1,000 mg by mouth 2 (two) times daily with a meal.   montelukast 10 MG tablet Commonly known as: SINGULAIR Take 10 mg by mouth at bedtime.   multivitamin with minerals tablet Take 1 tablet by mouth every morning.   naphazoline-glycerin 0.012-0.25 % Soln Commonly known as: CLEAR EYES REDNESS Place 1-2 drops into both eyes 4 (four) times daily as needed for eye irritation.   nitroGLYCERIN 0.4 MG SL tablet Commonly known as: NITROSTAT Place 1 tablet (0.4 mg total) under the  tongue every 5 (five) minutes as needed for chest pain.   pantoprazole 40 MG tablet Commonly known as: Protonix Take 1 tablet (40 mg total) by mouth daily.   polycarbophil 625 MG tablet Commonly known as: FIBERCON Take 1,250 mg by mouth 2 (two) times daily.   sertraline 50 MG tablet Commonly known as: ZOLOFT Take 50 mg by mouth every morning.   sucralfate 1 g tablet Commonly known as: CARAFATE Take 1 g by mouth daily as needed (Stomach acid).   tadalafil 5 MG tablet Commonly known as: CIALIS Take 5 mg by mouth at bedtime.   tirzepatide 10 MG/0.5ML Pen Commonly known as: MOUNJARO Inject 10 mg into the skin once a week.         Allergies No Known Allergies  Outstanding Labs/Studies   FLP/LFTs in 8 weeks   Duration of Discharge Encounter   Greater than 30 minutes  including physician time.  Signed, Laverda Page, NP 09/08/2023, 1:07 PM

## 2023-09-09 ENCOUNTER — Other Ambulatory Visit (HOSPITAL_COMMUNITY): Payer: Self-pay

## 2023-09-09 ENCOUNTER — Encounter (HOSPITAL_COMMUNITY): Payer: Self-pay | Admitting: Cardiology

## 2023-09-09 DIAGNOSIS — I25118 Atherosclerotic heart disease of native coronary artery with other forms of angina pectoris: Secondary | ICD-10-CM | POA: Diagnosis not present

## 2023-09-09 DIAGNOSIS — E7800 Pure hypercholesterolemia, unspecified: Secondary | ICD-10-CM | POA: Diagnosis not present

## 2023-09-09 DIAGNOSIS — E118 Type 2 diabetes mellitus with unspecified complications: Secondary | ICD-10-CM

## 2023-09-09 DIAGNOSIS — E119 Type 2 diabetes mellitus without complications: Secondary | ICD-10-CM | POA: Diagnosis not present

## 2023-09-09 DIAGNOSIS — I2510 Atherosclerotic heart disease of native coronary artery without angina pectoris: Secondary | ICD-10-CM | POA: Diagnosis not present

## 2023-09-09 DIAGNOSIS — I10 Essential (primary) hypertension: Secondary | ICD-10-CM | POA: Diagnosis not present

## 2023-09-09 LAB — CBC
HCT: 39.4 % (ref 39.0–52.0)
Hemoglobin: 13.3 g/dL (ref 13.0–17.0)
MCH: 28.3 pg (ref 26.0–34.0)
MCHC: 33.8 g/dL (ref 30.0–36.0)
MCV: 83.8 fL (ref 80.0–100.0)
Platelets: 199 10*3/uL (ref 150–400)
RBC: 4.7 MIL/uL (ref 4.22–5.81)
RDW: 12.3 % (ref 11.5–15.5)
WBC: 7.3 10*3/uL (ref 4.0–10.5)
nRBC: 0 % (ref 0.0–0.2)

## 2023-09-09 LAB — BASIC METABOLIC PANEL
Anion gap: 10 (ref 5–15)
BUN: 11 mg/dL (ref 8–23)
CO2: 23 mmol/L (ref 22–32)
Calcium: 8.3 mg/dL — ABNORMAL LOW (ref 8.9–10.3)
Chloride: 107 mmol/L (ref 98–111)
Creatinine, Ser: 0.83 mg/dL (ref 0.61–1.24)
GFR, Estimated: >60 mL/min (ref >60)
Glucose, Bld: 181 mg/dL — ABNORMAL HIGH (ref 70–99)
Potassium: 3.6 mmol/L (ref 3.5–5.1)
Sodium: 140 mmol/L (ref 135–145)

## 2023-09-09 NOTE — Progress Notes (Signed)
Pt removed tele box. Sts he will be going home soon. Education given, pt verb understanding.

## 2023-09-09 NOTE — Plan of Care (Signed)

## 2023-09-09 NOTE — Plan of Care (Signed)

## 2023-09-09 NOTE — Progress Notes (Signed)
Explained discharge instructions to patient. Reviewed follow up appointment and next medication administration times. Also reviewed education. Patient verbalized having an understanding for instructions given. All belongings are in the patient's possession to include TOC meds. IV and telemetry were removed by the patient's RN. C No other needs verbalized. Transporting out to car  for discharge.

## 2023-09-09 NOTE — Progress Notes (Signed)
Pt being d/c.  PIVs removed Discharge information given.  Pt verb understanding and had no further questions  

## 2023-09-10 ENCOUNTER — Telehealth (HOSPITAL_COMMUNITY): Payer: Self-pay

## 2023-09-10 NOTE — Telephone Encounter (Signed)
Pt insurance is active and benefits verified through [employer]. Co-pay $15, DED 0/0 met, out of pocket $3,200/$690.17 met, co-insurance 0%. no pre-authorization required, Valerie/[employer] 09/10/2023@1 :34, REF# [id number]  How many CR sessions are covered? (for ICR)72 Is this a lifetime maximum or an annual maximum? annual Has the member used any of these services to date? no Is there a time limit (weeks/months) on start of program and/or program completion? no  Will contact patient to see if he is interested in the Cardiac Rehab Program. If interested, patient will need to complete follow up appt. Once completed, patient will be contacted for scheduling upon review by the RN Navigator.

## 2023-09-10 NOTE — Telephone Encounter (Signed)
Called patient to see if he is interested in the Cardiac Rehab Program. Patient expressed interest. Explained scheduling process and went over insurance, patient verbalized understanding. Will contact patient for scheduling once f/u has been completed.  °

## 2023-09-12 LAB — LIPOPROTEIN A (LPA): Lipoprotein (a): <8.4 nmol/L (ref <75.0)

## 2023-09-14 ENCOUNTER — Ambulatory Visit: Payer: PPO | Admitting: Nurse Practitioner

## 2023-09-22 ENCOUNTER — Encounter: Payer: Self-pay | Admitting: Physician Assistant

## 2023-09-22 ENCOUNTER — Ambulatory Visit: Payer: PPO | Attending: Physician Assistant | Admitting: Physician Assistant

## 2023-09-22 VITALS — BP 100/58 | HR 75 | Ht 66.0 in | Wt 199.0 lb

## 2023-09-22 DIAGNOSIS — E119 Type 2 diabetes mellitus without complications: Secondary | ICD-10-CM | POA: Diagnosis not present

## 2023-09-22 DIAGNOSIS — I10 Essential (primary) hypertension: Secondary | ICD-10-CM | POA: Diagnosis not present

## 2023-09-22 DIAGNOSIS — E785 Hyperlipidemia, unspecified: Secondary | ICD-10-CM

## 2023-09-22 DIAGNOSIS — M25519 Pain in unspecified shoulder: Secondary | ICD-10-CM

## 2023-09-22 DIAGNOSIS — I2510 Atherosclerotic heart disease of native coronary artery without angina pectoris: Secondary | ICD-10-CM

## 2023-09-22 DIAGNOSIS — G4733 Obstructive sleep apnea (adult) (pediatric): Secondary | ICD-10-CM | POA: Diagnosis not present

## 2023-09-22 NOTE — Patient Instructions (Signed)
Medication Instructions:  NO CHANGES *If you need a refill on your cardiac medications before your next appointment, please call your pharmacy*   Lab Work: NO LABS If you have labs (blood work) drawn today and your tests are completely normal, you will receive your results only by: MyChart Message (if you have MyChart) OR A paper copy in the mail If you have any lab test that is abnormal or we need to change your treatment, we will call you to review the results.   Testing/Procedures: NO TESTING   Follow-Up: At [facility], you and your health needs are our priority.  As part of our continuing mission to provide you with exceptional heart care, we have created designated Provider Care Teams.  These Care Teams include your primary Cardiologist (physician) and Advanced Practice Providers (APPs -  Physician Assistants and Nurse Practitioners) who all work together to provide you with the care you need, when you need it.  Your next appointment:   4-5 month(s)  Provider:   Parke Poisson, MD

## 2023-09-22 NOTE — Progress Notes (Signed)
Cardiology Office Note:  .   Date:  09/24/2023  ID:  Patrick Cooley, DOB Jan 26, 1956, MRN [id number] PCP: Sigmund Hazel, MD  [facility] Providers Cardiologist:  Parke Poisson, MD     History of Present Illness: .   Patrick Cooley is a 67 y.o. male with past medical history of mitral valve regurgitation, RBBB, CAD, hypertension, hyperlipidemia, DM2 and OSA.  Patient was previously seen in September 2020 for for exertional chest tightness and shortness of breath, a coronary CT was ordered and was done on 08/10/2023, this showed 25 to 49% disease in proximal LAD, 50 to 69% disease in mid LAD, 1 to 24% disease in mid left circumflex artery, 1 to 24% disease in mid to distal RCA.  Subsequent FFR analysis obtained on 08/10/2023 showed hemodynamically significant lesion in mid LAD distal to the origin of a large diagonal vessel.  She was seen by Joni Reining, NP on 09/03/2023 at which time he was still symptomatic, they discussed the possibility of medication management versus cardiac catheterization.  Patient ultimately underwent cardiac catheterization on 09/08/2023, this showed 99% LAD lesion treated with 2.25 x 30 mm Onyx DES.  The procedure was prolonged due to difficulty engaging anomalous left circumflex artery.  Postprocedure, patient was kept in the hospital for observation.  He was placed on aspirin and Plavix.  Atorvastatin was increased to 40 mg daily.  Protonix was added.  Patient was ultimately discharged on the following day.  Recent blood work shows LDL 65, total cholesterol 478, HDL 33, triglyceride 154.  Patient presents today for follow-up.  He he denies any chest pain or shortness of breath.  After the procedure, he had a red spot on his chest, I was not sure if this represented a radiation burn given prolonged procedure.  However this red spot has completely resolved.  He is okay to start exercising.  He has no chest pain, shortness of breath, lower extremity edema,  orthopnea or PND.  He can follow-up with Dr. Jacques Navy in 4 to 5 months.  ROS:   He denies chest pain, palpitations, dyspnea, pnd, orthopnea, n, v, dizziness, syncope, edema, weight gain, or early satiety. All other systems reviewed and are otherwise negative except as noted above.    Studies Reviewed: .        Cardiac Studies & Procedures   CARDIAC CATHETERIZATION  CARDIAC CATHETERIZATION 09/08/2023  Narrative Images from the original result were not included. 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 No significant disease  LVEDP 12 mmHg  Successful percutaneous coronary intervention mid LAD Intravascular ultrasound (IVUS) PTCA and stent placement 2.25 X 30 mm Onyx drug-eluting stent Serial post dilatation from distal to proximal part of the stent using 2.5 mm & 3.5 mm [place] balloons  MSA 6.3 mm2 in distal part of the stent and >10 mm2 in proximal part of the stent    Unusually prolonged procedure time, radiation time, and contrast use due to difficulty engaging anomalous left circumflex artery, time spent in optimizing LAD stent that had large size mismatch between distal and proximal vessel, requiring multiple rounds of IVUS and multiple rounds of [place] balloon post dilatation, as well as need to obtain multiple pictures in orthogonal views to adequately study unusual appearance  of contrast dye hangup in septal branch unrelated to the intervention.  Recommend overnight stay with IV hydration to avoid contrast induced nephropathy  Elder Negus, MD  Findings Coronary Findings Diagnostic  Dominance: Right  Left Main Dist LM lesion is 20% stenosed.  Left Anterior Descending Mid LAD lesion is 99%  stenosed.  First Septal Branch Vessel is small in size. 1st Sept lesion is 90% stenosed.  Second Septal Branch Vessel is small in size. 2nd Sept lesion is 70% stenosed.  Intervention  Mid LAD lesion Stent CATH LAUNCHER 6FR EBU 3 guide catheter was inserted. Lesion crossed with guidewire using a WIRE RUNTHROUGH .[id number]. Pre-stent angioplasty was performed using a BALLN [place] EUPHORA RX 3.0X12. A drug-eluting stent was successfully placed using a STENT ONYX FRONTIER 2.25X30. Maximum pressure: 14 atm. Inflation time: 20 sec. Stent strut is well apposed. Post-stent angioplasty was performed using a BALLN  EUPHORA RX 3.5X12. Maximum pressure:  16 atm. Inflation time:  20 sec. Post-Intervention Lesion Assessment The intervention was successful. Pre-interventional TIMI flow is 1. Post-intervention TIMI flow is 3. Ultrasound (IVUS) was performed on the lesion post PCI using a CATH OPTICROSS HD. Stent well expanded. There is a 0% residual stenosis post intervention.   STRESS TESTS  EXERCISE TOLERANCE TEST (ETT) 08/11/2022  Narrative   No ST deviation was noted.  ETT with good exercise capacity (10:30); no chest pain; normal blood pressure response; no diagnostic ST changes; negative adequate exercise treadmill.   ECHOCARDIOGRAM  ECHOCARDIOGRAM COMPLETE 07/29/2023  Narrative ECHOCARDIOGRAM REPORT    Patient Name:   Patrick Cooley   Date of Exam: 07/29/2023 Medical Rec #:  [id number]     Height:       65.0 in Accession #:    [id number]    Weight:       214.0 lb Date of Birth:  1956/04/22    BSA:          2.036 m Patient Age:    66 years      BP:           139/79 mmHg Patient Gender: M             HR:           71 bpm. Exam Location:  [address]  Procedure: 2D Echo, 3D Echo, Cardiac Doppler and Color Doppler  Indications:    I34.0 Mitral Valve insufficiency  History:        Patient has prior history of Echocardiogram examinations, most recent 07/28/2022. Risk Factors:Diabetes, HLD  and Sleep Apnea.  Sonographer:    Clearence Ped RCS Referring Phys: [id number] Lynda Rainwater A ACHARYA  IMPRESSIONS   1. Left ventricular ejection fraction, by estimation, is 55 to 60%. Left ventricular ejection fraction by 3D volume is 60 %. The left ventricle has normal function. The left ventricle has no regional wall motion abnormalities. There is mild asymmetric left ventricular hypertrophy of the basal-septal segment (13 mm). Left ventricular diastolic parameters are consistent with Grade I diastolic dysfunction (impaired relaxation). 2. Right ventricular systolic function is normal. The right ventricular size is normal. There is normal pulmonary artery systolic pressure. The estimated right ventricular systolic pressure is 26.0 mmHg. 3. Mild posterior leaflet prolapse. The mitral valve is grossly normal. Mild to moderate mitral valve regurgitation. No evidence of mitral stenosis. 4. The aortic valve is tricuspid. Aortic valve regurgitation is trivial. No aortic stenosis is present. 5. The inferior vena cava is normal in size with greater than 50% respiratory variability, suggesting  right atrial pressure of 3 mmHg.  FINDINGS Left Ventricle: Left ventricular ejection fraction, by estimation, is 55 to 60%. Left ventricular ejection fraction by 3D volume is 60 %. The left ventricle has normal function. The left ventricle has no regional wall motion abnormalities. The left ventricular internal cavity size was normal in size. There is mild asymmetric left ventricular hypertrophy of the basal-septal segment. Left ventricular diastolic parameters are consistent with Grade I diastolic dysfunction (impaired relaxation).  Right Ventricle: The right ventricular size is normal. No increase in right ventricular wall thickness. Right ventricular systolic function is normal. There is normal pulmonary artery systolic pressure. The tricuspid regurgitant velocity is 2.40 m/s, and with an assumed right atrial pressure  of 3 mmHg, the estimated right ventricular systolic pressure is 26.0 mmHg.  Left Atrium: Left atrial size was normal in size.  Right Atrium: Right atrial size was normal in size.  Pericardium: There is no evidence of pericardial effusion.  Mitral Valve: Mild posterior leaflet prolapse. The mitral valve is grossly normal. Mild to moderate mitral valve regurgitation. No evidence of mitral valve stenosis.  Tricuspid Valve: The tricuspid valve is normal in structure. Tricuspid valve regurgitation is mild . No evidence of tricuspid stenosis.  Aortic Valve: The aortic valve is tricuspid. Aortic valve regurgitation is trivial. Aortic regurgitation PHT measures 452 msec. No aortic stenosis is present.  Pulmonic Valve: The pulmonic valve was normal in structure. Pulmonic valve regurgitation is trivial. No evidence of pulmonic stenosis.  Aorta: The aortic root is normal in size and structure.  Venous: The inferior vena cava is normal in size with greater than 50% respiratory variability, suggesting right atrial pressure of 3 mmHg.  IAS/Shunts: No atrial level shunt detected by color flow Doppler.   LEFT VENTRICLE PLAX 2D LVIDd:         4.20 cm         Diastology LVIDs:         2.70 cm         LV e' medial:    6.20 cm/s LV PW:         0.90 cm         LV E/e' medial:  17.7 LV IVS:        1.30 cm         LV e' lateral:   8.92 cm/s LVOT diam:     1.90 cm         LV E/e' lateral: 12.3 LV SV:         58 LV SV Index:   28 LVOT Area:     2.84 cm        3D Volume EF LV 3D EF:    Left ventricul ar ejection fraction by 3D volume is 60 %.  3D Volume EF: 3D EF:        60 % LV EDV:       155 ml LV ESV:       63 ml LV SV:        92 ml  RIGHT VENTRICLE RV Basal diam:  2.70 cm RV S prime:     13.40 cm/s TAPSE (M-mode): 1.7 cm RVSP:           26.0 mmHg  LEFT ATRIUM             Index        RIGHT ATRIUM           Index LA diam:  4.30 cm 2.11 cm/m   RA Pressure: 3.00 mmHg LA Vol  (A2C):   85.5 ml 42.00 ml/m  RA Area:     9.70 cm LA Vol (A4C):   42.5 ml 20.88 ml/m  RA Volume:   16.80 ml  8.25 ml/m LA Biplane Vol: 63.9 ml 31.39 ml/m AORTIC VALVE LVOT Vmax:   105.00 cm/s LVOT Vmean:  74.400 cm/s LVOT VTI:    0.204 m AI PHT:      452 msec  AORTA Ao Root diam: 2.70 cm Ao Asc diam:  3.10 cm  MITRAL VALVE                  TRICUSPID VALVE MV Area (PHT):                TR Peak grad:   23.0 mmHg MV Decel Time:                TR Vmax:        240.00 cm/s MR Peak grad:    96.4 mmHg    Estimated RAP:  3.00 mmHg MR Mean grad:    49.0 mmHg    RVSP:           26.0 mmHg MR Vmax:         491.00 cm/s MR Vmean:        312.0 cm/s   SHUNTS MR PISA:         2.65 cm     Systemic VTI:  0.20 m MR PISA Eff ROA: 17 mm       Systemic Diam: 1.90 cm MR PISA Radius:  0.65 cm MV E velocity: 110.00 cm/s MV A velocity: 123.00 cm/s MV E/A ratio:  0.89  Weston Brass MD Electronically signed by Weston Brass MD Signature Date/Time: 07/31/2023/9:48:42 PM    Final     CT SCANS  CT CORONARY MORPH W/CTA COR W/SCORE 08/10/2023  Addendum 08/11/2023 11:15 AM ADDENDUM REPORT: 08/11/2023 11:12  EXAM: OVER-READ INTERPRETATION  CT CHEST  The following report is an over-read performed by radiologist Dr. Roque Lias [facility] on 08/11/2023. This over-read does not include interpretation of cardiac or coronary anatomy or pathology. The coronary CTA interpretation by the cardiologist is attached.  COMPARISON:  None.  FINDINGS: No significant abnormality seen involving the visualized portions of the extracardiac vascular structures. Visualized mediastinum is unremarkable. Visualized portion of upper abdomen is unremarkable. Visualized pulmonary parenchyma is unremarkable. Visualized skeleton is unremarkable.  IMPRESSION: No definite abnormality seen involving the visualized portions of the extracardiac structures in the chest.   Electronically  Signed By: Lupita Raider M.D. On: 08/11/2023 11:12  Addendum 08/10/2023  5:57 PM ADDENDUM REPORT: 08/10/2023 17:55  ADDENDUM: After viewing CT FFR data, the LAD needs to be re-interpreted. The noncalcified plaque mentioned in the mid LAD on the original report does not appear to be hemodynamically significant. However, just past this stenosis the LAD trifurcates into a large diagonal branch, a small diagonal branch, and the continuation of the actual LAD. The LAD continuation is smaller in caliber than the large diagonal, but has a severe, hemodynamically significant stenosis (FFR 0.62 in the distal LAD).  [name]   Electronically Signed By: Marca Ancona M.D. On: 08/10/2023 17:55  Narrative CLINICAL DATA:  Chest pain  EXAM: Cardiac CTA  MEDICATIONS: Sub lingual nitro. 4mg  x 2  TECHNIQUE: The patient was scanned on a Siemens 192 slice scanner. Gantry rotation speed was 250 msecs. Collimation was  0.6 mm. A 100 kV prospective scan was triggered in the ascending thoracic aorta at 35-75% of the R-R interval. Average HR during the scan was 60 bpm. The 3D data set was interpreted on a dedicated work station using MPR, MIP and VRT modes. A total of 80cc of contrast was used.  FINDINGS: Non-cardiac: See separate report from [facility].  Pulmonary veins drain normally to the left atrium.  Calcium Score: 312 Agatston units.  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  LAD system: The LAD originates from the left cusp. Serial mixed plaque mild stenoses in the proximal LAD (25-49%), noncalcified plaque in the mid LAD with possible moderate (50-69%) stenosis.  Circumflex system: Anomalous vessel off the right cusp, follows retro-aortic course to usual LCx territory. Noncalcified plaque in the mid LCx with mild (1-24%) stenosis.  RCA system: Calcified plaque in the mid and distal RCA, mild  (1-24%) stenosis.  IMPRESSION: 1. Coronary artery calcium score 312 Agatston units. This places the patient in the 75th percentile for age and gender, suggesting intermediate risk for future cardiac events.  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  3. Possible moderate (50-69%) stenosis with noncalcified plaque in the mid LAD. Study sent for FFR to confirm hemodynamic significance.  Dalton Sales promotion account executive  Electronically Signed: By: Marca Ancona M.D. On: 08/10/2023 17:21          Risk Assessment/Calculations:            Physical Exam:   VS:  BP (!) 100/58 (BP Location: Right Arm, Patient Position: Sitting, Cuff Size: Normal)   Pulse 75   Ht 5\' 6"  (1.676 m)   Wt 199 lb (90.3 kg)   BMI 32.12 kg/m    Wt Readings from Last 3 Encounters:  09/22/23 199 lb (90.3 kg)  09/08/23 201 lb 8 oz (91.4 kg)  09/03/23 202 lb 3.2 oz (91.7 kg)    GEN: Well nourished, well developed in no acute distress NECK: No JVD; No carotid bruits CARDIAC: RRR, no murmurs, rubs, gallops RESPIRATORY:  Clear to auscultation without rales, wheezing or rhonchi  ABDOMEN: Soft, non-tender, non-distended EXTREMITIES:  No edema; No deformity   ASSESSMENT AND PLAN: .     Coronary Artery Disease Recent cardiac catheterization on 09/08/2023 revealed a 99% mid LAD lesion, treated with a 2.25 x 30mm Onyx DES. Patient is currently asymptomatic. Discussed the importance of consistent Aspirin and Plavix use to prevent stent thrombosis, especially within the first 6 months post-procedure. -Continue Aspirin and Plavix daily. -Maybe able to hold Plavix for 5 days prior to any non-emergent procedures after 03/08/2024 if needed -Do not use nitroglycerin due to concurrent daily Cialis use for prostate issues.  Shoulder Pain Patient has significant shoulder pain and is considering shoulder replacement surgery. Discussed the need to delay surgery until  after 6 months of dual antiplatelet therapy post-cardiac stent placement. -Orthopedic surgeon to send cardiac clearance letter near end of 6 months post-cardiac stent placement.  Hypertension: Blood pressure well-controlled  Hyperlipidemia Recent blood work shows LDL 65, total cholesterol 657, HDL 53, triglycerides 154. -Continue current therapy.  DM2: Managed by primary care provider      Cardiac Rehabilitation Eligibility Assessment  The patient is ready to start cardiac rehabilitation from a cardiac standpoint.       Dispo: Patient to follow up with Dr. Jacques Navy in 4-5 months.  Signed, Azalee Course,  PA

## 2023-09-27 ENCOUNTER — Encounter (HOSPITAL_COMMUNITY): Payer: Self-pay

## 2023-09-27 ENCOUNTER — Telehealth (HOSPITAL_COMMUNITY): Payer: Self-pay

## 2023-09-27 NOTE — Telephone Encounter (Signed)
Attempted to call pt in regards to Cardiac rehab. LM on VM   Mailed letter

## 2023-09-29 ENCOUNTER — Encounter (HOSPITAL_COMMUNITY): Payer: Self-pay

## 2023-09-29 ENCOUNTER — Telehealth (HOSPITAL_COMMUNITY): Payer: Self-pay

## 2023-09-29 NOTE — Telephone Encounter (Signed)
Pt showed Up to the department to schedule orientation. Patient will come in for orientation on 11/14@930  and will attend the 1230 exercise class.

## 2023-09-30 ENCOUNTER — Encounter (HOSPITAL_COMMUNITY)
Admission: RE | Admit: 2023-09-30 | Discharge: 2023-09-30 | Disposition: A | Discharge status: Home / Self Care | Payer: PPO | Source: Ambulatory Visit | Attending: Internal Medicine | Admitting: Internal Medicine

## 2023-09-30 VITALS — BP 96/58 | HR 80 | Ht 66.0 in | Wt 199.5 lb

## 2023-09-30 DIAGNOSIS — Z955 Presence of coronary angioplasty implant and graft: Secondary | ICD-10-CM | POA: Diagnosis not present

## 2023-09-30 DIAGNOSIS — Z48812 Encounter for surgical aftercare following surgery on the circulatory system: Secondary | ICD-10-CM | POA: Diagnosis not present

## 2023-09-30 NOTE — Progress Notes (Signed)
Cardiac Individual Treatment Plan  Patient Details  Name: Patrick Cooley MRN: [id number] Date of Birth: 1956/02/17 Referring Provider:   Flowsheet Row INTENSIVE CARDIAC REHAB ORIENT from 09/30/2023 in [facility]  Referring Provider Weston Brass, MD       Initial Encounter Date:  Flowsheet Row INTENSIVE CARDIAC REHAB ORIENT from 09/30/2023 in [facility]  Date 09/30/23       Visit Diagnosis: 09/08/23 Status post coronary artery stent placement  Patient's Home Medications on Admission:  Current Outpatient Medications:    alfuzosin (UROXATRAL) 10 MG 24 hr tablet, Take 10 mg by mouth at bedtime., Disp: , Rfl:    aspirin EC 81 MG tablet, Take 81 mg by mouth at bedtime. Swallow whole., Disp: , Rfl:    atorvastatin (LIPITOR) 80 MG tablet, Take 1 tablet (80 mg total) by mouth every morning., Disp: 90 tablet, Rfl: 1   cetirizine (ZYRTEC) 10 MG tablet, Take 10 mg by mouth every morning., Disp: , Rfl:    clopidogrel (PLAVIX) 75 MG tablet, Take 1 tablet (75 mg total) by mouth daily with breakfast., Disp: 90 tablet, Rfl: 1   cyanocobalamin (VITAMIN B12) 1000 MCG tablet, Take 1,000 mcg by mouth at bedtime., Disp: , Rfl:    dapagliflozin propanediol (FARXIGA) 10 MG TABS tablet, Take 10 mg by mouth every morning., Disp: , Rfl:    ferrous sulfate 325 (65 FE) MG tablet, Take 325 mg by mouth at bedtime., Disp: , Rfl:    fluticasone (FLONASE) 50 MCG/ACT nasal spray, Place 2 sprays into both nostrils 2 (two) times daily., Disp: , Rfl:    ipratropium (ATROVENT) 0.03 % nasal spray, Place 2 sprays into both nostrils 4 (four) times daily., Disp: , Rfl:    losartan (COZAAR) 25 MG tablet, Take 1 tablet (25 mg total) by mouth daily., Disp: 90 tablet, Rfl: 3   metFORMIN (GLUCOPHAGE) 1000 MG tablet, Take 1,000 mg by mouth 2 (two) times daily with a meal., Disp: , Rfl:    montelukast  (SINGULAIR) 10 MG tablet, Take 10 mg by mouth at bedtime., Disp: , Rfl:    Multiple Vitamins-Minerals (MULTIVITAMIN WITH MINERALS) tablet, Take 1 tablet by mouth every morning., Disp: , Rfl:    naphazoline-glycerin (CLEAR EYES REDNESS) 0.012-0.25 % SOLN, Place 1-2 drops into both eyes 4 (four) times daily as needed for eye irritation., Disp: , Rfl:    nitroGLYCERIN (NITROSTAT) 0.4 MG SL tablet, Place 1 tablet (0.4 mg total) under the tongue every 5 (five) minutes as needed for chest pain., Disp: 25 tablet, Rfl: 3   pantoprazole (PROTONIX) 40 MG tablet, Take 1 tablet (40 mg total) by mouth daily., Disp: 30 tablet, Rfl: 1   polycarbophil (FIBERCON) 625 MG tablet, Take 1,250 mg by mouth 2 (two) times daily., Disp: , Rfl:    sertraline (ZOLOFT) 50 MG tablet, Take 50 mg by mouth every morning., Disp: , Rfl:    sucralfate (CARAFATE) 1 g tablet, Take 1 g by mouth daily as needed (Stomach acid)., Disp: , Rfl:    tadalafil (CIALIS) 5 MG tablet, Take 5 mg by mouth at bedtime., Disp: , Rfl:    tirzepatide (MOUNJARO) 10 MG/0.5ML Pen, Inject 10 mg into the skin once a week., Disp: , Rfl:   Past Medical History: Past Medical History:  Diagnosis Date   Acute torn meniscus of knee, right, sequela    loose bodies also   Allergic  rhinitis    Anxiety    Benign localized prostatic hyperplasia with lower urinary tract symptoms (LUTS)    urologist--- dr Elvera Lennox. [facility]   Bladder outlet obstruction    DM type 2 (diabetes mellitus, type 2) (HCC)    followed by pcp   (08-07-2021 per pt checks blood dialy in am,  fasting sugar-- 114--125)   GERD (gastroesophageal reflux disease)    History of COVID-19 05/2021   per pt positive home test , mild to moderate symptoms,  all symptoms resolved with exception occasional dry cough but could be from allergies   Hyperlipemia    IDA (iron deficiency anemia)    on iron po   OA (osteoarthritis)    OSA on CPAP    PER PT USES NIGHTLY   Tightness in chest    occ with exertion  riding mountain bike;  pt referred to cardiology for evaluation ,  dr g. Jacques Navy , lov note in epic 07-15-2021 (nuclear stress test 06-02-2021 (epic) showed low risk with no ischemia, nuclear ef 50%, hypertensive response with exercise) and echo in care everywhere 07-05-2020 ef 55-60% mild MR   Wears glasses     Tobacco Use: Social History   Tobacco Use  Smoking Status Never  Smokeless Tobacco Never    Labs: Review Flowsheet       06/02/2023  Labs for ITP Cardiac and Pulmonary Rehab  Hemoglobin A1c 6.7        Details       This result is from an external source.         Capillary Blood Glucose: Lab Results  Component Value Date   GLUCAP 188 (H) 09/08/2023   GLUCAP 141 (H) 09/08/2023   GLUCAP 108 (H) 08/12/2021   GLUCAP 109 (H) 08/12/2021     Exercise Target Goals: Exercise Program Goal: Individual exercise prescription set using results from initial 6 min walk test and THRR while considering  patient's activity barriers and safety.   Exercise Prescription Goal: Initial exercise prescription builds to 30-45 minutes a day of aerobic activity, 2-3 days per week.  Home exercise guidelines will be given to patient during program as part of exercise prescription that the participant will acknowledge.  Activity Barriers & Risk Stratification:  Activity Barriers & Cardiac Risk Stratification - 09/30/23 1009       Activity Barriers & Cardiac Risk Stratification   Activity Barriers Arthritis;Joint Problems    Cardiac Risk Stratification High   <5 METs on            6 Minute Walk:  6 Minute Walk     Row Name 09/30/23 1106         6 Minute Walk   Phase Initial     Distance 2040 feet     Walk Time 6 minutes     # of Rest Breaks 0     MPH 3.86     METS 4.38     RPE 9     Perceived Dyspnea  0     VO2 Peak 15.31     Symptoms No     Resting HR 80 bpm     Resting BP 96/58  recheck 112/60     Resting Oxygen Saturation  94 %     Exercise Oxygen  Saturation  during 6 min walk 97 %     Max Ex. HR 121 bpm     Max Ex. BP 130/66     2 Minute Post BP 108/64  Oxygen Initial Assessment:   Oxygen Re-Evaluation:   Oxygen Discharge (Final Oxygen Re-Evaluation):   Initial Exercise Prescription:  Initial Exercise Prescription - 09/30/23 1100       Date of Initial Exercise RX and Referring Provider   Date 09/30/23    Referring Provider Weston Brass, MD    Expected Discharge Date 12/22/23      Bike   Level 1    Watts 50    Minutes 15    METs 3      Recumbant Elliptical   Level 1    RPM 75    Watts 90    Minutes 15    METs 3      Prescription Details   Frequency (times per week) 3    Duration Progress to 30 minutes of continuous aerobic without signs/symptoms of physical distress      Intensity   THRR 40-80% of Max Heartrate 62-124    Ratings of Perceived Exertion 11-13    Perceived Dyspnea 0-4      Progression   Progression Continue progressive overload as per policy without signs/symptoms or physical distress.      Resistance Training   Training Prescription Yes    Weight 3    Reps 10-15             Perform Capillary Blood Glucose checks as needed.  Exercise Prescription Changes:   Exercise Comments:   Exercise Goals and Review:   Exercise Goals     Row Name 09/30/23 0942             Exercise Goals   Increase Physical Activity Yes       Intervention Provide advice, education, support and counseling about physical activity/exercise needs.;Develop an individualized exercise prescription for aerobic and resistive training based on initial evaluation findings, risk stratification, comorbidities and participant's personal goals.       Expected Outcomes Short Term: Attend rehab on a regular basis to increase amount of physical activity.;Long Term: Exercising regularly at least 3-5 days a week.;Long Term: Add in home exercise to make exercise part of routine and to increase  amount of physical activity.       Increase Strength and Stamina Yes       Intervention Provide advice, education, support and counseling about physical activity/exercise needs.;Develop an individualized exercise prescription for aerobic and resistive training based on initial evaluation findings, risk stratification, comorbidities and participant's personal goals.       Expected Outcomes Short Term: Increase workloads from initial exercise prescription for resistance, speed, and METs.;Short Term: Perform resistance training exercises routinely during rehab and add in resistance training at home;Long Term: Improve cardiorespiratory fitness, muscular endurance and strength as measured by increased METs and functional capacity ( )       Able to understand and use rate of perceived exertion (RPE) scale Yes       Intervention Provide education and explanation on how to use RPE scale       Expected Outcomes Short Term: Able to use RPE daily in rehab to express subjective intensity level;Long Term:  Able to use RPE to guide intensity level when exercising independently       Knowledge and understanding of Target Heart Rate Range (THRR) Yes       Intervention Provide education and explanation of THRR including how the numbers were predicted and where they are located for reference       Expected Outcomes Short Term: Able to state/look up THRR;Long Term: Able to  use THRR to govern intensity when exercising independently;Short Term: Able to use daily as guideline for intensity in rehab       Understanding of Exercise Prescription Yes       Intervention Provide education, explanation, and written materials on patient's individual exercise prescription       Expected Outcomes Short Term: Able to explain program exercise prescription;Long Term: Able to explain home exercise prescription to exercise independently                Exercise Goals Re-Evaluation :   Discharge Exercise Prescription (Final  Exercise Prescription Changes):   Nutrition:  Target Goals: Understanding of nutrition guidelines, daily intake of sodium 1500mg , cholesterol 200mg , calories 30% from fat and 7% or less from saturated fats, daily to have 5 or more servings of fruits and vegetables.  Biometrics:  Pre Biometrics - 09/30/23 0939       Pre Biometrics   Waist Circumference 45 inches    Hip Circumference 435.5 inches    Waist to Hip Ratio 0.1 %    Triceps Skinfold 9 mm    % Body Fat 29.7 %    Grip Strength 38 kg    Flexibility 11 in    Single Leg Stand 30 seconds              Nutrition Therapy Plan and Nutrition Goals:   Nutrition Assessments:  MEDIFICTS Score Key: >=70 Need to make dietary changes  40-70 Heart Healthy Diet <= 40 Therapeutic Level Cholesterol Diet    Picture Your Plate Scores: <16 Unhealthy dietary pattern with much room for improvement. 41-50 Dietary pattern unlikely to meet recommendations for good health and room for improvement. 51-60 More healthful dietary pattern, with some room for improvement.  >60 Healthy dietary pattern, although there may be some specific behaviors that could be improved.    Nutrition Goals Re-Evaluation:   Nutrition Goals Re-Evaluation:   Nutrition Goals Discharge (Final Nutrition Goals Re-Evaluation):   Psychosocial: Target Goals: Acknowledge presence or absence of significant depression and/or stress, maximize coping skills, provide positive support system. Participant is able to verbalize types and ability to use techniques and skills needed for reducing stress and depression.  Initial Review & Psychosocial Screening:  Initial Psych Review & Screening - 09/30/23 1009       Initial Review   Current issues with None Identified;Current Psychotropic Meds      Family Dynamics   Good Support System? Yes   Joshuel has his wife for support   Comments Tyrion denies any current feelings of anxiety/depression/stress. He is taking Zoloft  and feels it is working well for him.      Barriers   Psychosocial barriers to participate in program There are no identifiable barriers or psychosocial needs.      Screening Interventions   Interventions Encouraged to exercise;Provide feedback about the scores to participant    Expected Outcomes Short Term goal: Identification and review with participant of any Quality of Life or Depression concerns found by scoring the questionnaire.;Long Term goal: The participant improves quality of Life and PHQ9 Scores as seen by post scores and/or verbalization of changes             Quality of Life Scores:  Quality of Life - 09/30/23 1022       Quality of Life   Select Quality of Life      Quality of Life Scores   Health/Function Pre 28.23 %    Socioeconomic Pre 28.57 %  Psych/Spiritual Pre 30 %    Family Pre 30 %    GLOBAL Pre 28.93 %            Scores of 19 and below usually indicate a poorer quality of life in these areas.  A difference of  2-3 points is a clinically meaningful difference.  A difference of 2-3 points in the total score of the Quality of Life Index has been associated with significant improvement in overall quality of life, self-image, physical symptoms, and general health in studies assessing change in quality of life.  PHQ-9: Review Flowsheet       09/30/2023  Depression screen PHQ 2/9  Decreased Interest 0  Down, Depressed, Hopeless 0  PHQ - 2 Score 0  Altered sleeping 1  Tired, decreased energy 1  Change in appetite 0  Feeling bad or failure about yourself  0  Trouble concentrating 0  Moving slowly or fidgety/restless 0  Suicidal thoughts 0  PHQ-9 Score 2  Difficult doing work/chores Not difficult at all    Details           Interpretation of Total Score  Total Score Depression Severity:  1-4 = Minimal depression, 5-9 = Mild depression, 10-14 = Moderate depression, 15-19 = Moderately severe depression, 20-27 = Severe depression    Psychosocial Evaluation and Intervention:   Psychosocial Re-Evaluation:   Psychosocial Discharge (Final Psychosocial Re-Evaluation):   Vocational Rehabilitation: Provide vocational rehab assistance to qualifying candidates.   Vocational Rehab Evaluation & Intervention:  Vocational Rehab - 09/30/23 1011       Initial Vocational Rehab Evaluation & Intervention   Assessment shows need for Vocational Rehabilitation No   Cari is retired            Education: Education Goals: Education classes will be provided on a weekly basis, covering required topics. Participant will state understanding/return demonstration of topics presented.     Core Videos: Exercise    Move It!  Clinical staff conducted group or individual video education with verbal and written material and guidebook.  Patient learns the recommended Pritikin exercise program. Exercise with the goal of living a long, healthy life. Some of the health benefits of exercise include controlled diabetes, healthier blood pressure levels, improved cholesterol levels, improved heart and lung capacity, improved sleep, and better body composition. Everyone should speak with their doctor before starting or changing an exercise routine.  Biomechanical Limitations Clinical staff conducted group or individual video education with verbal and written material and guidebook.  Patient learns how biomechanical limitations can impact exercise and how we can mitigate and possibly overcome limitations to have an impactful and balanced exercise routine.  Body Composition Clinical staff conducted group or individual video education with verbal and written material and guidebook.  Patient learns that body composition (ratio of muscle mass to fat mass) is a key component to assessing overall fitness, rather than body weight alone. Increased fat mass, especially visceral belly fat, can put [place] at increased risk for metabolic syndrome, type 2  diabetes, heart disease, and even death. It is recommended to combine diet and exercise (cardiovascular and resistance training) to improve your body composition. Seek guidance from your physician and exercise physiologist before implementing an exercise routine.  Exercise Action Plan Clinical staff conducted group or individual video education with verbal and written material and guidebook.  Patient learns the recommended strategies to achieve and enjoy long-term exercise adherence, including variety, self-motivation, self-efficacy, and positive decision making. Benefits of exercise include fitness, good  health, weight management, more energy, better sleep, less stress, and overall well-being.  Medical   Heart Disease Risk Reduction Clinical staff conducted group or individual video education with verbal and written material and guidebook.  Patient learns our heart is our most vital organ as it circulates oxygen, nutrients, white blood cells, and hormones throughout the entire body, and carries waste away. Data supports a plant-based eating plan like the Pritikin Program for its effectiveness in slowing progression of and reversing heart disease. The video provides a number of recommendations to address heart disease.   Metabolic Syndrome and Belly Fat  Clinical staff conducted group or individual video education with verbal and written material and guidebook.  Patient learns what metabolic syndrome is, how it leads to heart disease, and how one can reverse it and keep it from coming back. You have metabolic syndrome if you have 3 of the following 5 criteria: abdominal obesity, high blood pressure, high triglycerides, low HDL cholesterol, and high blood sugar.  Hypertension and Heart Disease Clinical staff conducted group or individual video education with verbal and written material and guidebook.  Patient learns that high blood pressure, or hypertension, is very common in the [place].  Hypertension is largely due to excessive salt intake, but other important risk factors include being overweight, physical inactivity, drinking too much alcohol, smoking, and not eating enough potassium from fruits and vegetables. High blood pressure is a leading risk factor for heart attack, stroke, congestive heart failure, dementia, kidney failure, and premature death. Long-term effects of excessive salt intake include stiffening of the arteries and thickening of heart muscle and organ damage. Recommendations include ways to reduce hypertension and the risk of heart disease.  Diseases of Our Time - Focusing on Diabetes Clinical staff conducted group or individual video education with verbal and written material and guidebook.  Patient learns why the best way to stop diseases of our time is prevention, through food and other lifestyle changes. Medicine (such as prescription pills and surgeries) is often only a Band-Aid on the problem, not a long-term solution. Most common diseases of our time include obesity, type 2 diabetes, hypertension, heart disease, and cancer. The Pritikin Program is recommended and has been proven to help reduce, reverse, and/or prevent the damaging effects of metabolic syndrome.  Nutrition   Overview of the Pritikin Eating Plan  Clinical staff conducted group or individual video education with verbal and written material and guidebook.  Patient learns about the Pritikin Eating Plan for disease risk reduction. The Pritikin Eating Plan emphasizes a wide variety of unrefined, minimally-processed carbohydrates, like fruits, vegetables, whole grains, and legumes. Go, Caution, and Stop food choices are explained. Plant-based and lean animal proteins are emphasized. Rationale provided for low sodium intake for blood pressure control, low added sugars for blood sugar stabilization, and low added fats and oils for coronary artery disease risk reduction and weight management.  Calorie  Density  Clinical staff conducted group or individual video education with verbal and written material and guidebook.  Patient learns about calorie density and how it impacts the Pritikin Eating Plan. Knowing the characteristics of the food you choose will help you decide whether those foods will lead to weight gain or weight loss, and whether you want to consume more or less of them. Weight loss is usually a side effect of the Pritikin Eating Plan because of its focus on low calorie-dense foods.  Label Reading  Clinical staff conducted group or individual video education with verbal and written  material and guidebook.  Patient learns about the Pritikin recommended label reading guidelines and corresponding recommendations regarding calorie density, added sugars, sodium content, and whole grains.  Dining Out - Part 1  Clinical staff conducted group or individual video education with verbal and written material and guidebook.  Patient learns that restaurant meals can be sabotaging because they can be so high in calories, fat, sodium, and/or sugar. Patient learns recommended strategies on how to positively address this and avoid unhealthy pitfalls.  Facts on Fats  Clinical staff conducted group or individual video education with verbal and written material and guidebook.  Patient learns that lifestyle modifications can be just as effective, if not more so, as many medications for lowering your risk of heart disease. A Pritikin lifestyle can help to reduce your risk of inflammation and atherosclerosis (cholesterol build-up, or plaque, in the artery walls). Lifestyle interventions such as dietary choices and physical activity address the cause of atherosclerosis. A review of the types of fats and their impact on blood cholesterol levels, along with dietary recommendations to reduce fat intake is also included.  Nutrition Action Plan  Clinical staff conducted group or individual video education with  verbal and written material and guidebook.  Patient learns how to incorporate Pritikin recommendations into their lifestyle. Recommendations include planning and keeping personal health goals in mind as an important part of their success.  Healthy Mind-Set    Healthy Minds, Bodies, Hearts  Clinical staff conducted group or individual video education with verbal and written material and guidebook.  Patient learns how to identify when they are stressed. Video will discuss the impact of that stress, as well as the many benefits of stress management. Patient will also be introduced to stress management techniques. The way we think, act, and feel has an impact on our hearts.  How Our Thoughts Can Heal Our Hearts  Clinical staff conducted group or individual video education with verbal and written material and guidebook.  Patient learns that negative thoughts can cause depression and anxiety. This can result in negative lifestyle behavior and serious health problems. Cognitive behavioral therapy is an effective method to help control our thoughts in order to change and improve our emotional outlook.  Additional Videos:  Exercise    Improving Performance  Clinical staff conducted group or individual video education with verbal and written material and guidebook.  Patient learns to use a non-linear approach by alternating intensity levels and lengths of time spent exercising to help burn more calories and lose more body fat. Cardiovascular exercise helps improve heart health, metabolism, hormonal balance, blood sugar control, and recovery from fatigue. Resistance training improves strength, endurance, balance, coordination, reaction time, metabolism, and muscle mass. Flexibility exercise improves circulation, posture, and balance. Seek guidance from your physician and exercise physiologist before implementing an exercise routine and learn your capabilities and proper form for all exercise.  Introduction  to Yoga  Clinical staff conducted group or individual video education with verbal and written material and guidebook.  Patient learns about yoga, a discipline of the coming together of mind, breath, and body. The benefits of yoga include improved flexibility, improved range of motion, better posture and core strength, increased lung function, weight loss, and positive self-image. Yoga's heart health benefits include lowered blood pressure, healthier heart rate, decreased cholesterol and triglyceride levels, improved immune function, and reduced stress. Seek guidance from your physician and exercise physiologist before implementing an exercise routine and learn your capabilities and proper form for all exercise.  Medical  Aging: Manufacturing systems engineer of [employer]  Clinical staff conducted group or individual video education with verbal and written material and guidebook.  Patient learns key strategies and recommendations to stay in good physical health and enhance quality of life, such as prevention strategies, having an advocate, securing a Health Care Proxy and Power of Attorney, and keeping a list of medications and system for tracking them. It also discusses how to avoid risk for bone loss.  Biology of Weight Control  Clinical staff conducted group or individual video education with verbal and written material and guidebook.  Patient learns that weight gain occurs because we consume more calories than we burn (eating more, moving less). Even if your body weight is normal, you may have higher ratios of fat compared to muscle mass. Too much body fat puts you at increased risk for cardiovascular disease, heart attack, stroke, type 2 diabetes, and obesity-related cancers. In addition to exercise, following the Pritikin Eating Plan can help reduce your risk.  Decoding Lab Results  Clinical staff conducted group or individual video education with verbal and written material and guidebook.  Patient learns  that lab test reflects one measurement whose values change over time and are influenced by many factors, including medication, stress, sleep, exercise, food, hydration, pre-existing medical conditions, and more. It is recommended to use the knowledge from this video to become more involved with your lab results and evaluate your numbers to speak with your doctor.   Diseases of Our Time - Overview  Clinical staff conducted group or individual video education with verbal and written material and guidebook.  Patient learns that according to the CDC, 50% to 70% of chronic diseases (such as obesity, type 2 diabetes, elevated lipids, hypertension, and heart disease) are avoidable through lifestyle improvements including healthier food choices, listening to satiety cues, and increased physical activity.  Sleep Disorders Clinical staff conducted group or individual video education with verbal and written material and guidebook.  Patient learns how good quality and duration of sleep are important to overall health and well-being. Patient also learns about sleep disorders and how they impact health along with recommendations to address them, including discussing with a physician.  Nutrition  Dining Out - Part 2 Clinical staff conducted group or individual video education with verbal and written material and guidebook.  Patient learns how to plan ahead and communicate in order to maximize their dining experience in a healthy and nutritious manner. Included are recommended food choices based on the type of restaurant the patient is visiting.   Fueling a Banker conducted group or individual video education with verbal and written material and guidebook.  There is a strong connection between our food choices and our health. Diseases like obesity and type 2 diabetes are very prevalent and are in large-part due to lifestyle choices. The Pritikin Eating Plan provides plenty of food and  hunger-curbing satisfaction. It is easy to follow, affordable, and helps reduce health risks.  Menu Workshop  Clinical staff conducted group or individual video education with verbal and written material and guidebook.  Patient learns that restaurant meals can sabotage health goals because they are often packed with calories, fat, sodium, and sugar. Recommendations include strategies to plan ahead and to communicate with the manager, chef, or server to help order a healthier meal.  Planning Your Eating Strategy  Clinical staff conducted group or individual video education with verbal and written material and guidebook.  Patient learns about the Pritikin Eating Plan  and its benefit of reducing the risk of disease. The Pritikin Eating Plan does not focus on calories. Instead, it emphasizes high-quality, nutrient-rich foods. By knowing the characteristics of the foods, we choose, we can determine their calorie density and make informed decisions.  Targeting Your Nutrition Priorities  Clinical staff conducted group or individual video education with verbal and written material and guidebook.  Patient learns that lifestyle habits have a tremendous impact on disease risk and progression. This video provides eating and physical activity recommendations based on your personal health goals, such as reducing LDL cholesterol, losing weight, preventing or controlling type 2 diabetes, and reducing high blood pressure.  Vitamins and Minerals  Clinical staff conducted group or individual video education with verbal and written material and guidebook.  Patient learns different ways to obtain key vitamins and minerals, including through a recommended healthy diet. It is important to discuss all supplements you take with your doctor.   Healthy Mind-Set    Smoking Cessation  Clinical staff conducted group or individual video education with verbal and written material and guidebook.  Patient learns that cigarette  smoking and tobacco addiction pose a serious health risk which affects millions of people. Stopping smoking will significantly reduce the risk of heart disease, lung disease, and many forms of cancer. Recommended strategies for quitting are covered, including working with your doctor to develop a successful plan.  Culinary   Becoming a Set designer conducted group or individual video education with verbal and written material and guidebook.  Patient learns that cooking at home can be healthy, cost-effective, quick, and puts them in control. Keys to cooking healthy recipes will include looking at your recipe, assessing your equipment needs, planning ahead, making it simple, choosing cost-effective seasonal ingredients, and limiting the use of added fats, salts, and sugars.  Cooking - Breakfast and Snacks  Clinical staff conducted group or individual video education with verbal and written material and guidebook.  Patient learns how important breakfast is to satiety and nutrition through the entire day. Recommendations include key foods to eat during breakfast to help stabilize blood sugar levels and to prevent overeating at meals later in the day. Planning ahead is also a key component.  Cooking - Educational psychologist conducted group or individual video education with verbal and written material and guidebook.  Patient learns eating strategies to improve overall health, including an approach to cook more at home. Recommendations include thinking of animal protein as a side on your plate rather than center stage and focusing instead on lower calorie dense options like vegetables, fruits, whole grains, and plant-based proteins, such as beans. Making sauces in large quantities to freeze for later and leaving the skin on your vegetables are also recommended to maximize your experience.  Cooking - Healthy Salads and Dressing Clinical staff conducted group or individual video  education with verbal and written material and guidebook.  Patient learns that vegetables, fruits, whole grains, and legumes are the foundations of the Pritikin Eating Plan. Recommendations include how to incorporate each of these in flavorful and healthy salads, and how to create homemade salad dressings. Proper handling of ingredients is also covered. Cooking - Soups and [employer] - Soups and Desserts Clinical staff conducted group or individual video education with verbal and written material and guidebook.  Patient learns that Pritikin soups and desserts make for easy, nutritious, and delicious snacks and meal components that are low in sodium, fat, sugar, and calorie density,  while high in vitamins, minerals, and filling fiber. Recommendations include simple and healthy ideas for soups and desserts.   Overview     The Pritikin Solution Program Overview Clinical staff conducted group or individual video education with verbal and written material and guidebook.  Patient learns that the results of the Pritikin Program have been documented in more than 100 articles published in peer-reviewed journals, and the benefits include reducing risk factors for (and, in some cases, even reversing) high cholesterol, high blood pressure, type 2 diabetes, obesity, and more! An overview of the three key pillars of the Pritikin Program will be covered: eating well, doing regular exercise, and having a healthy mind-set.  WORKSHOPS  Exercise: Exercise Basics: Building Your Action Plan Clinical staff led group instruction and group discussion with PowerPoint presentation and patient guidebook. To enhance the learning environment the use of posters, models and videos may be added. At the conclusion of this workshop, patients will comprehend the difference between physical activity and exercise, as well as the benefits of incorporating both, into their routine. Patients will understand the FITT (Frequency,  Intensity, Time, and Type) principle and how to use it to build an exercise action plan. In addition, safety concerns and other considerations for exercise and cardiac rehab will be addressed by the presenter. The purpose of this lesson is to promote a comprehensive and effective weekly exercise routine in order to improve patients' overall level of fitness.   Managing Heart Disease: Your Path to a Healthier Heart Clinical staff led group instruction and group discussion with PowerPoint presentation and patient guidebook. To enhance the learning environment the use of posters, models and videos may be added.At the conclusion of this workshop, patients will understand the anatomy and physiology of the heart. Additionally, they will understand how Pritikin's three pillars impact the risk factors, the progression, and the management of heart disease.  The purpose of this lesson is to provide a high-level overview of the heart, heart disease, and how the Pritikin lifestyle positively impacts risk factors.  Exercise Biomechanics Clinical staff led group instruction and group discussion with PowerPoint presentation and patient guidebook. To enhance the learning environment the use of posters, models and videos may be added. Patients will learn how the structural parts of their bodies function and how these functions impact their daily activities, movement, and exercise. Patients will learn how to promote a neutral spine, learn how to manage pain, and identify ways to improve their physical movement in order to promote healthy living. The purpose of this lesson is to expose patients to common physical limitations that impact physical activity. Participants will learn practical ways to adapt and manage aches and pains, and to minimize their effect on regular exercise. Patients will learn how to maintain good posture while sitting, walking, and lifting.  Balance Training and Fall Prevention  Clinical  staff led group instruction and group discussion with PowerPoint presentation and patient guidebook. To enhance the learning environment the use of posters, models and videos may be added. At the conclusion of this workshop, patients will understand the importance of their sensorimotor skills (vision, proprioception, and the vestibular system) in maintaining their ability to balance as they age. Patients will apply a variety of balancing exercises that are appropriate for their current level of function. Patients will understand the common causes for poor balance, possible solutions to these problems, and ways to modify their physical environment in order to minimize their fall risk. The purpose of this lesson is to teach  patients about the importance of maintaining balance as they age and ways to minimize their risk of falling.  WORKSHOPS   Nutrition:  Fueling a Ship broker led group instruction and group discussion with PowerPoint presentation and patient guidebook. To enhance the learning environment the use of posters, models and videos may be added. Patients will review the foundational principles of the Pritikin Eating Plan and understand what constitutes a serving size in each of the food groups. Patients will also learn Pritikin-friendly foods that are better choices when away from home and review make-ahead meal and snack options. Calorie density will be reviewed and applied to three nutrition priorities: weight maintenance, weight loss, and weight gain. The purpose of this lesson is to reinforce (in a group setting) the key concepts around what patients are recommended to eat and how to apply these guidelines when away from home by planning and selecting Pritikin-friendly options. Patients will understand how calorie density may be adjusted for different weight management goals.  Mindful Eating  Clinical staff led group instruction and group discussion with PowerPoint  presentation and patient guidebook. To enhance the learning environment the use of posters, models and videos may be added. Patients will briefly review the concepts of the Pritikin Eating Plan and the importance of low-calorie dense foods. The concept of mindful eating will be introduced as well as the importance of paying attention to internal hunger signals. Triggers for non-hunger eating and techniques for dealing with triggers will be explored. The purpose of this lesson is to provide patients with the opportunity to review the basic principles of the Pritikin Eating Plan, discuss the value of eating mindfully and how to measure internal cues of hunger and fullness using the Hunger Scale. Patients will also discuss reasons for non-hunger eating and learn strategies to use for controlling emotional eating.  Targeting Your Nutrition Priorities Clinical staff led group instruction and group discussion with PowerPoint presentation and patient guidebook. To enhance the learning environment the use of posters, models and videos may be added. Patients will learn how to determine their genetic susceptibility to disease by reviewing their family history. Patients will gain insight into the importance of diet as part of an overall healthy lifestyle in mitigating the impact of genetics and other environmental insults. The purpose of this lesson is to provide patients with the opportunity to assess their personal nutrition priorities by looking at their family history, their own health history and current risk factors. Patients will also be able to discuss ways of prioritizing and modifying the Pritikin Eating Plan for their highest risk areas  Menu  Clinical staff led group instruction and group discussion with PowerPoint presentation and patient guidebook. To enhance the learning environment the use of posters, models and videos may be added. Using menus brought in from [employer], or printed from [employer], patients will apply the Pritikin dining out guidelines that were presented in the [employer] video. Patients will also be able to practice these guidelines in a variety of provided scenarios. The purpose of this lesson is to provide patients with the opportunity to practice hands-on learning of the Pritikin Dining Out guidelines with actual menus and practice scenarios.  Label Reading Clinical staff led group instruction and group discussion with PowerPoint presentation and patient guidebook. To enhance the learning environment the use of posters, models and videos may be added. Patients will review and discuss the Pritikin label reading guidelines presented in Pritikin's Label Reading Educational series  video. Using fool labels brought in from local grocery stores and markets, patients will apply the label reading guidelines and determine if the packaged food meet the Pritikin guidelines. The purpose of this lesson is to provide patients with the opportunity to review, discuss, and practice hands-on learning of the Pritikin Label Reading guidelines with actual packaged food labels. Cooking School  Pritikin's [employer] are designed to teach patients ways to prepare quick, simple, and affordable recipes at home. The importance of nutrition's role in chronic disease risk reduction is reflected in its emphasis in the overall Pritikin program. By learning how to prepare essential core Pritikin Eating Plan recipes, patients will increase control over what they eat; be able to customize the flavor of foods without the use of added salt, sugar, or fat; and improve the quality of the food they consume. By learning a set of core recipes which are easily assembled, quickly prepared, and affordable, patients are more likely to prepare more healthy foods at home. These workshops focus on convenient breakfasts, simple entres, side dishes, and desserts which can be prepared with  minimal effort and are consistent with nutrition recommendations for cardiovascular risk reduction. Cooking [employer] are taught by a Armed forces logistics/support/administrative officer (RD) who has been trained by the [employer]. The chef or RD has a clear understanding of the importance of minimizing - if not completely eliminating - added fat, sugar, and sodium in recipes. Throughout the series of Cooking School Workshop sessions, patients will learn about healthy ingredients and efficient methods of cooking to build confidence in their capability to prepare    Cooking School weekly topics:  Adding Flavor- Sodium-Free  Fast and Healthy Breakfasts  Powerhouse Plant-Based Proteins  Satisfying Salads and Dressings  Simple Sides and Sauces  International Cuisine-Spotlight on the [employer] Zones  Delicious Desserts  Savory Soups  [employer] - Meals in a Astronomer Appetizers and Snacks  Comforting Weekend Breakfasts  One-Pot Wonders   Fast Evening Meals  Landscape architect Your Pritikin Plate  WORKSHOPS   Healthy Mindset (Psychosocial):  Focused Goals, Sustainable Changes Clinical staff led group instruction and group discussion with PowerPoint presentation and patient guidebook. To enhance the learning environment the use of posters, models and videos may be added. Patients will be able to apply effective goal setting strategies to establish at least one personal goal, and then take consistent, meaningful action toward that goal. They will learn to identify common barriers to achieving personal goals and develop strategies to overcome them. Patients will also gain an understanding of how our mind-set can impact our ability to achieve goals and the importance of cultivating a positive and growth-oriented mind-set. The purpose of this lesson is to provide patients with a deeper understanding of how to set and achieve personal goals, as well as the tools and strategies needed  to overcome common obstacles which may arise along the way.  From Head to Heart: The Power of a Healthy Outlook  Clinical staff led group instruction and group discussion with PowerPoint presentation and patient guidebook. To enhance the learning environment the use of posters, models and videos may be added. Patients will be able to recognize and describe the impact of emotions and mood on physical health. They will discover the importance of self-care and explore self-care practices which may work for them. Patients will also learn how to utilize the 4 C's to cultivate a healthier outlook and better manage stress and challenges. The  purpose of this lesson is to demonstrate to patients how a healthy outlook is an essential part of maintaining good health, especially as they continue their cardiac rehab journey.  Healthy Sleep for a Healthy Heart Clinical staff led group instruction and group discussion with PowerPoint presentation and patient guidebook. To enhance the learning environment the use of posters, models and videos may be added. At the conclusion of this workshop, patients will be able to demonstrate knowledge of the importance of sleep to overall health, well-being, and quality of life. They will understand the symptoms of, and treatments for, common sleep disorders. Patients will also be able to identify daytime and nighttime behaviors which impact sleep, and they will be able to apply these tools to help manage sleep-related challenges. The purpose of this lesson is to provide patients with a general overview of sleep and outline the importance of quality sleep. Patients will learn about a few of the most common sleep disorders. Patients will also be introduced to the concept of "sleep hygiene," and discover ways to self-manage certain sleeping problems through simple daily behavior changes. Finally, the workshop will motivate patients by clarifying the links between quality sleep and their  goals of heart-healthy living.   Recognizing and Reducing Stress Clinical staff led group instruction and group discussion with PowerPoint presentation and patient guidebook. To enhance the learning environment the use of posters, models and videos may be added. At the conclusion of this workshop, patients will be able to understand the types of stress reactions, differentiate between acute and chronic stress, and recognize the impact that chronic stress has on their health. They will also be able to apply different coping mechanisms, such as reframing negative self-talk. Patients will have the opportunity to practice a variety of stress management techniques, such as deep abdominal breathing, progressive muscle relaxation, and/or guided imagery.  The purpose of this lesson is to educate patients on the role of stress in their lives and to provide healthy techniques for coping with it.  Learning Barriers/Preferences:  Learning Barriers/Preferences - 09/30/23 1010       Learning Barriers/Preferences   Learning Barriers Sight   wears glasses   Learning Preferences Audio;Computer/Internet;Group Instruction;Individual Instruction;Pictoral;Skilled Demonstration;Verbal Instruction;Video;Written Material             Education Topics:  Knowledge Questionnaire Score:  Knowledge Questionnaire Score - 09/30/23 1011       Knowledge Questionnaire Score   Pre Score 21/24             Core Components/Risk Factors/Patient Goals at Admission:  Personal Goals and Risk Factors at Admission - 09/30/23 1011       Core Components/Risk Factors/Patient Goals on Admission    Weight Management Yes;Obesity;Weight Loss    Intervention Weight Management: Develop a combined nutrition and exercise program designed to reach desired caloric intake, while maintaining appropriate intake of nutrient and fiber, sodium and fats, and appropriate energy expenditure required for the weight goal.;Weight Management:  Provide education and appropriate resources to help participant work on and attain dietary goals.;Weight Management/Obesity: Establish reasonable short term and long term weight goals.;Obesity: Provide education and appropriate resources to help participant work on and attain dietary goals.    Expected Outcomes Short Term: Continue to assess and modify interventions until short term weight is achieved;Long Term: Adherence to nutrition and physical activity/exercise program aimed toward attainment of established weight goal;Weight Loss: Understanding of general recommendations for a balanced deficit meal plan, which promotes 1-2 lb weight loss per week and  includes a negative energy balance of [phone] kcal/d;Understanding recommendations for meals to include 15-35% energy as protein, 25-35% energy from fat, 35-60% energy from carbohydrates, less than 200mg  of dietary cholesterol, 20-35 gm of total fiber daily;Understanding of distribution of calorie intake throughout the day with the consumption of 4-5 meals/snacks    Diabetes Yes    Intervention Provide education about signs/symptoms and action to take for hypo/hyperglycemia.;Provide education about proper nutrition, including hydration, and aerobic/resistive exercise prescription along with prescribed medications to achieve blood glucose in normal ranges: Fasting glucose 65-99 mg/dL    Expected Outcomes Short Term: Participant verbalizes understanding of the signs/symptoms and immediate care of hyper/hypoglycemia, proper foot care and importance of medication, aerobic/resistive exercise and nutrition plan for blood glucose control.;Long Term: Attainment of HbA1C < 7%.    Hypertension Yes    Intervention Provide education on lifestyle modifcations including regular physical activity/exercise, weight management, moderate sodium restriction and increased consumption of fresh fruit, vegetables, and low fat dairy, alcohol moderation, and smoking  cessation.;Monitor prescription use compliance.    Expected Outcomes Short Term: Continued assessment and intervention until BP is < 140/88mm HG in hypertensive participants. < 130/62mm HG in hypertensive participants with diabetes, heart failure or chronic kidney disease.;Long Term: Maintenance of blood pressure at goal levels.    Lipids Yes    Intervention Provide education and support for participant on nutrition & aerobic/resistive exercise along with prescribed medications to achieve LDL 70mg , HDL >40mg .    Expected Outcomes Short Term: Participant states understanding of desired cholesterol values and is compliant with medications prescribed. Participant is following exercise prescription and nutrition guidelines.;Long Term: Cholesterol controlled with medications as prescribed, with individualized exercise RX and with personalized nutrition plan. Value goals: LDL < 70mg , HDL > 40 mg.             Core Components/Risk Factors/Patient Goals Review:    Core Components/Risk Factors/Patient Goals at Discharge (Final Review):    ITP Comments:  ITP Comments     Row Name 09/30/23 0940           ITP Comments Armanda Magic, MD: Medical Director.  Introduction to the [facility]/Intensive Cardiac Rehab.  Initial orientation packet reviewed with the patient.                Comments: Participant attended orientation for the cardiac rehabilitation program on  09/30/2023  to perform initial intake and exercise walk test. Patient introduced to the [facility] education and orientation packet was reviewed. Completed 6-minute walk test, measurements, initial ITP, and exercise prescription. Vital signs stable. Telemetry-normal sinus rhythm RBBB, asymptomatic. Pre BP 98/56, given H2O, BP recheck 112/60.   Service time was from 0930 to 1102.  Jonna Coup, MS, ACSM-CEP 09/30/2023 11:11 AM

## 2023-09-30 NOTE — Progress Notes (Signed)
Cardiac Rehab Medication Review   Does the patient  feel that his/her medications are working for him/her?  YES   Has the patient been experiencing any side effects to the medications prescribed?   NO  Does the patient measure his/her own blood pressure or blood glucose at home?  YES   Does the patient have any problems obtaining medications due to transportation or finances?  NO  Understanding of regimen: excellent Understanding of indications: excellent Potential of compliance: excellent    Comments: Patrick Cooley understands his medications and regime well. He checks his CBG's 2-3 times a week at home and has a BP cuff but doesn't use it regularly.     Jonna Coup, MS, ACSM-CEP 09/30/2023 10:15 AM

## 2023-10-06 NOTE — Progress Notes (Signed)
Cardiac Individual Treatment Plan  Patient Details  Name: Patrick Cooley MRN: [id number] Date of Birth: [birth year] Referring Provider:   Flowsheet Row INTENSIVE CARDIAC REHAB ORIENT from 09/30/2023 in [facility]  Referring Provider Weston Brass, MD       Initial Encounter Date:  Flowsheet Row INTENSIVE CARDIAC REHAB ORIENT from 09/30/2023 in [facility]  Date 09/30/23       Visit Diagnosis: 09/08/23 Status post coronary artery stent placement  Patient's Home Medications on Admission:  Current Outpatient Medications:    alfuzosin (UROXATRAL) 10 MG 24 hr tablet, Take 10 mg by mouth at bedtime., Disp: , Rfl:    aspirin EC 81 MG tablet, Take 81 mg by mouth at bedtime. Swallow whole., Disp: , Rfl:    atorvastatin (LIPITOR) 80 MG tablet, Take 1 tablet (80 mg total) by mouth every morning., Disp: 90 tablet, Rfl: 1   cetirizine (ZYRTEC) 10 MG tablet, Take 10 mg by mouth every morning., Disp: , Rfl:    clopidogrel (PLAVIX) 75 MG tablet, Take 1 tablet (75 mg total) by mouth daily with breakfast., Disp: 90 tablet, Rfl: 1   cyanocobalamin (VITAMIN B12) 1000 MCG tablet, Take 1,000 mcg by mouth at bedtime., Disp: , Rfl:    dapagliflozin propanediol (FARXIGA) 10 MG TABS tablet, Take 10 mg by mouth every morning., Disp: , Rfl:    ferrous sulfate 325 (65 FE) MG tablet, Take 325 mg by mouth at bedtime., Disp: , Rfl:    fluticasone (FLONASE) 50 MCG/ACT nasal spray, Place 2 sprays into both nostrils 2 (two) times daily., Disp: , Rfl:    ipratropium (ATROVENT) 0.03 % nasal spray, Place 2 sprays into both nostrils 4 (four) times daily., Disp: , Rfl:    losartan (COZAAR) 25 MG tablet, Take 1 tablet (25 mg total) by mouth daily., Disp: 90 tablet, Rfl: 3   metFORMIN (GLUCOPHAGE) 1000 MG tablet, Take 1,000 mg by mouth 2 (two) times daily with a meal., Disp: , Rfl:    montelukast  (SINGULAIR) 10 MG tablet, Take 10 mg by mouth at bedtime., Disp: , Rfl:    Multiple Vitamins-Minerals (MULTIVITAMIN WITH MINERALS) tablet, Take 1 tablet by mouth every morning., Disp: , Rfl:    naphazoline-glycerin (CLEAR EYES REDNESS) 0.012-0.25 % SOLN, Place 1-2 drops into both eyes 4 (four) times daily as needed for eye irritation., Disp: , Rfl:    nitroGLYCERIN (NITROSTAT) 0.4 MG SL tablet, Place 1 tablet (0.4 mg total) under the tongue every 5 (five) minutes as needed for chest pain., Disp: 25 tablet, Rfl: 3   pantoprazole (PROTONIX) 40 MG tablet, Take 1 tablet (40 mg total) by mouth daily., Disp: 30 tablet, Rfl: 1   polycarbophil (FIBERCON) 625 MG tablet, Take 1,250 mg by mouth 2 (two) times daily., Disp: , Rfl:    sertraline (ZOLOFT) 50 MG tablet, Take 50 mg by mouth every morning., Disp: , Rfl:    sucralfate (CARAFATE) 1 g tablet, Take 1 g by mouth daily as needed (Stomach acid)., Disp: , Rfl:    tadalafil (CIALIS) 5 MG tablet, Take 5 mg by mouth at bedtime., Disp: , Rfl:    tirzepatide (MOUNJARO) 10 MG/0.5ML Pen, Inject 10 mg into the skin once a week., Disp: , Rfl:   Past Medical History: Past Medical History:  Diagnosis Date   Acute torn meniscus of knee, right, sequela    loose bodies also   Allergic  rhinitis    Anxiety    Benign localized prostatic hyperplasia with lower urinary tract symptoms (LUTS)    urologist--- dr Elvera Lennox. [facility]   Bladder outlet obstruction    DM type 2 (diabetes mellitus, type 2) (HCC)    followed by pcp   (08-07-2021 per pt checks blood dialy in am,  fasting sugar-- 114--125)   GERD (gastroesophageal reflux disease)    History of COVID-19 05/2021   per pt positive home test , mild to moderate symptoms,  all symptoms resolved with exception occasional dry cough but could be from allergies   Hyperlipemia    IDA (iron deficiency anemia)    on iron po   OA (osteoarthritis)    OSA on CPAP    PER PT USES NIGHTLY   Tightness in chest    occ with exertion  riding mountain bike;  pt referred to cardiology for evaluation ,  dr g. Jacques Navy , lov note in epic 07-15-2021 (nuclear stress test 06-02-2021 (epic) showed low risk with no ischemia, nuclear ef 50%, hypertensive response with exercise) and echo in care everywhere 07-05-2020 ef 55-60% mild MR   Wears glasses     Tobacco Use: Social History   Tobacco Use  Smoking Status Never  Smokeless Tobacco Never    Labs: Review Flowsheet       06/02/2023  Labs for ITP Cardiac and Pulmonary Rehab  Hemoglobin A1c 6.7        Details       This result is from an external source.         Capillary Blood Glucose: Lab Results  Component Value Date   GLUCAP 188 (H) 09/08/2023   GLUCAP 141 (H) 09/08/2023   GLUCAP 108 (H) 08/12/2021   GLUCAP 109 (H) 08/12/2021     Exercise Target Goals: Exercise Program Goal: Individual exercise prescription set using results from initial 6 min walk test and THRR while considering  patient's activity barriers and safety.   Exercise Prescription Goal: Initial exercise prescription builds to 30-45 minutes a day of aerobic activity, 2-3 days per week.  Home exercise guidelines will be given to patient during program as part of exercise prescription that the participant will acknowledge.  Activity Barriers & Risk Stratification:  Activity Barriers & Cardiac Risk Stratification - 09/30/23 1009       Activity Barriers & Cardiac Risk Stratification   Activity Barriers Arthritis;Joint Problems    Cardiac Risk Stratification High   <5 METs on            6 Minute Walk:  6 Minute Walk     Row Name 09/30/23 1106         6 Minute Walk   Phase Initial     Distance 2040 feet     Walk Time 6 minutes     # of Rest Breaks 0     MPH 3.86     METS 4.38     RPE 9     Perceived Dyspnea  0     VO2 Peak 15.31     Symptoms No     Resting HR 80 bpm     Resting BP 96/58  recheck 112/60     Resting Oxygen Saturation  94 %     Exercise Oxygen  Saturation  during 6 min walk 97 %     Max Ex. HR 121 bpm     Max Ex. BP 130/66     2 Minute Post BP 108/64  Oxygen Initial Assessment:   Oxygen Re-Evaluation:   Oxygen Discharge (Final Oxygen Re-Evaluation):   Initial Exercise Prescription:  Initial Exercise Prescription - 09/30/23 1100       Date of Initial Exercise RX and Referring Provider   Date 09/30/23    Referring Provider Weston Brass, MD    Expected Discharge Date 12/22/23      Bike   Level 1    Watts 50    Minutes 15    METs 3      Recumbant Elliptical   Level 1    RPM 75    Watts 90    Minutes 15    METs 3      Prescription Details   Frequency (times per week) 3    Duration Progress to 30 minutes of continuous aerobic without signs/symptoms of physical distress      Intensity   THRR 40-80% of Max Heartrate 62-124    Ratings of Perceived Exertion 11-13    Perceived Dyspnea 0-4      Progression   Progression Continue progressive overload as per policy without signs/symptoms or physical distress.      Resistance Training   Training Prescription Yes    Weight 3    Reps 10-15             Perform Capillary Blood Glucose checks as needed.  Exercise Prescription Changes:   Exercise Comments:   Exercise Goals and Review:   Exercise Goals     Row Name 09/30/23 0942             Exercise Goals   Increase Physical Activity Yes       Intervention Provide advice, education, support and counseling about physical activity/exercise needs.;Develop an individualized exercise prescription for aerobic and resistive training based on initial evaluation findings, risk stratification, comorbidities and participant's personal goals.       Expected Outcomes Short Term: Attend rehab on a regular basis to increase amount of physical activity.;Long Term: Exercising regularly at least 3-5 days a week.;Long Term: Add in home exercise to make exercise part of routine and to increase  amount of physical activity.       Increase Strength and Stamina Yes       Intervention Provide advice, education, support and counseling about physical activity/exercise needs.;Develop an individualized exercise prescription for aerobic and resistive training based on initial evaluation findings, risk stratification, comorbidities and participant's personal goals.       Expected Outcomes Short Term: Increase workloads from initial exercise prescription for resistance, speed, and METs.;Short Term: Perform resistance training exercises routinely during rehab and add in resistance training at home;Long Term: Improve cardiorespiratory fitness, muscular endurance and strength as measured by increased METs and functional capacity ( )       Able to understand and use rate of perceived exertion (RPE) scale Yes       Intervention Provide education and explanation on how to use RPE scale       Expected Outcomes Short Term: Able to use RPE daily in rehab to express subjective intensity level;Long Term:  Able to use RPE to guide intensity level when exercising independently       Knowledge and understanding of Target Heart Rate Range (THRR) Yes       Intervention Provide education and explanation of THRR including how the numbers were predicted and where they are located for reference       Expected Outcomes Short Term: Able to state/look up THRR;Long Term: Able to  use THRR to govern intensity when exercising independently;Short Term: Able to use daily as guideline for intensity in rehab       Understanding of Exercise Prescription Yes       Intervention Provide education, explanation, and written materials on patient's individual exercise prescription       Expected Outcomes Short Term: Able to explain program exercise prescription;Long Term: Able to explain home exercise prescription to exercise independently                Exercise Goals Re-Evaluation :   Discharge Exercise Prescription (Final  Exercise Prescription Changes):   Nutrition:  Target Goals: Understanding of nutrition guidelines, daily intake of sodium 1500mg , cholesterol 200mg , calories 30% from fat and 7% or less from saturated fats, daily to have 5 or more servings of fruits and vegetables.  Biometrics:  Pre Biometrics - 09/30/23 0939       Pre Biometrics   Waist Circumference 45 inches    Hip Circumference 435.5 inches    Waist to Hip Ratio 0.1 %    Triceps Skinfold 9 mm    % Body Fat 29.7 %    Grip Strength 38 kg    Flexibility 11 in    Single Leg Stand 30 seconds              Nutrition Therapy Plan and Nutrition Goals:   Nutrition Assessments:  MEDIFICTS Score Key: >=70 Need to make dietary changes  40-70 Heart Healthy Diet <= 40 Therapeutic Level Cholesterol Diet    Picture Your Plate Scores: <16 Unhealthy dietary pattern with much room for improvement. 41-50 Dietary pattern unlikely to meet recommendations for good health and room for improvement. 51-60 More healthful dietary pattern, with some room for improvement.  >60 Healthy dietary pattern, although there may be some specific behaviors that could be improved.    Nutrition Goals Re-Evaluation:   Nutrition Goals Re-Evaluation:   Nutrition Goals Discharge (Final Nutrition Goals Re-Evaluation):   Psychosocial: Target Goals: Acknowledge presence or absence of significant depression and/or stress, maximize coping skills, provide positive support system. Participant is able to verbalize types and ability to use techniques and skills needed for reducing stress and depression.  Initial Review & Psychosocial Screening:  Initial Psych Review & Screening - 09/30/23 1009       Initial Review   Current issues with None Identified;Current Psychotropic Meds      Family Dynamics   Good Support System? Yes   Patrick Cooley has his wife for support   Comments Patrick Cooley denies any current feelings of anxiety/depression/stress. He is taking Zoloft  and feels it is working well for him.      Barriers   Psychosocial barriers to participate in program There are no identifiable barriers or psychosocial needs.      Screening Interventions   Interventions Encouraged to exercise;Provide feedback about the scores to participant    Expected Outcomes Short Term goal: Identification and review with participant of any Quality of Life or Depression concerns found by scoring the questionnaire.;Long Term goal: The participant improves quality of Life and PHQ9 Scores as seen by post scores and/or verbalization of changes             Quality of Life Scores:  Quality of Life - 09/30/23 1022       Quality of Life   Select Quality of Life      Quality of Life Scores   Health/Function Pre 28.23 %    Socioeconomic Pre 28.57 %  Psych/Spiritual Pre 30 %    Family Pre 30 %    GLOBAL Pre 28.93 %            Scores of 19 and below usually indicate a poorer quality of life in these areas.  A difference of  2-3 points is a clinically meaningful difference.  A difference of 2-3 points in the total score of the Quality of Life Index has been associated with significant improvement in overall quality of life, self-image, physical symptoms, and general health in studies assessing change in quality of life.  PHQ-9: Review Flowsheet       09/30/2023  Depression screen PHQ 2/9  Decreased Interest 0  Down, Depressed, Hopeless 0  PHQ - 2 Score 0  Altered sleeping 1  Tired, decreased energy 1  Change in appetite 0  Feeling bad or failure about yourself  0  Trouble concentrating 0  Moving slowly or fidgety/restless 0  Suicidal thoughts 0  PHQ-9 Score 2  Difficult doing work/chores Not difficult at all    Details           Interpretation of Total Score  Total Score Depression Severity:  1-4 = Minimal depression, 5-9 = Mild depression, 10-14 = Moderate depression, 15-19 = Moderately severe depression, 20-27 = Severe depression    Psychosocial Evaluation and Intervention:   Psychosocial Re-Evaluation:   Psychosocial Discharge (Final Psychosocial Re-Evaluation):   Vocational Rehabilitation: Provide vocational rehab assistance to qualifying candidates.   Vocational Rehab Evaluation & Intervention:  Vocational Rehab - 09/30/23 1011       Initial Vocational Rehab Evaluation & Intervention   Assessment shows need for Vocational Rehabilitation No   Patrick Cooley is retired            Education: Education Goals: Education classes will be provided on a weekly basis, covering required topics. Participant will state understanding/return demonstration of topics presented.     Core Videos: Exercise    Move It!  Clinical staff conducted group or individual video education with verbal and written material and guidebook.  Patient learns the recommended Pritikin exercise program. Exercise with the goal of living a long, healthy life. Some of the health benefits of exercise include controlled diabetes, healthier blood pressure levels, improved cholesterol levels, improved heart and lung capacity, improved sleep, and better body composition. Everyone should speak with their doctor before starting or changing an exercise routine.  Biomechanical Limitations Clinical staff conducted group or individual video education with verbal and written material and guidebook.  Patient learns how biomechanical limitations can impact exercise and how we can mitigate and possibly overcome limitations to have an impactful and balanced exercise routine.  Body Composition Clinical staff conducted group or individual video education with verbal and written material and guidebook.  Patient learns that body composition (ratio of muscle mass to fat mass) is a key component to assessing overall fitness, rather than body weight alone. Increased fat mass, especially visceral belly fat, can put [place] at increased risk for metabolic syndrome, type 2  diabetes, heart disease, and even death. It is recommended to combine diet and exercise (cardiovascular and resistance training) to improve your body composition. Seek guidance from your physician and exercise physiologist before implementing an exercise routine.  Exercise Action Plan Clinical staff conducted group or individual video education with verbal and written material and guidebook.  Patient learns the recommended strategies to achieve and enjoy long-term exercise adherence, including variety, self-motivation, self-efficacy, and positive decision making. Benefits of exercise include fitness, good  health, weight management, more energy, better sleep, less stress, and overall well-being.  Medical   Heart Disease Risk Reduction Clinical staff conducted group or individual video education with verbal and written material and guidebook.  Patient learns our heart is our most vital organ as it circulates oxygen, nutrients, white blood cells, and hormones throughout the entire body, and carries waste away. Data supports a plant-based eating plan like the Pritikin Program for its effectiveness in slowing progression of and reversing heart disease. The video provides a number of recommendations to address heart disease.   Metabolic Syndrome and Belly Fat  Clinical staff conducted group or individual video education with verbal and written material and guidebook.  Patient learns what metabolic syndrome is, how it leads to heart disease, and how one can reverse it and keep it from coming back. You have metabolic syndrome if you have 3 of the following 5 criteria: abdominal obesity, high blood pressure, high triglycerides, low HDL cholesterol, and high blood sugar.  Hypertension and Heart Disease Clinical staff conducted group or individual video education with verbal and written material and guidebook.  Patient learns that high blood pressure, or hypertension, is very common in the [place].  Hypertension is largely due to excessive salt intake, but other important risk factors include being overweight, physical inactivity, drinking too much alcohol, smoking, and not eating enough potassium from fruits and vegetables. High blood pressure is a leading risk factor for heart attack, stroke, congestive heart failure, dementia, kidney failure, and premature death. Long-term effects of excessive salt intake include stiffening of the arteries and thickening of heart muscle and organ damage. Recommendations include ways to reduce hypertension and the risk of heart disease.  Diseases of Our Time - Focusing on Diabetes Clinical staff conducted group or individual video education with verbal and written material and guidebook.  Patient learns why the best way to stop diseases of our time is prevention, through food and other lifestyle changes. Medicine (such as prescription pills and surgeries) is often only a Band-Aid on the problem, not a long-term solution. Most common diseases of our time include obesity, type 2 diabetes, hypertension, heart disease, and cancer. The Pritikin Program is recommended and has been proven to help reduce, reverse, and/or prevent the damaging effects of metabolic syndrome.  Nutrition   Overview of the Pritikin Eating Plan  Clinical staff conducted group or individual video education with verbal and written material and guidebook.  Patient learns about the Pritikin Eating Plan for disease risk reduction. The Pritikin Eating Plan emphasizes a wide variety of unrefined, minimally-processed carbohydrates, like fruits, vegetables, whole grains, and legumes. Go, Caution, and Stop food choices are explained. Plant-based and lean animal proteins are emphasized. Rationale provided for low sodium intake for blood pressure control, low added sugars for blood sugar stabilization, and low added fats and oils for coronary artery disease risk reduction and weight management.  Calorie  Density  Clinical staff conducted group or individual video education with verbal and written material and guidebook.  Patient learns about calorie density and how it impacts the Pritikin Eating Plan. Knowing the characteristics of the food you choose will help you decide whether those foods will lead to weight gain or weight loss, and whether you want to consume more or less of them. Weight loss is usually a side effect of the Pritikin Eating Plan because of its focus on low calorie-dense foods.  Label Reading  Clinical staff conducted group or individual video education with verbal and written  material and guidebook.  Patient learns about the Pritikin recommended label reading guidelines and corresponding recommendations regarding calorie density, added sugars, sodium content, and whole grains.  Dining Out - Part 1  Clinical staff conducted group or individual video education with verbal and written material and guidebook.  Patient learns that restaurant meals can be sabotaging because they can be so high in calories, fat, sodium, and/or sugar. Patient learns recommended strategies on how to positively address this and avoid unhealthy pitfalls.  Facts on Fats  Clinical staff conducted group or individual video education with verbal and written material and guidebook.  Patient learns that lifestyle modifications can be just as effective, if not more so, as many medications for lowering your risk of heart disease. A Pritikin lifestyle can help to reduce your risk of inflammation and atherosclerosis (cholesterol build-up, or plaque, in the artery walls). Lifestyle interventions such as dietary choices and physical activity address the cause of atherosclerosis. A review of the types of fats and their impact on blood cholesterol levels, along with dietary recommendations to reduce fat intake is also included.  Nutrition Action Plan  Clinical staff conducted group or individual video education with  verbal and written material and guidebook.  Patient learns how to incorporate Pritikin recommendations into their lifestyle. Recommendations include planning and keeping personal health goals in mind as an important part of their success.  Healthy Mind-Set    Healthy Minds, Bodies, Hearts  Clinical staff conducted group or individual video education with verbal and written material and guidebook.  Patient learns how to identify when they are stressed. Video will discuss the impact of that stress, as well as the many benefits of stress management. Patient will also be introduced to stress management techniques. The way we think, act, and feel has an impact on our hearts.  How Our Thoughts Can Heal Our Hearts  Clinical staff conducted group or individual video education with verbal and written material and guidebook.  Patient learns that negative thoughts can cause depression and anxiety. This can result in negative lifestyle behavior and serious health problems. Cognitive behavioral therapy is an effective method to help control our thoughts in order to change and improve our emotional outlook.  Additional Videos:  Exercise    Improving Performance  Clinical staff conducted group or individual video education with verbal and written material and guidebook.  Patient learns to use a non-linear approach by alternating intensity levels and lengths of time spent exercising to help burn more calories and lose more body fat. Cardiovascular exercise helps improve heart health, metabolism, hormonal balance, blood sugar control, and recovery from fatigue. Resistance training improves strength, endurance, balance, coordination, reaction time, metabolism, and muscle mass. Flexibility exercise improves circulation, posture, and balance. Seek guidance from your physician and exercise physiologist before implementing an exercise routine and learn your capabilities and proper form for all exercise.  Introduction  to Yoga  Clinical staff conducted group or individual video education with verbal and written material and guidebook.  Patient learns about yoga, a discipline of the coming together of mind, breath, and body. The benefits of yoga include improved flexibility, improved range of motion, better posture and core strength, increased lung function, weight loss, and positive self-image. Yoga's heart health benefits include lowered blood pressure, healthier heart rate, decreased cholesterol and triglyceride levels, improved immune function, and reduced stress. Seek guidance from your physician and exercise physiologist before implementing an exercise routine and learn your capabilities and proper form for all exercise.  Medical  Aging: Manufacturing systems engineer of [employer]  Clinical staff conducted group or individual video education with verbal and written material and guidebook.  Patient learns key strategies and recommendations to stay in good physical health and enhance quality of life, such as prevention strategies, having an advocate, securing a Health Care Proxy and Power of Attorney, and keeping a list of medications and system for tracking them. It also discusses how to avoid risk for bone loss.  Biology of Weight Control  Clinical staff conducted group or individual video education with verbal and written material and guidebook.  Patient learns that weight gain occurs because we consume more calories than we burn (eating more, moving less). Even if your body weight is normal, you may have higher ratios of fat compared to muscle mass. Too much body fat puts you at increased risk for cardiovascular disease, heart attack, stroke, type 2 diabetes, and obesity-related cancers. In addition to exercise, following the Pritikin Eating Plan can help reduce your risk.  Decoding Lab Results  Clinical staff conducted group or individual video education with verbal and written material and guidebook.  Patient learns  that lab test reflects one measurement whose values change over time and are influenced by many factors, including medication, stress, sleep, exercise, food, hydration, pre-existing medical conditions, and more. It is recommended to use the knowledge from this video to become more involved with your lab results and evaluate your numbers to speak with your doctor.   Diseases of Our Time - Overview  Clinical staff conducted group or individual video education with verbal and written material and guidebook.  Patient learns that according to the CDC, 50% to 70% of chronic diseases (such as obesity, type 2 diabetes, elevated lipids, hypertension, and heart disease) are avoidable through lifestyle improvements including healthier food choices, listening to satiety cues, and increased physical activity.  Sleep Disorders Clinical staff conducted group or individual video education with verbal and written material and guidebook.  Patient learns how good quality and duration of sleep are important to overall health and well-being. Patient also learns about sleep disorders and how they impact health along with recommendations to address them, including discussing with a physician.  Nutrition  Dining Out - Part 2 Clinical staff conducted group or individual video education with verbal and written material and guidebook.  Patient learns how to plan ahead and communicate in order to maximize their dining experience in a healthy and nutritious manner. Included are recommended food choices based on the type of restaurant the patient is visiting.   Fueling a Banker conducted group or individual video education with verbal and written material and guidebook.  There is a strong connection between our food choices and our health. Diseases like obesity and type 2 diabetes are very prevalent and are in large-part due to lifestyle choices. The Pritikin Eating Plan provides plenty of food and  hunger-curbing satisfaction. It is easy to follow, affordable, and helps reduce health risks.  Menu Workshop  Clinical staff conducted group or individual video education with verbal and written material and guidebook.  Patient learns that restaurant meals can sabotage health goals because they are often packed with calories, fat, sodium, and sugar. Recommendations include strategies to plan ahead and to communicate with the manager, chef, or server to help order a healthier meal.  Planning Your Eating Strategy  Clinical staff conducted group or individual video education with verbal and written material and guidebook.  Patient learns about the Pritikin Eating Plan  and its benefit of reducing the risk of disease. The Pritikin Eating Plan does not focus on calories. Instead, it emphasizes high-quality, nutrient-rich foods. By knowing the characteristics of the foods, we choose, we can determine their calorie density and make informed decisions.  Targeting Your Nutrition Priorities  Clinical staff conducted group or individual video education with verbal and written material and guidebook.  Patient learns that lifestyle habits have a tremendous impact on disease risk and progression. This video provides eating and physical activity recommendations based on your personal health goals, such as reducing LDL cholesterol, losing weight, preventing or controlling type 2 diabetes, and reducing high blood pressure.  Vitamins and Minerals  Clinical staff conducted group or individual video education with verbal and written material and guidebook.  Patient learns different ways to obtain key vitamins and minerals, including through a recommended healthy diet. It is important to discuss all supplements you take with your doctor.   Healthy Mind-Set    Smoking Cessation  Clinical staff conducted group or individual video education with verbal and written material and guidebook.  Patient learns that cigarette  smoking and tobacco addiction pose a serious health risk which affects millions of people. Stopping smoking will significantly reduce the risk of heart disease, lung disease, and many forms of cancer. Recommended strategies for quitting are covered, including working with your doctor to develop a successful plan.  Culinary   Becoming a Set designer conducted group or individual video education with verbal and written material and guidebook.  Patient learns that cooking at home can be healthy, cost-effective, quick, and puts them in control. Keys to cooking healthy recipes will include looking at your recipe, assessing your equipment needs, planning ahead, making it simple, choosing cost-effective seasonal ingredients, and limiting the use of added fats, salts, and sugars.  Cooking - Breakfast and Snacks  Clinical staff conducted group or individual video education with verbal and written material and guidebook.  Patient learns how important breakfast is to satiety and nutrition through the entire day. Recommendations include key foods to eat during breakfast to help stabilize blood sugar levels and to prevent overeating at meals later in the day. Planning ahead is also a key component.  Cooking - Educational psychologist conducted group or individual video education with verbal and written material and guidebook.  Patient learns eating strategies to improve overall health, including an approach to cook more at home. Recommendations include thinking of animal protein as a side on your plate rather than center stage and focusing instead on lower calorie dense options like vegetables, fruits, whole grains, and plant-based proteins, such as beans. Making sauces in large quantities to freeze for later and leaving the skin on your vegetables are also recommended to maximize your experience.  Cooking - Healthy Salads and Dressing Clinical staff conducted group or individual video  education with verbal and written material and guidebook.  Patient learns that vegetables, fruits, whole grains, and legumes are the foundations of the Pritikin Eating Plan. Recommendations include how to incorporate each of these in flavorful and healthy salads, and how to create homemade salad dressings. Proper handling of ingredients is also covered. Cooking - Soups and [employer] - Soups and Desserts Clinical staff conducted group or individual video education with verbal and written material and guidebook.  Patient learns that Pritikin soups and desserts make for easy, nutritious, and delicious snacks and meal components that are low in sodium, fat, sugar, and calorie density,  while high in vitamins, minerals, and filling fiber. Recommendations include simple and healthy ideas for soups and desserts.   Overview     The Pritikin Solution Program Overview Clinical staff conducted group or individual video education with verbal and written material and guidebook.  Patient learns that the results of the Pritikin Program have been documented in more than 100 articles published in peer-reviewed journals, and the benefits include reducing risk factors for (and, in some cases, even reversing) high cholesterol, high blood pressure, type 2 diabetes, obesity, and more! An overview of the three key pillars of the Pritikin Program will be covered: eating well, doing regular exercise, and having a healthy mind-set.  WORKSHOPS  Exercise: Exercise Basics: Building Your Action Plan Clinical staff led group instruction and group discussion with PowerPoint presentation and patient guidebook. To enhance the learning environment the use of posters, models and videos may be added. At the conclusion of this workshop, patients will comprehend the difference between physical activity and exercise, as well as the benefits of incorporating both, into their routine. Patients will understand the FITT (Frequency,  Intensity, Time, and Type) principle and how to use it to build an exercise action plan. In addition, safety concerns and other considerations for exercise and cardiac rehab will be addressed by the presenter. The purpose of this lesson is to promote a comprehensive and effective weekly exercise routine in order to improve patients' overall level of fitness.   Managing Heart Disease: Your Path to a Healthier Heart Clinical staff led group instruction and group discussion with PowerPoint presentation and patient guidebook. To enhance the learning environment the use of posters, models and videos may be added.At the conclusion of this workshop, patients will understand the anatomy and physiology of the heart. Additionally, they will understand how Pritikin's three pillars impact the risk factors, the progression, and the management of heart disease.  The purpose of this lesson is to provide a high-level overview of the heart, heart disease, and how the Pritikin lifestyle positively impacts risk factors.  Exercise Biomechanics Clinical staff led group instruction and group discussion with PowerPoint presentation and patient guidebook. To enhance the learning environment the use of posters, models and videos may be added. Patients will learn how the structural parts of their bodies function and how these functions impact their daily activities, movement, and exercise. Patients will learn how to promote a neutral spine, learn how to manage pain, and identify ways to improve their physical movement in order to promote healthy living. The purpose of this lesson is to expose patients to common physical limitations that impact physical activity. Participants will learn practical ways to adapt and manage aches and pains, and to minimize their effect on regular exercise. Patients will learn how to maintain good posture while sitting, walking, and lifting.  Balance Training and Fall Prevention  Clinical  staff led group instruction and group discussion with PowerPoint presentation and patient guidebook. To enhance the learning environment the use of posters, models and videos may be added. At the conclusion of this workshop, patients will understand the importance of their sensorimotor skills (vision, proprioception, and the vestibular system) in maintaining their ability to balance as they age. Patients will apply a variety of balancing exercises that are appropriate for their current level of function. Patients will understand the common causes for poor balance, possible solutions to these problems, and ways to modify their physical environment in order to minimize their fall risk. The purpose of this lesson is to teach  patients about the importance of maintaining balance as they age and ways to minimize their risk of falling.  WORKSHOPS   Nutrition:  Fueling a Ship broker led group instruction and group discussion with PowerPoint presentation and patient guidebook. To enhance the learning environment the use of posters, models and videos may be added. Patients will review the foundational principles of the Pritikin Eating Plan and understand what constitutes a serving size in each of the food groups. Patients will also learn Pritikin-friendly foods that are better choices when away from home and review make-ahead meal and snack options. Calorie density will be reviewed and applied to three nutrition priorities: weight maintenance, weight loss, and weight gain. The purpose of this lesson is to reinforce (in a group setting) the key concepts around what patients are recommended to eat and how to apply these guidelines when away from home by planning and selecting Pritikin-friendly options. Patients will understand how calorie density may be adjusted for different weight management goals.  Mindful Eating  Clinical staff led group instruction and group discussion with PowerPoint  presentation and patient guidebook. To enhance the learning environment the use of posters, models and videos may be added. Patients will briefly review the concepts of the Pritikin Eating Plan and the importance of low-calorie dense foods. The concept of mindful eating will be introduced as well as the importance of paying attention to internal hunger signals. Triggers for non-hunger eating and techniques for dealing with triggers will be explored. The purpose of this lesson is to provide patients with the opportunity to review the basic principles of the Pritikin Eating Plan, discuss the value of eating mindfully and how to measure internal cues of hunger and fullness using the Hunger Scale. Patients will also discuss reasons for non-hunger eating and learn strategies to use for controlling emotional eating.  Targeting Your Nutrition Priorities Clinical staff led group instruction and group discussion with PowerPoint presentation and patient guidebook. To enhance the learning environment the use of posters, models and videos may be added. Patients will learn how to determine their genetic susceptibility to disease by reviewing their family history. Patients will gain insight into the importance of diet as part of an overall healthy lifestyle in mitigating the impact of genetics and other environmental insults. The purpose of this lesson is to provide patients with the opportunity to assess their personal nutrition priorities by looking at their family history, their own health history and current risk factors. Patients will also be able to discuss ways of prioritizing and modifying the Pritikin Eating Plan for their highest risk areas  Menu  Clinical staff led group instruction and group discussion with PowerPoint presentation and patient guidebook. To enhance the learning environment the use of posters, models and videos may be added. Using menus brought in from [employer], or printed from [employer], patients will apply the Pritikin dining out guidelines that were presented in the [employer] video. Patients will also be able to practice these guidelines in a variety of provided scenarios. The purpose of this lesson is to provide patients with the opportunity to practice hands-on learning of the Pritikin Dining Out guidelines with actual menus and practice scenarios.  Label Reading Clinical staff led group instruction and group discussion with PowerPoint presentation and patient guidebook. To enhance the learning environment the use of posters, models and videos may be added. Patients will review and discuss the Pritikin label reading guidelines presented in Pritikin's Label Reading Educational series  video. Using fool labels brought in from local grocery stores and markets, patients will apply the label reading guidelines and determine if the packaged food meet the Pritikin guidelines. The purpose of this lesson is to provide patients with the opportunity to review, discuss, and practice hands-on learning of the Pritikin Label Reading guidelines with actual packaged food labels. Cooking School  Pritikin's [employer] are designed to teach patients ways to prepare quick, simple, and affordable recipes at home. The importance of nutrition's role in chronic disease risk reduction is reflected in its emphasis in the overall Pritikin program. By learning how to prepare essential core Pritikin Eating Plan recipes, patients will increase control over what they eat; be able to customize the flavor of foods without the use of added salt, sugar, or fat; and improve the quality of the food they consume. By learning a set of core recipes which are easily assembled, quickly prepared, and affordable, patients are more likely to prepare more healthy foods at home. These workshops focus on convenient breakfasts, simple entres, side dishes, and desserts which can be prepared with  minimal effort and are consistent with nutrition recommendations for cardiovascular risk reduction. Cooking [employer] are taught by a Armed forces logistics/support/administrative officer (RD) who has been trained by the [employer]. The chef or RD has a clear understanding of the importance of minimizing - if not completely eliminating - added fat, sugar, and sodium in recipes. Throughout the series of Cooking School Workshop sessions, patients will learn about healthy ingredients and efficient methods of cooking to build confidence in their capability to prepare    Cooking School weekly topics:  Adding Flavor- Sodium-Free  Fast and Healthy Breakfasts  Powerhouse Plant-Based Proteins  Satisfying Salads and Dressings  Simple Sides and Sauces  International Cuisine-Spotlight on the [employer] Zones  Delicious Desserts  Savory Soups  [employer] - Meals in a Astronomer Appetizers and Snacks  Comforting Weekend Breakfasts  One-Pot Wonders   Fast Evening Meals  Landscape architect Your Pritikin Plate  WORKSHOPS   Healthy Mindset (Psychosocial):  Focused Goals, Sustainable Changes Clinical staff led group instruction and group discussion with PowerPoint presentation and patient guidebook. To enhance the learning environment the use of posters, models and videos may be added. Patients will be able to apply effective goal setting strategies to establish at least one personal goal, and then take consistent, meaningful action toward that goal. They will learn to identify common barriers to achieving personal goals and develop strategies to overcome them. Patients will also gain an understanding of how our mind-set can impact our ability to achieve goals and the importance of cultivating a positive and growth-oriented mind-set. The purpose of this lesson is to provide patients with a deeper understanding of how to set and achieve personal goals, as well as the tools and strategies needed  to overcome common obstacles which may arise along the way.  From Head to Heart: The Power of a Healthy Outlook  Clinical staff led group instruction and group discussion with PowerPoint presentation and patient guidebook. To enhance the learning environment the use of posters, models and videos may be added. Patients will be able to recognize and describe the impact of emotions and mood on physical health. They will discover the importance of self-care and explore self-care practices which may work for them. Patients will also learn how to utilize the 4 C's to cultivate a healthier outlook and better manage stress and challenges. The  purpose of this lesson is to demonstrate to patients how a healthy outlook is an essential part of maintaining good health, especially as they continue their cardiac rehab journey.  Healthy Sleep for a Healthy Heart Clinical staff led group instruction and group discussion with PowerPoint presentation and patient guidebook. To enhance the learning environment the use of posters, models and videos may be added. At the conclusion of this workshop, patients will be able to demonstrate knowledge of the importance of sleep to overall health, well-being, and quality of life. They will understand the symptoms of, and treatments for, common sleep disorders. Patients will also be able to identify daytime and nighttime behaviors which impact sleep, and they will be able to apply these tools to help manage sleep-related challenges. The purpose of this lesson is to provide patients with a general overview of sleep and outline the importance of quality sleep. Patients will learn about a few of the most common sleep disorders. Patients will also be introduced to the concept of "sleep hygiene," and discover ways to self-manage certain sleeping problems through simple daily behavior changes. Finally, the workshop will motivate patients by clarifying the links between quality sleep and their  goals of heart-healthy living.   Recognizing and Reducing Stress Clinical staff led group instruction and group discussion with PowerPoint presentation and patient guidebook. To enhance the learning environment the use of posters, models and videos may be added. At the conclusion of this workshop, patients will be able to understand the types of stress reactions, differentiate between acute and chronic stress, and recognize the impact that chronic stress has on their health. They will also be able to apply different coping mechanisms, such as reframing negative self-talk. Patients will have the opportunity to practice a variety of stress management techniques, such as deep abdominal breathing, progressive muscle relaxation, and/or guided imagery.  The purpose of this lesson is to educate patients on the role of stress in their lives and to provide healthy techniques for coping with it.  Learning Barriers/Preferences:  Learning Barriers/Preferences - 09/30/23 1010       Learning Barriers/Preferences   Learning Barriers Sight   wears glasses   Learning Preferences Audio;Computer/Internet;Group Instruction;Individual Instruction;Pictoral;Skilled Demonstration;Verbal Instruction;Video;Written Material             Education Topics:  Knowledge Questionnaire Score:  Knowledge Questionnaire Score - 09/30/23 1011       Knowledge Questionnaire Score   Pre Score 21/24             Core Components/Risk Factors/Patient Goals at Admission:  Personal Goals and Risk Factors at Admission - 09/30/23 1011       Core Components/Risk Factors/Patient Goals on Admission    Weight Management Yes;Obesity;Weight Loss    Intervention Weight Management: Develop a combined nutrition and exercise program designed to reach desired caloric intake, while maintaining appropriate intake of nutrient and fiber, sodium and fats, and appropriate energy expenditure required for the weight goal.;Weight Management:  Provide education and appropriate resources to help participant work on and attain dietary goals.;Weight Management/Obesity: Establish reasonable short term and long term weight goals.;Obesity: Provide education and appropriate resources to help participant work on and attain dietary goals.    Expected Outcomes Short Term: Continue to assess and modify interventions until short term weight is achieved;Long Term: Adherence to nutrition and physical activity/exercise program aimed toward attainment of established weight goal;Weight Loss: Understanding of general recommendations for a balanced deficit meal plan, which promotes 1-2 lb weight loss per week and  includes a negative energy balance of [phone] kcal/d;Understanding recommendations for meals to include 15-35% energy as protein, 25-35% energy from fat, 35-60% energy from carbohydrates, less than 200mg  of dietary cholesterol, 20-35 gm of total fiber daily;Understanding of distribution of calorie intake throughout the day with the consumption of 4-5 meals/snacks    Diabetes Yes    Intervention Provide education about signs/symptoms and action to take for hypo/hyperglycemia.;Provide education about proper nutrition, including hydration, and aerobic/resistive exercise prescription along with prescribed medications to achieve blood glucose in normal ranges: Fasting glucose 65-99 mg/dL    Expected Outcomes Short Term: Participant verbalizes understanding of the signs/symptoms and immediate care of hyper/hypoglycemia, proper foot care and importance of medication, aerobic/resistive exercise and nutrition plan for blood glucose control.;Long Term: Attainment of HbA1C < 7%.    Hypertension Yes    Intervention Provide education on lifestyle modifcations including regular physical activity/exercise, weight management, moderate sodium restriction and increased consumption of fresh fruit, vegetables, and low fat dairy, alcohol moderation, and smoking  cessation.;Monitor prescription use compliance.    Expected Outcomes Short Term: Continued assessment and intervention until BP is < 140/22mm HG in hypertensive participants. < 130/4mm HG in hypertensive participants with diabetes, heart failure or chronic kidney disease.;Long Term: Maintenance of blood pressure at goal levels.    Lipids Yes    Intervention Provide education and support for participant on nutrition & aerobic/resistive exercise along with prescribed medications to achieve LDL 70mg , HDL >40mg .    Expected Outcomes Short Term: Participant states understanding of desired cholesterol values and is compliant with medications prescribed. Participant is following exercise prescription and nutrition guidelines.;Long Term: Cholesterol controlled with medications as prescribed, with individualized exercise RX and with personalized nutrition plan. Value goals: LDL < 70mg , HDL > 40 mg.             Core Components/Risk Factors/Patient Goals Review:    Core Components/Risk Factors/Patient Goals at Discharge (Final Review):    ITP Comments:  ITP Comments     Row Name 09/30/23 0940 10/06/23 0858         ITP Comments Armanda Magic, MD: Medical Director.  Introduction to the [facility]/Intensive Cardiac Rehab.  Initial orientation packet reviewed with the patient. 30 Day ITP Review. Patrick Cooley will tenatively start exercise on 10/08/23.               Comments: See ITP comments.Thayer Headings RN BSN

## 2023-10-08 ENCOUNTER — Encounter (HOSPITAL_COMMUNITY)
Admission: RE | Admit: 2023-10-08 | Discharge: 2023-10-08 | Disposition: A | Discharge status: Home / Self Care | Payer: PPO | Source: Ambulatory Visit | Attending: Internal Medicine | Admitting: Internal Medicine

## 2023-10-08 DIAGNOSIS — Z955 Presence of coronary angioplasty implant and graft: Secondary | ICD-10-CM

## 2023-10-08 LAB — GLUCOSE, CAPILLARY: Glucose-Capillary: 198 mg/dL — ABNORMAL HIGH (ref 70–99)

## 2023-10-08 NOTE — Progress Notes (Signed)
Daily Session Note  Patient Details  Name: Patrick Cooley MRN: [id number] Date of Birth: 01-Mar-1956 Referring Provider:   Flowsheet Row INTENSIVE CARDIAC REHAB ORIENT from 09/30/2023 in [facility]  Referring Provider Weston Brass, MD       Encounter Date: 10/08/2023  Check In:  Session Check In - 10/08/23 1346       Check-In   Supervising physician immediately available to respond to emergencies [facility] MD immediately available    Physician(s) Rhea Belton, PA    Location [facility]    Staff Present Valinda Party, MS, Exercise Physiologist;David Manus Gunning, MS, ACSM-CEP, CCRP, Exercise Physiologist;Jetta Dan Humphreys BS, ACSM-CEP, Exercise Physiologist;Jalaya Sarver, RN, BSN;Johnny Porter, MS, Exercise Physiologist    Virtual Visit No    Medication changes reported     No    Fall or balance concerns reported    No    Tobacco Cessation No Change    Warm-up and Cool-down Performed as group-led instruction   CRP2 orientation today   Resistance Training Performed Yes    VAD Patient? No    PAD/SET Patient? No      Pain Assessment   Currently in Pain? No/denies    Pain Score 0-No pain    Multiple Pain Sites No             Capillary Blood Glucose: Results for orders placed or performed during the hospital encounter of 10/08/23 (from the past 24 hour(s))  Glucose, capillary     Status: Abnormal   Collection Time: 10/08/23 12:41 PM  Result Value Ref Range   Glucose-Capillary 198 (H) 70 - 99 mg/dL     Exercise Prescription Changes - 10/08/23 1646       Response to Exercise   Blood Pressure (Admit) 116/64    Blood Pressure (Exercise) 138/78    Blood Pressure (Exit) 110/68    Heart Rate (Admit) 72 bpm    Heart Rate (Exercise) 133 bpm    Heart Rate (Exit) 79 bpm    Rating of Perceived Exertion (Exercise) 10    Perceived Dyspnea (Exercise) 0    Symptoms 0    Comments Pt first day in the [facility] program    Duration Progress to 30 minutes of  aerobic without signs/symptoms of physical distress    Intensity THRR unchanged      Progression   Progression Continue to progress workloads to maintain intensity without signs/symptoms of physical distress.    Average METs 4.15      Resistance Training   Training Prescription Yes    Weight 3    Reps 10-15    Time 10 Minutes      Bike   Level 1    Watts 33    Minutes 15    METs 3.1      Recumbant Elliptical   Level 1    RPM 92    Watts 128    Minutes 15    METs 5.2             Social History   Tobacco Use  Smoking Status Never  Smokeless Tobacco Never    Goals Met:  Exercise tolerated well No report of concerns or symptoms today Strength training completed today  Goals Unmet:  Not Applicable  Comments: Pt started cardiac rehab today.  Pt tolerated light exercise without difficulty. VSS, telemetry-Sinus Rhythm, asymptomatic.  Medication list reconciled. Pt denies barriers to medicaiton compliance.  PSYCHOSOCIAL  ASSESSMENT:  PHQ-2. Pt exhibits positive coping skills, hopeful outlook with supportive family. No psychosocial needs identified at this time, no psychosocial interventions necessary.    Pt enjoys mountain biking, traveling, spending time with his grandchildren and skiing. .   Pt oriented to exercise equipment and routine.    Understanding verbalized.Thayer Headings RN BSN    Dr. Armanda Magic is Medical Director for Cardiac Rehab at [facility].

## 2023-10-11 ENCOUNTER — Encounter (HOSPITAL_COMMUNITY): Payer: PPO

## 2023-10-13 ENCOUNTER — Encounter (HOSPITAL_COMMUNITY)
Admission: RE | Admit: 2023-10-13 | Discharge: 2023-10-13 | Disposition: A | Discharge status: Home / Self Care | Payer: PPO | Source: Ambulatory Visit | Attending: Internal Medicine | Admitting: Internal Medicine

## 2023-10-13 DIAGNOSIS — Z955 Presence of coronary angioplasty implant and graft: Secondary | ICD-10-CM | POA: Diagnosis not present

## 2023-10-13 LAB — GLUCOSE, CAPILLARY: Glucose-Capillary: 137 mg/dL — ABNORMAL HIGH (ref 70–99)

## 2023-10-15 ENCOUNTER — Encounter (HOSPITAL_COMMUNITY): Payer: PPO

## 2023-10-18 ENCOUNTER — Encounter (HOSPITAL_COMMUNITY)
Admission: RE | Admit: 2023-10-18 | Discharge: 2023-10-18 | Disposition: A | Discharge status: Home / Self Care | Payer: PPO | Source: Ambulatory Visit | Attending: Internal Medicine | Admitting: Internal Medicine

## 2023-10-18 DIAGNOSIS — Z7902 Long term (current) use of antithrombotics/antiplatelets: Secondary | ICD-10-CM | POA: Insufficient documentation

## 2023-10-18 DIAGNOSIS — Z955 Presence of coronary angioplasty implant and graft: Secondary | ICD-10-CM | POA: Insufficient documentation

## 2023-10-18 DIAGNOSIS — Z48812 Encounter for surgical aftercare following surgery on the circulatory system: Secondary | ICD-10-CM | POA: Diagnosis not present

## 2023-10-18 LAB — GLUCOSE, CAPILLARY
Glucose-Capillary: 247 mg/dL — ABNORMAL HIGH (ref 70–99)
Glucose-Capillary: 211 mg/dL — ABNORMAL HIGH (ref 70–99)

## 2023-10-19 DIAGNOSIS — K219 Gastro-esophageal reflux disease without esophagitis: Secondary | ICD-10-CM | POA: Diagnosis not present

## 2023-10-19 DIAGNOSIS — J019 Acute sinusitis, unspecified: Secondary | ICD-10-CM | POA: Diagnosis not present

## 2023-10-19 DIAGNOSIS — J209 Acute bronchitis, unspecified: Secondary | ICD-10-CM | POA: Diagnosis not present

## 2023-10-20 ENCOUNTER — Encounter (HOSPITAL_COMMUNITY): Payer: PPO

## 2023-10-22 ENCOUNTER — Encounter (HOSPITAL_COMMUNITY): Payer: PPO

## 2023-10-22 ENCOUNTER — Telehealth (HOSPITAL_COMMUNITY): Payer: Self-pay

## 2023-10-22 NOTE — Telephone Encounter (Signed)
Pt called and he is not feeling well. I have canceled his appointment for today

## 2023-10-25 ENCOUNTER — Encounter (HOSPITAL_COMMUNITY)
Admission: RE | Admit: 2023-10-25 | Discharge: 2023-10-25 | Disposition: A | Discharge status: Home / Self Care | Payer: PPO | Source: Ambulatory Visit | Attending: Internal Medicine | Admitting: Internal Medicine

## 2023-10-25 DIAGNOSIS — Z955 Presence of coronary angioplasty implant and graft: Secondary | ICD-10-CM

## 2023-10-25 DIAGNOSIS — Z48812 Encounter for surgical aftercare following surgery on the circulatory system: Secondary | ICD-10-CM | POA: Diagnosis not present

## 2023-10-25 NOTE — Progress Notes (Signed)
Daily Session Note  Patient Details  Name: Patrick Cooley MRN: [id number] Date of Birth: 29-Jan-1956 Referring Provider:   Flowsheet Row INTENSIVE CARDIAC REHAB ORIENT from 09/30/2023 in [facility]  Referring Provider Weston Brass, MD       Encounter Date: 10/25/2023    Comments: Followed up with patient on PHQ-9 score. Pt declined intervention at this time regarding sleep and fatigue. Pt instructed to reach out to staff is he needs any assistance or resources. Pt understands without assistance.

## 2023-10-27 ENCOUNTER — Encounter (HOSPITAL_COMMUNITY)
Admission: RE | Admit: 2023-10-27 | Discharge: 2023-10-27 | Disposition: A | Discharge status: Home / Self Care | Payer: PPO | Source: Ambulatory Visit | Attending: Internal Medicine | Admitting: Internal Medicine

## 2023-10-27 DIAGNOSIS — Z955 Presence of coronary angioplasty implant and graft: Secondary | ICD-10-CM

## 2023-10-27 DIAGNOSIS — Z48812 Encounter for surgical aftercare following surgery on the circulatory system: Secondary | ICD-10-CM | POA: Diagnosis not present

## 2023-10-28 ENCOUNTER — Other Ambulatory Visit: Payer: Self-pay

## 2023-10-28 DIAGNOSIS — R053 Chronic cough: Secondary | ICD-10-CM | POA: Diagnosis not present

## 2023-10-28 MED ORDER — PANTOPRAZOLE SODIUM 40 MG PO TBEC: 40.0000 mg | DELAYED_RELEASE_TABLET | Freq: Every day | ORAL | 3 refills | Status: AC

## 2023-10-28 NOTE — Progress Notes (Signed)
Cardiac Individual Treatment Plan  Patient Details  Name: Patrick Cooley MRN: [id number] Date of Birth: August 21, 1956 Referring Provider:   Flowsheet Row INTENSIVE CARDIAC REHAB ORIENT from 09/30/2023 in [facility]  Referring Provider Weston Brass, MD       Initial Encounter Date:  Flowsheet Row INTENSIVE CARDIAC REHAB ORIENT from 09/30/2023 in [facility]  Date 09/30/23       Visit Diagnosis: 09/08/23 Status post coronary artery stent placement  Patient's Home Medications on Admission:  Current Outpatient Medications:    alfuzosin (UROXATRAL) 10 MG 24 hr tablet, Take 10 mg by mouth at bedtime., Disp: , Rfl:    aspirin EC 81 MG tablet, Take 81 mg by mouth at bedtime. Swallow whole., Disp: , Rfl:    atorvastatin (LIPITOR) 80 MG tablet, Take 1 tablet (80 mg total) by mouth every morning., Disp: 90 tablet, Rfl: 1   cetirizine (ZYRTEC) 10 MG tablet, Take 10 mg by mouth every morning., Disp: , Rfl:    clopidogrel (PLAVIX) 75 MG tablet, Take 1 tablet (75 mg total) by mouth daily with breakfast., Disp: 90 tablet, Rfl: 1   cyanocobalamin (VITAMIN B12) 1000 MCG tablet, Take 1,000 mcg by mouth at bedtime., Disp: , Rfl:    dapagliflozin propanediol (FARXIGA) 10 MG TABS tablet, Take 10 mg by mouth every morning., Disp: , Rfl:    ferrous sulfate 325 (65 FE) MG tablet, Take 325 mg by mouth at bedtime., Disp: , Rfl:    fluticasone (FLONASE) 50 MCG/ACT nasal spray, Place 2 sprays into both nostrils 2 (two) times daily., Disp: , Rfl:    ipratropium (ATROVENT) 0.03 % nasal spray, Place 2 sprays into both nostrils 4 (four) times daily., Disp: , Rfl:    losartan (COZAAR) 25 MG tablet, Take 1 tablet (25 mg total) by mouth daily., Disp: 90 tablet, Rfl: 3   metFORMIN (GLUCOPHAGE) 1000 MG tablet, Take 1,000 mg by mouth 2 (two) times daily with a meal., Disp: , Rfl:    montelukast  (SINGULAIR) 10 MG tablet, Take 10 mg by mouth at bedtime., Disp: , Rfl:    Multiple Vitamins-Minerals (MULTIVITAMIN WITH MINERALS) tablet, Take 1 tablet by mouth every morning., Disp: , Rfl:    naphazoline-glycerin (CLEAR EYES REDNESS) 0.012-0.25 % SOLN, Place 1-2 drops into both eyes 4 (four) times daily as needed for eye irritation., Disp: , Rfl:    nitroGLYCERIN (NITROSTAT) 0.4 MG SL tablet, Place 1 tablet (0.4 mg total) under the tongue every 5 (five) minutes as needed for chest pain., Disp: 25 tablet, Rfl: 3   pantoprazole (PROTONIX) 40 MG tablet, Take 1 tablet (40 mg total) by mouth daily., Disp: 30 tablet, Rfl: 1   polycarbophil (FIBERCON) 625 MG tablet, Take 1,250 mg by mouth 2 (two) times daily., Disp: , Rfl:    sertraline (ZOLOFT) 50 MG tablet, Take 50 mg by mouth every morning., Disp: , Rfl:    sucralfate (CARAFATE) 1 g tablet, Take 1 g by mouth daily as needed (Stomach acid)., Disp: , Rfl:    tadalafil (CIALIS) 5 MG tablet, Take 5 mg by mouth at bedtime., Disp: , Rfl:    tirzepatide (MOUNJARO) 10 MG/0.5ML Pen, Inject 10 mg into the skin once a week., Disp: , Rfl:   Past Medical History: Past Medical History:  Diagnosis Date   Acute torn meniscus of knee, right, sequela    loose bodies also   Allergic  rhinitis    Anxiety    Benign localized prostatic hyperplasia with lower urinary tract symptoms (LUTS)    urologist--- dr Elvera Lennox. [facility]   Bladder outlet obstruction    DM type 2 (diabetes mellitus, type 2) (HCC)    followed by pcp   (08-07-2021 per pt checks blood dialy in am,  fasting sugar-- 114--125)   GERD (gastroesophageal reflux disease)    History of COVID-19 05/2021   per pt positive home test , mild to moderate symptoms,  all symptoms resolved with exception occasional dry cough but could be from allergies   Hyperlipemia    IDA (iron deficiency anemia)    on iron po   OA (osteoarthritis)    OSA on CPAP    PER PT USES NIGHTLY   Tightness in chest    occ with exertion  riding mountain bike;  pt referred to cardiology for evaluation ,  dr g. Jacques Navy , lov note in epic 07-15-2021 (nuclear stress test 06-02-2021 (epic) showed low risk with no ischemia, nuclear ef 50%, hypertensive response with exercise) and echo in care everywhere 07-05-2020 ef 55-60% mild MR   Wears glasses     Tobacco Use: Social History   Tobacco Use  Smoking Status Never  Smokeless Tobacco Never    Labs: Review Flowsheet       06/02/2023  Labs for ITP Cardiac and Pulmonary Rehab  Hemoglobin A1c 6.7        Details       This result is from an external source.         Capillary Blood Glucose: Lab Results  Component Value Date   GLUCAP 211 (H) 10/18/2023   GLUCAP 137 (H) 10/13/2023   GLUCAP 247 (H) 10/13/2023   GLUCAP 198 (H) 10/08/2023   GLUCAP 188 (H) 09/08/2023     Exercise Target Goals: Exercise Program Goal: Individual exercise prescription set using results from initial 6 min walk test and THRR while considering  patient's activity barriers and safety.   Exercise Prescription Goal: Initial exercise prescription builds to 30-45 minutes a day of aerobic activity, 2-3 days per week.  Home exercise guidelines will be given to patient during program as part of exercise prescription that the participant will acknowledge.  Activity Barriers & Risk Stratification:  Activity Barriers & Cardiac Risk Stratification - 09/30/23 1009       Activity Barriers & Cardiac Risk Stratification   Activity Barriers Arthritis;Joint Problems    Cardiac Risk Stratification High   <5 METs on            6 Minute Walk:  6 Minute Walk     Row Name 09/30/23 1106         6 Minute Walk   Phase Initial     Distance 2040 feet     Walk Time 6 minutes     # of Rest Breaks 0     MPH 3.86     METS 4.38     RPE 9     Perceived Dyspnea  0     VO2 Peak 15.31     Symptoms No     Resting HR 80 bpm     Resting BP 96/58  recheck 112/60     Resting Oxygen Saturation  94 %      Exercise Oxygen Saturation  during 6 min walk 97 %     Max Ex. HR 121 bpm     Max Ex. BP 130/66  2 Minute Post BP 108/64              Oxygen Initial Assessment:   Oxygen Re-Evaluation:   Oxygen Discharge (Final Oxygen Re-Evaluation):   Initial Exercise Prescription:  Initial Exercise Prescription - 09/30/23 1100       Date of Initial Exercise RX and Referring Provider   Date 09/30/23    Referring Provider Weston Brass, MD    Expected Discharge Date 12/22/23      Bike   Level 1    Watts 50    Minutes 15    METs 3      Recumbant Elliptical   Level 1    RPM 75    Watts 90    Minutes 15    METs 3      Prescription Details   Frequency (times per week) 3    Duration Progress to 30 minutes of continuous aerobic without signs/symptoms of physical distress      Intensity   THRR 40-80% of Max Heartrate 62-124    Ratings of Perceived Exertion 11-13    Perceived Dyspnea 0-4      Progression   Progression Continue progressive overload as per policy without signs/symptoms or physical distress.      Resistance Training   Training Prescription Yes    Weight 3    Reps 10-15             Perform Capillary Blood Glucose checks as needed.  Exercise Prescription Changes:   Exercise Prescription Changes     Row Name 10/08/23 1646 10/25/23 1700           Response to Exercise   Blood Pressure (Admit) 116/64 126/58      Blood Pressure (Exercise) 138/78 134/62      Blood Pressure (Exit) 110/68 108/62      Heart Rate (Admit) 72 bpm 93 bpm      Heart Rate (Exercise) 133 bpm 124 bpm      Heart Rate (Exit) 79 bpm 97 bpm      Rating of Perceived Exertion (Exercise) 10 12      Perceived Dyspnea (Exercise) 0 --      Symptoms 0 --      Comments Pt first day in the [facility] ICR program Reviewed METs      Duration Progress to 30 minutes of  aerobic without signs/symptoms of physical distress Continue with 30 min of aerobic exercise without signs/symptoms  of physical distress.      Intensity THRR unchanged THRR unchanged        Progression   Progression Continue to progress workloads to maintain intensity without signs/symptoms of physical distress. Continue to progress workloads to maintain intensity without signs/symptoms of physical distress.      Average METs 4.15 4.7        Resistance Training   Training Prescription Yes Yes      Weight 3 3 lbs      Reps 10-15 10-15      Time 10 Minutes 10 Minutes        Interval Training   Interval Training -- No        Bike   Level 1 4      Watts 33 71      Minutes 15 15      METs 3.1 4.5        Recumbant Elliptical   Level 1 5      RPM 92 58      Watts  128 91      Minutes 15 15      METs 5.2 4.9               Exercise Comments:   Exercise Comments     Row Name 10/08/23 1428 10/25/23 1712         Exercise Comments Pt's first day in the [facility] program. Pt exercised without complaints. Reviewed METs. Pt is making progressing and increasing workloads.               Exercise Goals and Review:   Exercise Goals     Row Name 09/30/23 0942             Exercise Goals   Increase Physical Activity Yes       Intervention Provide advice, education, support and counseling about physical activity/exercise needs.;Develop an individualized exercise prescription for aerobic and resistive training based on initial evaluation findings, risk stratification, comorbidities and participant's personal goals.       Expected Outcomes Short Term: Attend rehab on a regular basis to increase amount of physical activity.;Long Term: Exercising regularly at least 3-5 days a week.;Long Term: Add in home exercise to make exercise part of routine and to increase amount of physical activity.       Increase Strength and Stamina Yes       Intervention Provide advice, education, support and counseling about physical activity/exercise needs.;Develop an individualized exercise prescription for aerobic and  resistive training based on initial evaluation findings, risk stratification, comorbidities and participant's personal goals.       Expected Outcomes Short Term: Increase workloads from initial exercise prescription for resistance, speed, and METs.;Short Term: Perform resistance training exercises routinely during rehab and add in resistance training at home;Long Term: Improve cardiorespiratory fitness, muscular endurance and strength as measured by increased METs and functional capacity ( )       Able to understand and use rate of perceived exertion (RPE) scale Yes       Intervention Provide education and explanation on how to use RPE scale       Expected Outcomes Short Term: Able to use RPE daily in rehab to express subjective intensity level;Long Term:  Able to use RPE to guide intensity level when exercising independently       Knowledge and understanding of Target Heart Rate Range (THRR) Yes       Intervention Provide education and explanation of THRR including how the numbers were predicted and where they are located for reference       Expected Outcomes Short Term: Able to state/look up THRR;Long Term: Able to use THRR to govern intensity when exercising independently;Short Term: Able to use daily as guideline for intensity in rehab       Understanding of Exercise Prescription Yes       Intervention Provide education, explanation, and written materials on patient's individual exercise prescription       Expected Outcomes Short Term: Able to explain program exercise prescription;Long Term: Able to explain home exercise prescription to exercise independently                Exercise Goals Re-Evaluation :  Exercise Goals Re-Evaluation     Row Name 10/08/23 1427             Exercise Goal Re-Evaluation   Exercise Goals Review Increase Physical Activity;Understanding of Exercise Prescription;Increase Strength and Stamina;Knowledge and understanding of Target Heart Rate Range  (THRR);Able to understand and use rate of perceived exertion (RPE)  scale       Comments Pt's first day in the [facility]2 program. Pt understnads the  exercise RX, THRR, and RPE scale.       Expected Outcomes Will continute to montior patient and progress exercise workloads as tolerated.                Discharge Exercise Prescription (Final Exercise Prescription Changes):  Exercise Prescription Changes - 10/25/23 1700       Response to Exercise   Blood Pressure (Admit) 126/58    Blood Pressure (Exercise) 134/62    Blood Pressure (Exit) 108/62    Heart Rate (Admit) 93 bpm    Heart Rate (Exercise) 124 bpm    Heart Rate (Exit) 97 bpm    Rating of Perceived Exertion (Exercise) 12    Comments Reviewed METs    Duration Continue with 30 min of aerobic exercise without signs/symptoms of physical distress.    Intensity THRR unchanged      Progression   Progression Continue to progress workloads to maintain intensity without signs/symptoms of physical distress.    Average METs 4.7      Resistance Training   Training Prescription Yes    Weight 3 lbs    Reps 10-15    Time 10 Minutes      Interval Training   Interval Training No      Bike   Level 4    Watts 71    Minutes 15    METs 4.5      Recumbant Elliptical   Level 5    RPM 58    Watts 91    Minutes 15    METs 4.9             Nutrition:  Target Goals: Understanding of nutrition guidelines, daily intake of sodium 1500mg , cholesterol 200mg , calories 30% from fat and 7% or less from saturated fats, daily to have 5 or more servings of fruits and vegetables.  Biometrics:  Pre Biometrics - 09/30/23 0939       Pre Biometrics   Waist Circumference 45 inches    Hip Circumference 435.5 inches    Waist to Hip Ratio 0.1 %    Triceps Skinfold 9 mm    % Body Fat 29.7 %    Grip Strength 38 kg    Flexibility 11 in    Single Leg Stand 30 seconds              Nutrition Therapy Plan and Nutrition Goals:  Nutrition  Therapy & Goals - 10/08/23 1407       Nutrition Therapy   Diet Heart Healthy Diet    Drug/Food Interactions Statins/Certain Fruits      Personal Nutrition Goals   Nutrition Goal Patient to identify strategies for reducing cardiovascular risk by attending the [facility] education and nutrition series weekly.    Personal Goal #2 Patient to improve diet quality by using the plate method as a guide for meal planning to include lean protein/plant protein, fruits, vegetables, whole grains, nonfat dairy as part of a well-balanced diet.    Personal Goal #3 Patient to limit sodium to 2300mg  per day    Comments Jibrael has medical history of CAD, Hyperlipidemia, DM2, OSA, HTN, s/p coronary artery stent. He is taking mounjaro to aid with weight loss, blood sugar control. LDL remains at goal <70 (65). His wife is motivated to make lifestyle changes as well. Patient will benefit from participation in intensive cardiac rehab for nutrition, exercise, and  lifestyle modification.      Intervention Plan   Intervention Prescribe, educate and counsel regarding individualized specific dietary modifications aiming towards targeted core components such as weight, hypertension, lipid management, diabetes, heart failure and other comorbidities.;Nutrition handout(s) given to patient.    Expected Outcomes Short Term Goal: Understand basic principles of dietary content, such as calories, fat, sodium, cholesterol and nutrients.;Long Term Goal: Adherence to prescribed nutrition plan.             Nutrition Assessments:  MEDIFICTS Score Key: >=70 Need to make dietary changes  40-70 Heart Healthy Diet <= 40 Therapeutic Level Cholesterol Diet    Picture Your Plate Scores: <14 Unhealthy dietary pattern with much room for improvement. 41-50 Dietary pattern unlikely to meet recommendations for good health and room for improvement. 51-60 More healthful dietary pattern, with some room for improvement.  >60 Healthy dietary  pattern, although there may be some specific behaviors that could be improved.    Nutrition Goals Re-Evaluation:  Nutrition Goals Re-Evaluation     Row Name 10/08/23 1407             Goals   Current Weight 199 lb 8.3 oz (90.5 kg)       Comment LDL 65, triglycerides 154, lipoproteinA WNL, A1c 6.7       Expected Outcome Chuck has medical history of CAD, Hyperlipidemia, DM2, OSA, HTN, s/p coronary artery stent. He is taking mounjaro to aid with weight loss, blood sugar control. LDL remains at goal <70 (65). His wife is motivated to make lifestyle changes as well. Patient will benefit from participation in intensive cardiac rehab for nutrition, exercise, and lifestyle modification.                Nutrition Goals Re-Evaluation:  Nutrition Goals Re-Evaluation     Row Name 10/08/23 1407             Goals   Current Weight 199 lb 8.3 oz (90.5 kg)       Comment LDL 65, triglycerides 154, lipoproteinA WNL, A1c 6.7       Expected Outcome Jah has medical history of CAD, Hyperlipidemia, DM2, OSA, HTN, s/p coronary artery stent. He is taking mounjaro to aid with weight loss, blood sugar control. LDL remains at goal <70 (65). His wife is motivated to make lifestyle changes as well. Patient will benefit from participation in intensive cardiac rehab for nutrition, exercise, and lifestyle modification.                Nutrition Goals Discharge (Final Nutrition Goals Re-Evaluation):  Nutrition Goals Re-Evaluation - 10/08/23 1407       Goals   Current Weight 199 lb 8.3 oz (90.5 kg)    Comment LDL 65, triglycerides 154, lipoproteinA WNL, A1c 6.7    Expected Outcome Elieser has medical history of CAD, Hyperlipidemia, DM2, OSA, HTN, s/p coronary artery stent. He is taking mounjaro to aid with weight loss, blood sugar control. LDL remains at goal <70 (65). His wife is motivated to make lifestyle changes as well. Patient will benefit from participation in intensive cardiac rehab for nutrition,  exercise, and lifestyle modification.             Psychosocial: Target Goals: Acknowledge presence or absence of significant depression and/or stress, maximize coping skills, provide positive support system. Participant is able to verbalize types and ability to use techniques and skills needed for reducing stress and depression.  Initial Review & Psychosocial Screening:  Initial Psych Review & Screening -  09/30/23 1009       Initial Review   Current issues with None Identified;Current Psychotropic Meds      Family Dynamics   Good Support System? Yes   Arno has his wife for support   Comments Alcee denies any current feelings of anxiety/depression/stress. He is taking Zoloft and feels it is working well for him.      Barriers   Psychosocial barriers to participate in program There are no identifiable barriers or psychosocial needs.      Screening Interventions   Interventions Encouraged to exercise;Provide feedback about the scores to participant    Expected Outcomes Short Term goal: Identification and review with participant of any Quality of Life or Depression concerns found by scoring the questionnaire.;Long Term goal: The participant improves quality of Life and PHQ9 Scores as seen by post scores and/or verbalization of changes             Quality of Life Scores:  Quality of Life - 09/30/23 1022       Quality of Life   Select Quality of Life      Quality of Life Scores   Health/Function Pre 28.23 %    Socioeconomic Pre 28.57 %    Psych/Spiritual Pre 30 %    Family Pre 30 %    GLOBAL Pre 28.93 %            Scores of 19 and below usually indicate a poorer quality of life in these areas.  A difference of  2-3 points is a clinically meaningful difference.  A difference of 2-3 points in the total score of the Quality of Life Index has been associated with significant improvement in overall quality of life, self-image, physical symptoms, and general health in studies  assessing change in quality of life.  PHQ-9: Review Flowsheet       09/30/2023  Depression screen PHQ 2/9  Decreased Interest 0  Down, Depressed, Hopeless 0  PHQ - 2 Score 0  Altered sleeping 1  Tired, decreased energy 1  Change in appetite 0  Feeling bad or failure about yourself  0  Trouble concentrating 0  Moving slowly or fidgety/restless 0  Suicidal thoughts 0  PHQ-9 Score 2  Difficult doing work/chores Not difficult at all   Interpretation of Total Score  Total Score Depression Severity:  1-4 = Minimal depression, 5-9 = Mild depression, 10-14 = Moderate depression, 15-19 = Moderately severe depression, 20-27 = Severe depression   Psychosocial Evaluation and Intervention:   Psychosocial Re-Evaluation:  Psychosocial Re-Evaluation     Row Name 10/12/23 0806 10/28/23 0845           Psychosocial Re-Evaluation   Current issues with None Identified;Current Psychotropic Meds None Identified;Current Psychotropic Meds      Comments Norm did not voice any concerns or stressors on his first day of exercise. Discussed PHQ2-9 on 10/25/23. No concerns or stressors noted at this time.      Interventions Relaxation education;Encouraged to attend Cardiac Rehabilitation for the exercise;Stress management education Relaxation education;Encouraged to attend Cardiac Rehabilitation for the exercise;Stress management education      Continue Psychosocial Services  No Follow up required No Follow up required               Psychosocial Discharge (Final Psychosocial Re-Evaluation):  Psychosocial Re-Evaluation - 10/28/23 0845       Psychosocial Re-Evaluation   Current issues with None Identified;Current Psychotropic Meds    Comments Discussed PHQ2-9 on 10/25/23. No concerns  or stressors noted at this time.    Interventions Relaxation education;Encouraged to attend Cardiac Rehabilitation for the exercise;Stress management education    Continue Psychosocial Services  No Follow up  required             Vocational Rehabilitation: Provide vocational rehab assistance to qualifying candidates.   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 Participant will state understanding/return demonstration of topics presented.    Education     Row Name 10/08/23 1500     Education   Cardiac Education Topics [facility]   Licensed conveyancer Nutrition   Nutrition Overview of the [facility] Eating Plan   Instruction Review Code 1- Verbalizes Understanding   Class Start Time 1400   Class Stop Time 1445   Class Time Calculation (min) 45 min    Row Name 10/13/23 1400     Education   Cardiac Education Topics [facility]   Nurse, children's   Educator Nurse   Nutrition Becoming a [facility] Chef   Instruction Review Code 1- Verbalizes Understanding   Class Start Time 1356    Row Name 10/18/23 1400     Education   Cardiac Education Topics [facility]   Select Core Videos     Core Videos   Educator Exercise Physiologist   Select Nutrition   Nutrition Other  Label Reading   Instruction Review Code 1- Verbalizes Understanding   Class Start Time 1356   Class Stop Time 1430   Class Time Calculation (min) 34 min    Row Name 10/25/23 1600     Education   Cardiac Education Topics [facility]   Select Workshops     Workshops   Educator Exercise Physiologist   Select Psychosocial   Psychosocial Workshop Recognizing and Reducing Stress   Instruction Review Code 1- Verbalizes Understanding   Class Start Time 1359   Class Stop Time 1445   Class Time Calculation (min) 46 min    Row Name 10/27/23 1500     Education    Cardiac Education Topics [facility]   Customer service manager   Weekly Topic Efficiency Cooking - Meals in a Snap   Instruction Review Code 1- Verbalizes Understanding   Class Start Time 1358   Class Stop Time 1438   Class Time Calculation (min) 40 min            Core Videos: Exercise    Move It!  Clinical staff conducted group or individual video education with verbal and written material and guidebook.  Patient learns the recommended [facility] exercise program. Exercise with the goal of living a long, healthy life. Some of the health benefits of exercise include controlled diabetes, healthier blood pressure levels, improved cholesterol levels, improved heart and lung capacity, improved sleep, and better body composition. Everyone should speak with their doctor before starting or changing an exercise routine.  Biomechanical Limitations Clinical staff conducted group or individual video education with verbal and written material and guidebook.  Patient learns how biomechanical limitations can impact exercise and  how we can mitigate and possibly overcome limitations to have an impactful and balanced exercise routine.  Body Composition Clinical staff conducted group or individual video education with verbal and written material and guidebook.  Patient learns that body composition (ratio of muscle mass to fat mass) is a key component to assessing overall fitness, rather than body weight alone. Increased fat mass, especially visceral belly fat, can put [place] at increased risk for metabolic syndrome, type 2 diabetes, heart disease, and even death. It is recommended to combine diet and exercise (cardiovascular and resistance training) to improve your body composition. Seek guidance from your physician and exercise physiologist before implementing an exercise routine.  Exercise Action Plan Clinical staff conducted group or individual video education with  verbal and written material and guidebook.  Patient learns the recommended strategies to achieve and enjoy long-term exercise adherence, including variety, self-motivation, self-efficacy, and positive decision making. Benefits of exercise include fitness, good health, weight management, more energy, better sleep, less stress, and overall well-being.  Medical   Heart Disease Risk Reduction Clinical staff conducted group or individual video education with verbal and written material and guidebook.  Patient learns our heart is our most vital organ as it circulates oxygen, nutrients, white blood cells, and hormones throughout the entire body, and carries waste away. Data supports a plant-based eating plan like the Pritikin Program for its effectiveness in slowing progression of and reversing heart disease. The video provides a number of recommendations to address heart disease.   Metabolic Syndrome and Belly Fat  Clinical staff conducted group or individual video education with verbal and written material and guidebook.  Patient learns what metabolic syndrome is, how it leads to heart disease, and how one can reverse it and keep it from coming back. You have metabolic syndrome if you have 3 of the following 5 criteria: abdominal obesity, high blood pressure, high triglycerides, low HDL cholesterol, and high blood sugar.  Hypertension and Heart Disease Clinical staff conducted group or individual video education with verbal and written material and guidebook.  Patient learns that high blood pressure, or hypertension, is very common in the [place]. Hypertension is largely due to excessive salt intake, but other important risk factors include being overweight, physical inactivity, drinking too much alcohol, smoking, and not eating enough potassium from fruits and vegetables. High blood pressure is a leading risk factor for heart attack, stroke, congestive heart failure, dementia, kidney failure, and  premature death. Long-term effects of excessive salt intake include stiffening of the arteries and thickening of heart muscle and organ damage. Recommendations include ways to reduce hypertension and the risk of heart disease.  Diseases of Our Time - Focusing on Diabetes Clinical staff conducted group or individual video education with verbal and written material and guidebook.  Patient learns why the best way to stop diseases of our time is prevention, through food and other lifestyle changes. Medicine (such as prescription pills and surgeries) is often only a Band-Aid on the problem, not a long-term solution. Most common diseases of our time include obesity, type 2 diabetes, hypertension, heart disease, and cancer. The Pritikin Program is recommended and has been proven to help reduce, reverse, and/or prevent the damaging effects of metabolic syndrome.  Nutrition   Overview of the Pritikin Eating Plan  Clinical staff conducted group or individual video education with verbal and written material and guidebook.  Patient learns about the Pritikin Eating Plan for disease risk reduction. The Pritikin Eating Plan emphasizes a wide variety of  unrefined, minimally-processed carbohydrates, like fruits, vegetables, whole grains, and legumes. Go, Caution, and Stop food choices are explained. Plant-based and lean animal proteins are emphasized. Rationale provided for low sodium intake for blood pressure control, low added sugars for blood sugar stabilization, and low added fats and oils for coronary artery disease risk reduction and weight management.  Calorie Density  Clinical staff conducted group or individual video education with verbal and written material and guidebook.  Patient learns about calorie density and how it impacts the Pritikin Eating Plan. Knowing the characteristics of the food you choose will help you decide whether those foods will lead to weight gain or weight loss, and whether you want to  consume more or less of them. Weight loss is usually a side effect of the Pritikin Eating Plan because of its focus on low calorie-dense foods.  Label Reading  Clinical staff conducted group or individual video education with verbal and written material and guidebook.  Patient learns about the Pritikin recommended label reading guidelines and corresponding recommendations regarding calorie density, added sugars, sodium content, and whole grains.  Dining Out - Part 1  Clinical staff conducted group or individual video education with verbal and written material and guidebook.  Patient learns that restaurant meals can be sabotaging because they can be so high in calories, fat, sodium, and/or sugar. Patient learns recommended strategies on how to positively address this and avoid unhealthy pitfalls.  Facts on Fats  Clinical staff conducted group or individual video education with verbal and written material and guidebook.  Patient learns that lifestyle modifications can be just as effective, if not more so, as many medications for lowering your risk of heart disease. A Pritikin lifestyle can help to reduce your risk of inflammation and atherosclerosis (cholesterol build-up, or plaque, in the artery walls). Lifestyle interventions such as dietary choices and physical activity address the cause of atherosclerosis. A review of the types of fats and their impact on blood cholesterol levels, along with dietary recommendations to reduce fat intake is also included.  Nutrition Action Plan  Clinical staff conducted group or individual video education with verbal and written material and guidebook.  Patient learns how to incorporate Pritikin recommendations into their lifestyle. Recommendations include planning and keeping personal health goals in mind as an important part of their success.  Healthy Mind-Set    Healthy Minds, Bodies, Hearts  Clinical staff conducted group or individual video education with  verbal and written material and guidebook.  Patient learns how to identify when they are stressed. Video will discuss the impact of that stress, as well as the many benefits of stress management. Patient will also be introduced to stress management techniques. The way we think, act, and feel has an impact on our hearts.  How Our Thoughts Can Heal Our Hearts  Clinical staff conducted group or individual video education with verbal and written material and guidebook.  Patient learns that negative thoughts can cause depression and anxiety. This can result in negative lifestyle behavior and serious health problems. Cognitive behavioral therapy is an effective method to help control our thoughts in order to change and improve our emotional outlook.  Additional Videos:  Exercise    Improving Performance  Clinical staff conducted group or individual video education with verbal and written material and guidebook.  Patient learns to use a non-linear approach by alternating intensity levels and lengths of time spent exercising to help burn more calories and lose more body fat. Cardiovascular exercise helps improve heart health,  metabolism, hormonal balance, blood sugar control, and recovery from fatigue. Resistance training improves strength, endurance, balance, coordination, reaction time, metabolism, and muscle mass. Flexibility exercise improves circulation, posture, and balance. Seek guidance from your physician and exercise physiologist before implementing an exercise routine and learn your capabilities and proper form for all exercise.  Introduction to Yoga  Clinical staff conducted group or individual video education with verbal and written material and guidebook.  Patient learns about yoga, a discipline of the coming together of mind, breath, and body. The benefits of yoga include improved flexibility, improved range of motion, better posture and core strength, increased lung function, weight loss, and  positive self-image. Yoga's heart health benefits include lowered blood pressure, healthier heart rate, decreased cholesterol and triglyceride levels, improved immune function, and reduced stress. Seek guidance from your physician and exercise physiologist before implementing an exercise routine and learn your capabilities and proper form for all exercise.  Medical   Aging: Enhancing Your Quality of Life  Clinical staff conducted group or individual video education with verbal and written material and guidebook.  Patient learns key strategies and recommendations to stay in good physical health and enhance quality of life, such as prevention strategies, having an advocate, securing a Health Care Proxy and Power of Attorney, and keeping a list of medications and system for tracking them. It also discusses how to avoid risk for bone loss.  Biology of Weight Control  Clinical staff conducted group or individual video education with verbal and written material and guidebook.  Patient learns that weight gain occurs because we consume more calories than we burn (eating more, moving less). Even if your body weight is normal, you may have higher ratios of fat compared to muscle mass. Too much body fat puts you at increased risk for cardiovascular disease, heart attack, stroke, type 2 diabetes, and obesity-related cancers. In addition to exercise, following the Pritikin Eating Plan can help reduce your risk.  Decoding Lab Results  Clinical staff conducted group or individual video education with verbal and written material and guidebook.  Patient learns that lab test reflects one measurement whose values change over time and are influenced by many factors, including medication, stress, sleep, exercise, food, hydration, pre-existing medical conditions, and more. It is recommended to use the knowledge from this video to become more involved with your lab results and evaluate your numbers to speak with your  doctor.   Diseases of Our Time - Overview  Clinical staff conducted group or individual video education with verbal and written material and guidebook.  Patient learns that according to the CDC, 50% to 70% of chronic diseases (such as obesity, type 2 diabetes, elevated lipids, hypertension, and heart disease) are avoidable through lifestyle improvements including healthier food choices, listening to satiety cues, and increased physical activity.  Sleep Disorders Clinical staff conducted group or individual video education with verbal and written material and guidebook.  Patient learns how good quality and duration of sleep are important to overall health and well-being. Patient also learns about sleep disorders and how they impact health along with recommendations to address them, including discussing with a physician.  Nutrition  Dining Out - Part 2 Clinical staff conducted group or individual video education with verbal and written material and guidebook.  Patient learns how to plan ahead and communicate in order to maximize their dining experience in a healthy and nutritious manner. Included are recommended food choices based on the type of restaurant the patient is visiting.   Fueling  a Healthy Body  Clinical staff conducted group or individual video education with verbal and written material and guidebook.  There is a strong connection between our food choices and our health. Diseases like obesity and type 2 diabetes are very prevalent and are in large-part due to lifestyle choices. The Pritikin Eating Plan provides plenty of food and hunger-curbing satisfaction. It is easy to follow, affordable, and helps reduce health risks.  Menu Workshop  Clinical staff conducted group or individual video education with verbal and written material and guidebook.  Patient learns that restaurant meals can sabotage health goals because they are often packed with calories, fat, sodium, and sugar.  Recommendations include strategies to plan ahead and to communicate with the manager, chef, or server to help order a healthier meal.  Planning Your Eating Strategy  Clinical staff conducted group or individual video education with verbal and written material and guidebook.  Patient learns about the Pritikin Eating Plan and its benefit of reducing the risk of disease. The Pritikin Eating Plan does not focus on calories. Instead, it emphasizes high-quality, nutrient-rich foods. By knowing the characteristics of the foods, we choose, we can determine their calorie density and make informed decisions.  Targeting Your Nutrition Priorities  Clinical staff conducted group or individual video education with verbal and written material and guidebook.  Patient learns that lifestyle habits have a tremendous impact on disease risk and progression. This video provides eating and physical activity recommendations based on your personal health goals, such as reducing LDL cholesterol, losing weight, preventing or controlling type 2 diabetes, and reducing high blood pressure.  Vitamins and Minerals  Clinical staff conducted group or individual video education with verbal and written material and guidebook.  Patient learns different ways to obtain key vitamins and minerals, including through a recommended healthy diet. It is important to discuss all supplements you take with your doctor.   Healthy Mind-Set    Smoking Cessation  Clinical staff conducted group or individual video education with verbal and written material and guidebook.  Patient learns that cigarette smoking and tobacco addiction pose a serious health risk which affects millions of people. Stopping smoking will significantly reduce the risk of heart disease, lung disease, and many forms of cancer. Recommended strategies for quitting are covered, including working with your doctor to develop a successful plan.  Culinary   Becoming a Corporate investment banker conducted group or individual video education with verbal and written material and guidebook.  Patient learns that cooking at home can be healthy, cost-effective, quick, and puts them in control. Keys to cooking healthy recipes will include looking at your recipe, assessing your equipment needs, planning ahead, making it simple, choosing cost-effective seasonal ingredients, and limiting the use of added fats, salts, and sugars.  Cooking - Breakfast and Snacks  Clinical staff conducted group or individual video education with verbal and written material and guidebook.  Patient learns how important breakfast is to satiety and nutrition through the entire day. Recommendations include key foods to eat during breakfast to help stabilize blood sugar levels and to prevent overeating at meals later in the day. Planning ahead is also a key component.  Cooking - Educational psychologist conducted group or individual video education with verbal and written material and guidebook.  Patient learns eating strategies to improve overall health, including an approach to cook more at home. Recommendations include thinking of animal protein as a side on your plate rather than center stage and  focusing instead on lower calorie dense options like vegetables, fruits, whole grains, and plant-based proteins, such as beans. Making sauces in large quantities to freeze for later and leaving the skin on your vegetables are also recommended to maximize your experience.  Cooking - Healthy Salads and Dressing Clinical staff conducted group or individual video education with verbal and written material and guidebook.  Patient learns that vegetables, fruits, whole grains, and legumes are the foundations of the Pritikin Eating Plan. Recommendations include how to incorporate each of these in flavorful and healthy salads, and how to create homemade salad dressings. Proper handling of ingredients is also covered.  Cooking - Soups and [employer] - Soups and Desserts Clinical staff conducted group or individual video education with verbal and written material and guidebook.  Patient learns that Pritikin soups and desserts make for easy, nutritious, and delicious snacks and meal components that are low in sodium, fat, sugar, and calorie density, while high in vitamins, minerals, and filling fiber. Recommendations include simple and healthy ideas for soups and desserts.   Overview     The Pritikin Solution Program Overview Clinical staff conducted group or individual video education with verbal and written material and guidebook.  Patient learns that the results of the Pritikin Program have been documented in more than 100 articles published in peer-reviewed journals, and the benefits include reducing risk factors for (and, in some cases, even reversing) high cholesterol, high blood pressure, type 2 diabetes, obesity, and more! An overview of the three key pillars of the Pritikin Program will be covered: eating well, doing regular exercise, and having a healthy mind-set.  WORKSHOPS  Exercise: Exercise Basics: Building Your Action Plan Clinical staff led group instruction and group discussion with PowerPoint presentation and patient guidebook. To enhance the learning environment the use of posters, models and videos may be added. At the conclusion of this workshop, patients will comprehend the difference between physical activity and exercise, as well as the benefits of incorporating both, into their routine. Patients will understand the FITT (Frequency, Intensity, Time, and Type) principle and how to use it to build an exercise action plan. In addition, safety concerns and other considerations for exercise and cardiac rehab will be addressed by the presenter. The purpose of this lesson is to promote a comprehensive and effective weekly exercise routine in order to improve patients' overall level of  fitness.   Managing Heart Disease: Your Path to a Healthier Heart Clinical staff led group instruction and group discussion with PowerPoint presentation and patient guidebook. To enhance the learning environment the use of posters, models and videos may be added.At the conclusion of this workshop, patients will understand the anatomy and physiology of the heart. Additionally, they will understand how Pritikin's three pillars impact the risk factors, the progression, and the management of heart disease.  The purpose of this lesson is to provide a high-level overview of the heart, heart disease, and how the Pritikin lifestyle positively impacts risk factors.  Exercise Biomechanics Clinical staff led group instruction and group discussion with PowerPoint presentation and patient guidebook. To enhance the learning environment the use of posters, models and videos may be added. Patients will learn how the structural parts of their bodies function and how these functions impact their daily activities, movement, and exercise. Patients will learn how to promote a neutral spine, learn how to manage pain, and identify ways to improve their physical movement in order to promote healthy living. The purpose of this lesson is to  expose patients to common physical limitations that impact physical activity. Participants will learn practical ways to adapt and manage aches and pains, and to minimize their effect on regular exercise. Patients will learn how to maintain good posture while sitting, walking, and lifting.  Balance Training and Fall Prevention  Clinical staff led group instruction and group discussion with PowerPoint presentation and patient guidebook. To enhance the learning environment the use of posters, models and videos may be added. At the conclusion of this workshop, patients will understand the importance of their sensorimotor skills (vision, proprioception, and the vestibular system)  in maintaining their ability to balance as they age. Patients will apply a variety of balancing exercises that are appropriate for their current level of function. Patients will understand the common causes for poor balance, possible solutions to these problems, and ways to modify their physical environment in order to minimize their fall risk. The purpose of this lesson is to teach patients about the importance of maintaining balance as they age and ways to minimize their risk of falling.  WORKSHOPS   Nutrition:  Fueling a Ship broker led group instruction and group discussion with PowerPoint presentation and patient guidebook. To enhance the learning environment the use of posters, models and videos may be added. Patients will review the foundational principles of the Pritikin Eating Plan and understand what constitutes a serving size in each of the food groups. Patients will also learn Pritikin-friendly foods that are better choices when away from home and review make-ahead meal and snack options. Calorie density will be reviewed and applied to three nutrition priorities: weight maintenance, weight loss, and weight gain. The purpose of this lesson is to reinforce (in a group setting) the key concepts around what patients are recommended to eat and how to apply these guidelines when away from home by planning and selecting Pritikin-friendly options. Patients will understand how calorie density may be adjusted for different weight management goals.  Mindful Eating  Clinical staff led group instruction and group discussion with PowerPoint presentation and patient guidebook. To enhance the learning environment the use of posters, models and videos may be added. Patients will briefly review the concepts of the Pritikin Eating Plan and the importance of low-calorie dense foods. The concept of mindful eating will be introduced as well as the importance of paying attention to internal hunger  signals. Triggers for non-hunger eating and techniques for dealing with triggers will be explored. The purpose of this lesson is to provide patients with the opportunity to review the basic principles of the Pritikin Eating Plan, discuss the value of eating mindfully and how to measure internal cues of hunger and fullness using the Hunger Scale. Patients will also discuss reasons for non-hunger eating and learn strategies to use for controlling emotional eating.  Targeting Your Nutrition Priorities Clinical staff led group instruction and group discussion with PowerPoint presentation and patient guidebook. To enhance the learning environment the use of posters, models and videos may be added. Patients will learn how to determine their genetic susceptibility to disease by reviewing their family history. Patients will gain insight into the importance of diet as part of an overall healthy lifestyle in mitigating the impact of genetics and other environmental insults. The purpose of this lesson is to provide patients with the opportunity to assess their personal nutrition priorities by looking at their family history, their own health history and current risk factors. Patients will also be able to discuss ways of prioritizing and modifying the  Pritikin Eating Plan for their highest risk areas  Menu  Clinical staff led group instruction and group discussion with PowerPoint presentation and patient guidebook. To enhance the learning environment the use of posters, models and videos may be added. Using menus brought in from [employer], or printed from [employer], patients will apply the Pritikin dining out guidelines that were presented in the [employer] video. Patients will also be able to practice these guidelines in a variety of provided scenarios. The purpose of this lesson is to provide patients with the opportunity to practice hands-on learning of the Pritikin Dining Out guidelines  with actual menus and practice scenarios.  Label Reading Clinical staff led group instruction and group discussion with PowerPoint presentation and patient guidebook. To enhance the learning environment the use of posters, models and videos may be added. Patients will review and discuss the Pritikin label reading guidelines presented in Pritikin's Label Reading Educational series video. Using fool labels brought in from local grocery stores and markets, patients will apply the label reading guidelines and determine if the packaged food meet the Pritikin guidelines. The purpose of this lesson is to provide patients with the opportunity to review, discuss, and practice hands-on learning of the Pritikin Label Reading guidelines with actual packaged food labels. Cooking School  Pritikin's [employer] are designed to teach patients ways to prepare quick, simple, and affordable recipes at home. The importance of nutrition's role in chronic disease risk reduction is reflected in its emphasis in the overall Pritikin program. By learning how to prepare essential core Pritikin Eating Plan recipes, patients will increase control over what they eat; be able to customize the flavor of foods without the use of added salt, sugar, or fat; and improve the quality of the food they consume. By learning a set of core recipes which are easily assembled, quickly prepared, and affordable, patients are more likely to prepare more healthy foods at home. These workshops focus on convenient breakfasts, simple entres, side dishes, and desserts which can be prepared with minimal effort and are consistent with nutrition recommendations for cardiovascular risk reduction. Cooking [employer] are taught by a Armed forces logistics/support/administrative officer (RD) who has been trained by the [employer]. The chef or RD has a clear understanding of the importance of minimizing - if not completely eliminating - added fat, sugar, and  sodium in recipes. Throughout the series of Cooking School Workshop sessions, patients will learn about healthy ingredients and efficient methods of cooking to build confidence in their capability to prepare    Cooking School weekly topics:  Adding Flavor- Sodium-Free  Fast and Healthy Breakfasts  Powerhouse Plant-Based Proteins  Satisfying Salads and Dressings  Simple Sides and Sauces  International Cuisine-Spotlight on the [employer] Zones  Delicious Desserts  Savory Soups  [employer] - Meals in a Astronomer Appetizers and Snacks  Comforting Weekend Breakfasts  One-Pot Wonders   Fast Evening Meals  Landscape architect Your Pritikin Plate  WORKSHOPS   Healthy Mindset (Psychosocial):  Focused Goals, Sustainable Changes Clinical staff led group instruction and group discussion with PowerPoint presentation and patient guidebook. To enhance the learning environment the use of posters, models and videos may be added. Patients will be able to apply effective goal setting strategies to establish at least one personal goal, and then take consistent, meaningful action toward that goal. They will learn to identify common barriers to achieving personal goals and develop strategies to overcome them.  Patients will also gain an understanding of how our mind-set can impact our ability to achieve goals and the importance of cultivating a positive and growth-oriented mind-set. The purpose of this lesson is to provide patients with a deeper understanding of how to set and achieve personal goals, as well as the tools and strategies needed to overcome common obstacles which may arise along the way.  From Head to Heart: The Power of a Healthy Outlook  Clinical staff led group instruction and group discussion with PowerPoint presentation and patient guidebook. To enhance the learning environment the use of posters, models and videos may be added. Patients will be able to recognize and  describe the impact of emotions and mood on physical health. They will discover the importance of self-care and explore self-care practices which may work for them. Patients will also learn how to utilize the 4 C's to cultivate a healthier outlook and better manage stress and challenges. The purpose of this lesson is to demonstrate to patients how a healthy outlook is an essential part of maintaining good health, especially as they continue their cardiac rehab journey.  Healthy Sleep for a Healthy Heart Clinical staff led group instruction and group discussion with PowerPoint presentation and patient guidebook. To enhance the learning environment the use of posters, models and videos may be added. At the conclusion of this workshop, patients will be able to demonstrate knowledge of the importance of sleep to overall health, well-being, and quality of life. They will understand the symptoms of, and treatments for, common sleep disorders. Patients will also be able to identify daytime and nighttime behaviors which impact sleep, and they will be able to apply these tools to help manage sleep-related challenges. The purpose of this lesson is to provide patients with a general overview of sleep and outline the importance of quality sleep. Patients will learn about a few of the most common sleep disorders. Patients will also be introduced to the concept of "sleep hygiene," and discover ways to self-manage certain sleeping problems through simple daily behavior changes. Finally, the workshop will motivate patients by clarifying the links between quality sleep and their goals of heart-healthy living.   Recognizing and Reducing Stress Clinical staff led group instruction and group discussion with PowerPoint presentation and patient guidebook. To enhance the learning environment the use of posters, models and videos may be added. At the conclusion of this workshop, patients will be able to understand the types of stress  reactions, differentiate between acute and chronic stress, and recognize the impact that chronic stress has on their health. They will also be able to apply different coping mechanisms, such as reframing negative self-talk. Patients will have the opportunity to practice a variety of stress management techniques, such as deep abdominal breathing, progressive muscle relaxation, and/or guided imagery.  The purpose of this lesson is to educate patients on the role of stress in their lives and to provide healthy techniques for coping with it.  Learning Barriers/Preferences:  Learning Barriers/Preferences - 09/30/23 1010       Learning Barriers/Preferences   Learning Barriers Sight   wears glasses   Learning Preferences Audio;Computer/Internet;Group Instruction;Individual Instruction;Pictoral;Skilled Demonstration;Verbal Instruction;Video;Written Material             Education Topics:  Knowledge Questionnaire Score:  Knowledge Questionnaire Score - 09/30/23 1011       Knowledge Questionnaire Score   Pre Score 21/24             Core Components/Risk Factors/Patient Goals at Admission:  Personal Goals and Risk Factors at Admission - 09/30/23 1011       Core Components/Risk Factors/Patient Goals on Admission    Weight Management Yes;Obesity;Weight Loss    Intervention Weight Management: Develop a combined nutrition and exercise program designed to reach desired caloric intake, while maintaining appropriate intake of nutrient and fiber, sodium and fats, and appropriate energy expenditure required for the weight goal.;Weight Management: Provide education and appropriate resources to help participant work on and attain dietary goals.;Weight Management/Obesity: Establish reasonable short term and long term weight goals.;Obesity: Provide education and appropriate resources to help participant work on and attain dietary goals.    Expected Outcomes Short Term: Continue to assess and modify  interventions until short term weight is achieved;Long Term: Adherence to nutrition and physical activity/exercise program aimed toward attainment of established weight goal;Weight Loss: Understanding of general recommendations for a balanced deficit meal plan, which promotes 1-2 lb weight loss per week and includes a negative energy balance of [phone] kcal/d;Understanding recommendations for meals to include 15-35% energy as protein, 25-35% energy from fat, 35-60% energy from carbohydrates, less than 200mg  of dietary cholesterol, 20-35 gm of total fiber daily;Understanding of distribution of calorie intake throughout the day with the consumption of 4-5 meals/snacks    Diabetes Yes    Intervention Provide education about signs/symptoms and action to take for hypo/hyperglycemia.;Provide education about proper nutrition, including hydration, and aerobic/resistive exercise prescription along with prescribed medications to achieve blood glucose in normal ranges: Fasting glucose 65-99 mg/dL    Expected Outcomes Short Term: Participant verbalizes understanding of the signs/symptoms and immediate care of hyper/hypoglycemia, proper foot care and importance of medication, aerobic/resistive exercise and nutrition plan for blood glucose control.;Long Term: Attainment of HbA1C < 7%.    Hypertension Yes    Intervention Provide education on lifestyle modifcations including regular physical activity/exercise, weight management, moderate sodium restriction and increased consumption of fresh fruit, vegetables, and low fat dairy, alcohol moderation, and smoking cessation.;Monitor prescription use compliance.    Expected Outcomes Short Term: Continued assessment and intervention until BP is < 140/74mm HG in hypertensive participants. < 130/24mm HG in hypertensive participants with diabetes, heart failure or chronic kidney disease.;Long Term: Maintenance of blood pressure at goal levels.    Lipids Yes    Intervention Provide  education and support for participant on nutrition & aerobic/resistive exercise along with prescribed medications to achieve LDL 70mg , HDL >40mg .    Expected Outcomes Short Term: Participant states understanding of desired cholesterol values and is compliant with medications prescribed. Participant is following exercise prescription and nutrition guidelines.;Long Term: Cholesterol controlled with medications as prescribed, with individualized exercise RX and with personalized nutrition plan. Value goals: LDL < 70mg , HDL > 40 mg.             Core Components/Risk Factors/Patient Goals Review:   Goals and Risk Factor Review     Row Name 10/12/23 0809 10/28/23 0848           Core Components/Risk Factors/Patient Goals Review   Personal Goals Review Weight Management/Obesity;Hypertension;Lipids;Diabetes Weight Management/Obesity;Hypertension;Lipids;Diabetes      Review Tasha started cardiac rehab on 10/08/23. Cyruss did well with exercise. Vital signs and CBG's were stable Romond has been doing  well with exercise. Vital signs and CBG's remain stable. Damere has lost 2.7 kg since starting cardiac rehab,      Expected Outcomes Sachit will continue to particpate in cardiac rehab for exercise, nutrition and lifestyle modifications. Derwin will continue to particpate in cardiac rehab for exercise,  nutrition and lifestyle modifications.               Core Components/Risk Factors/Patient Goals at Discharge (Final Review):   Goals and Risk Factor Review - 10/28/23 0848       Core Components/Risk Factors/Patient Goals Review   Personal Goals Review Weight Management/Obesity;Hypertension;Lipids;Diabetes    Review Moo has been doing  well with exercise. Vital signs and CBG's remain stable. Tijuan has lost 2.7 kg since starting cardiac rehab,    Expected Outcomes Brandyn will continue to particpate in cardiac rehab for exercise, nutrition and lifestyle modifications.             ITP Comments:  ITP  Comments     Row Name 09/30/23 0940 10/06/23 0858 10/12/23 0804 10/28/23 0842     ITP Comments Armanda Magic, MD: Medical Director.  Introduction to the Pritikin Education Program/Intensive Cardiac Rehab.  Initial orientation packet reviewed with the patient. 30 Day ITP Review. Kyvon will tenatively start exercise on 10/08/23. 30 Day ITP Review. Pharrell  started cardiac rehab on 10/08/23. Ishmail did well with exercise. 30 Day ITP Review. Wilson  has good participation when in attendance at  cardiac rehab             Comments: See ITP comments.Thayer Headings RN BSN

## 2023-10-29 ENCOUNTER — Encounter (HOSPITAL_COMMUNITY)
Admission: RE | Admit: 2023-10-29 | Discharge: 2023-10-29 | Disposition: A | Discharge status: Home / Self Care | Payer: PPO | Source: Ambulatory Visit | Attending: Internal Medicine | Admitting: Internal Medicine

## 2023-10-29 DIAGNOSIS — Z48812 Encounter for surgical aftercare following surgery on the circulatory system: Secondary | ICD-10-CM | POA: Diagnosis not present

## 2023-10-29 DIAGNOSIS — Z955 Presence of coronary angioplasty implant and graft: Secondary | ICD-10-CM

## 2023-11-01 ENCOUNTER — Encounter (HOSPITAL_COMMUNITY)
Admission: RE | Admit: 2023-11-01 | Discharge: 2023-11-01 | Disposition: A | Discharge status: Home / Self Care | Payer: PPO | Source: Ambulatory Visit | Attending: Internal Medicine | Admitting: Internal Medicine

## 2023-11-01 DIAGNOSIS — Z48812 Encounter for surgical aftercare following surgery on the circulatory system: Secondary | ICD-10-CM | POA: Diagnosis not present

## 2023-11-01 DIAGNOSIS — Z955 Presence of coronary angioplasty implant and graft: Secondary | ICD-10-CM

## 2023-11-03 ENCOUNTER — Encounter (HOSPITAL_COMMUNITY)
Admission: RE | Admit: 2023-11-03 | Discharge: 2023-11-03 | Disposition: A | Discharge status: Home / Self Care | Payer: PPO | Source: Ambulatory Visit | Attending: Internal Medicine | Admitting: Internal Medicine

## 2023-11-03 DIAGNOSIS — Z48812 Encounter for surgical aftercare following surgery on the circulatory system: Secondary | ICD-10-CM | POA: Diagnosis not present

## 2023-11-03 DIAGNOSIS — Z955 Presence of coronary angioplasty implant and graft: Secondary | ICD-10-CM

## 2023-11-05 ENCOUNTER — Encounter (HOSPITAL_COMMUNITY)
Admission: RE | Admit: 2023-11-05 | Discharge: 2023-11-05 | Disposition: A | Discharge status: Home / Self Care | Payer: PPO | Source: Ambulatory Visit | Attending: Internal Medicine | Admitting: Internal Medicine

## 2023-11-05 DIAGNOSIS — Z48812 Encounter for surgical aftercare following surgery on the circulatory system: Secondary | ICD-10-CM | POA: Diagnosis not present

## 2023-11-05 DIAGNOSIS — Z955 Presence of coronary angioplasty implant and graft: Secondary | ICD-10-CM

## 2023-11-08 ENCOUNTER — Encounter (HOSPITAL_COMMUNITY)
Admission: RE | Admit: 2023-11-08 | Discharge: 2023-11-08 | Disposition: A | Discharge status: Home / Self Care | Payer: PPO | Source: Ambulatory Visit | Attending: Internal Medicine

## 2023-11-08 DIAGNOSIS — Z955 Presence of coronary angioplasty implant and graft: Secondary | ICD-10-CM

## 2023-11-08 DIAGNOSIS — Z48812 Encounter for surgical aftercare following surgery on the circulatory system: Secondary | ICD-10-CM | POA: Diagnosis not present

## 2023-11-12 ENCOUNTER — Telehealth (HOSPITAL_COMMUNITY): Payer: Self-pay

## 2023-11-12 ENCOUNTER — Telehealth: Payer: Self-pay | Admitting: Internal Medicine

## 2023-11-12 ENCOUNTER — Encounter (HOSPITAL_COMMUNITY)
Admission: RE | Admit: 2023-11-12 | Discharge: 2023-11-12 | Disposition: A | Discharge status: Home / Self Care | Payer: PPO | Source: Ambulatory Visit | Attending: Internal Medicine

## 2023-11-12 DIAGNOSIS — Z955 Presence of coronary angioplasty implant and graft: Secondary | ICD-10-CM

## 2023-11-12 DIAGNOSIS — Z48812 Encounter for surgical aftercare following surgery on the circulatory system: Secondary | ICD-10-CM | POA: Diagnosis not present

## 2023-11-12 NOTE — Progress Notes (Signed)
Reviewed home exercise Rx with patient today.  Encouraged warm-up, cool-down, and stretching. Reviewed THRR of 62-124 and keeping RPE between 11-13. Encouraged to hydrate with activity.  Reviewed weather parameters for temperature and humidity for safe exercise outdoors. Reviewed S/S to terminate exercise and when to call 911 vs MD. Reviewed the use of NTG and pt was encouraged to carry at all times. Pt encouraged to know his blood sugar before exercise and carry glucose tabs for safety. Pt encouraged to always carry a cell phone for safety when exercising outdoors. Pt verbalized understanding of the home exercise Rx and was provided a copy.   Lorin Picket MS, ACSM-CEP, CCRP

## 2023-11-12 NOTE — Telephone Encounter (Signed)
Parke Poisson, MD  You; Morrell Riddle, RNJust now (4:27 PM)    I responded to his request. Thanks.    Noted  No further nursing outreach needed at this time

## 2023-11-12 NOTE — Telephone Encounter (Signed)
-----   Message from Parke Poisson sent at 11/12/2023  4:26 PM EST ----- Regarding: RE: THR increase for Patrick Cooley (22-Jul-1956) OK to increase HR goal ----- Message ----- From: Lorin Picket Sent: 11/04/2023   8:46 AM EST To: Cammy Copa, RN; Parke Poisson, MD Subject: THR increase for Patrick Cooley (11/05/1956)      Good morning Dr. Jacques Navy,  I am requesting an increase in [facility] for you patient Patrick Cooley. He exceeds his THRR [phone]) and wants to work harder. I am requesting an increase to 90% (41-324) if you are in agreement.  Blood pressures have ranged with exercise: 122-142/62-80. Please let me know if you require any additional information. His sessions noted are available under chart review-Media.  Best Regards,  Lorin Picket MS, ACSM-CEP, CCRP

## 2023-11-12 NOTE — Telephone Encounter (Signed)
Caller Onalee Hua) stated he sent a request to increase target HR as patient has been exceeding current HR.

## 2023-11-12 NOTE — Telephone Encounter (Deleted)
-----   Message from Parke Poisson sent at 11/12/2023  4:26 PM EST ----- Regarding: RE: THR increase for Feliberto Geigel (22-Jul-1956) OK to increase HR goal ----- Message ----- From: Lorin Picket Sent: 11/04/2023   8:46 AM EST To: Cammy Copa, RN; Parke Poisson, MD Subject: THR increase for Neldon Labella (11/05/1956)      Good morning Dr. Jacques Navy,  I am requesting an increase in [facility] for you patient Willian Casares. He exceeds his THRR [phone]) and wants to work harder. I am requesting an increase to 90% (41-324) if you are in agreement.  Blood pressures have ranged with exercise: 122-142/62-80. Please let me know if you require any additional information. His sessions noted are available under chart review-Media.  Best Regards,  Lorin Picket MS, ACSM-CEP, CCRP

## 2023-11-15 ENCOUNTER — Encounter (HOSPITAL_COMMUNITY)
Admission: RE | Admit: 2023-11-15 | Discharge: 2023-11-15 | Disposition: A | Discharge status: Home / Self Care | Payer: PPO | Source: Ambulatory Visit | Attending: Internal Medicine | Admitting: Internal Medicine

## 2023-11-15 DIAGNOSIS — Z955 Presence of coronary angioplasty implant and graft: Secondary | ICD-10-CM

## 2023-11-15 DIAGNOSIS — Z48812 Encounter for surgical aftercare following surgery on the circulatory system: Secondary | ICD-10-CM | POA: Diagnosis not present

## 2023-11-19 ENCOUNTER — Encounter (HOSPITAL_COMMUNITY)
Admission: RE | Admit: 2023-11-19 | Discharge: 2023-11-19 | Disposition: A | Discharge status: Home / Self Care | Payer: PPO | Source: Ambulatory Visit | Attending: Internal Medicine | Admitting: Internal Medicine

## 2023-11-19 DIAGNOSIS — Z955 Presence of coronary angioplasty implant and graft: Secondary | ICD-10-CM | POA: Insufficient documentation

## 2023-11-19 DIAGNOSIS — Z48812 Encounter for surgical aftercare following surgery on the circulatory system: Secondary | ICD-10-CM | POA: Diagnosis not present

## 2023-11-22 ENCOUNTER — Encounter (HOSPITAL_COMMUNITY)
Admission: RE | Admit: 2023-11-22 | Discharge: 2023-11-22 | Disposition: A | Discharge status: Home / Self Care | Payer: PPO | Source: Ambulatory Visit | Attending: Internal Medicine | Admitting: Internal Medicine

## 2023-11-22 DIAGNOSIS — Z955 Presence of coronary angioplasty implant and graft: Secondary | ICD-10-CM | POA: Diagnosis not present

## 2023-11-22 NOTE — Progress Notes (Signed)
 Cardiac Individual Treatment Plan  Patient Details  Name: Patrick Cooley MRN: [id number] Date of Birth: 11-Apr-1956 Referring Provider:   Flowsheet Row INTENSIVE CARDIAC REHAB ORIENT from 09/30/2023 in [facility] for Heart, Vascular, & Lung Health  Referring Provider Soyla Merck, MD       Initial Encounter Date:  Flowsheet Row INTENSIVE CARDIAC REHAB ORIENT from 09/30/2023 in [facility] for Heart, Vascular, & Lung Health  Date 09/30/23       Visit Diagnosis: 09/08/23 Status post coronary artery stent placement  Patient's Home Medications on Admission:  Current Outpatient Medications:    alfuzosin (UROXATRAL) 10 MG 24 hr tablet, Take 10 mg by mouth at bedtime., Disp: , Rfl:    aspirin  EC 81 MG tablet, Take 81 mg by mouth at bedtime. Swallow whole., Disp: , Rfl:    atorvastatin  (LIPITOR) 80 MG tablet, Take 1 tablet (80 mg total) by mouth every morning., Disp: 90 tablet, Rfl: 1   cetirizine (ZYRTEC) 10 MG tablet, Take 10 mg by mouth every morning., Disp: , Rfl:    clopidogrel  (PLAVIX ) 75 MG tablet, Take 1 tablet (75 mg total) by mouth daily with breakfast., Disp: 90 tablet, Rfl: 1   cyanocobalamin  (VITAMIN B12) 1000 MCG tablet, Take 1,000 mcg by mouth at bedtime., Disp: , Rfl:    dapagliflozin propanediol (FARXIGA) 10 MG TABS tablet, Take 10 mg by mouth every morning., Disp: , Rfl:    ferrous sulfate 325 (65 FE) MG tablet, Take 325 mg by mouth at bedtime., Disp: , Rfl:    fluticasone (FLONASE) 50 MCG/ACT nasal spray, Place 2 sprays into both nostrils 2 (two) times daily., Disp: , Rfl:    ipratropium (ATROVENT) 0.03 % nasal spray, Place 2 sprays into both nostrils 4 (four) times daily., Disp: , Rfl:    losartan  (COZAAR ) 25 MG tablet, Take 1 tablet (25 mg total) by mouth daily., Disp: 90 tablet, Rfl: 3   metFORMIN (GLUCOPHAGE) 1000 MG tablet, Take 1,000 mg by mouth 2 (two) times daily with a meal., Disp: , Rfl:    montelukast  (SINGULAIR) 10 MG tablet, Take 10 mg by mouth at bedtime., Disp: , Rfl:    Multiple Vitamins-Minerals (MULTIVITAMIN WITH MINERALS) tablet, Take 1 tablet by mouth every morning., Disp: , Rfl:    naphazoline-glycerin (CLEAR EYES REDNESS) 0.012-0.25 % SOLN, Place 1-2 drops into both eyes 4 (four) times daily as needed for eye irritation., Disp: , Rfl:    nitroGLYCERIN  (NITROSTAT ) 0.4 MG SL tablet, Place 1 tablet (0.4 mg total) under the tongue every 5 (five) minutes as needed for chest pain., Disp: 25 tablet, Rfl: 3   pantoprazole  (PROTONIX ) 40 MG tablet, Take 1 tablet (40 mg total) by mouth daily., Disp: 90 tablet, Rfl: 3   polycarbophil (FIBERCON) 625 MG tablet, Take 1,250 mg by mouth 2 (two) times daily., Disp: , Rfl:    sertraline (ZOLOFT) 50 MG tablet, Take 50 mg by mouth every morning., Disp: , Rfl:    sucralfate (CARAFATE) 1 g tablet, Take 1 g by mouth daily as needed (Stomach acid)., Disp: , Rfl:    tadalafil (CIALIS) 5 MG tablet, Take 5 mg by mouth at bedtime., Disp: , Rfl:    tirzepatide (MOUNJARO) 10 MG/0.5ML Pen, Inject 10 mg into the skin once a week., Disp: , Rfl:   Past Medical History: Past Medical History:  Diagnosis Date   Acute torn meniscus of knee, right, sequela    loose bodies also   Allergic  rhinitis    Anxiety    Benign localized prostatic hyperplasia with lower urinary tract symptoms (LUTS)    urologist--- dr [email or web address]. evans   Bladder outlet obstruction    DM type 2 (diabetes mellitus, type 2) (HCC)    followed by pcp   (08-07-2021 per pt checks blood dialy in am,  fasting sugar-- 114--125)   GERD (gastroesophageal reflux disease)    History of COVID-19 05/2021   per pt positive home test , mild to moderate symptoms,  all symptoms resolved with exception occasional dry cough but could be from allergies   Hyperlipemia    IDA (iron deficiency anemia)    on iron po   OA (osteoarthritis)    OSA on CPAP    PER PT USES NIGHTLY   Tightness in chest    occ with exertion  riding mountain bike;  pt referred to cardiology for evaluation ,  dr g. loni , lov note in epic 07-15-2021 (nuclear stress test 06-02-2021 (epic) showed low risk with no ischemia, nuclear ef 50%, hypertensive response with exercise) and echo in care everywhere 07-05-2020 ef 55-60% mild MR   Wears glasses     Tobacco Use: Social History   Tobacco Use  Smoking Status Never  Smokeless Tobacco Never    Labs: Review Flowsheet       06/02/2023  Labs for ITP Cardiac and Pulmonary Rehab  Hemoglobin A1c 6.7        Details       This result is from an external source.         Capillary Blood Glucose: Lab Results  Component Value Date   GLUCAP 211 (H) 10/18/2023   GLUCAP 137 (H) 10/13/2023   GLUCAP 247 (H) 10/13/2023   GLUCAP 198 (H) 10/08/2023   GLUCAP 188 (H) 09/08/2023     Exercise Target Goals: Exercise Program Goal: Individual exercise prescription set using results from initial 6 min walk test and THRR while considering  patient's activity barriers and safety.   Exercise Prescription Goal: Initial exercise prescription builds to 30-45 minutes a day of aerobic activity, 2-3 days per week.  Home exercise guidelines will be given to patient during program as part of exercise prescription that the participant will acknowledge.  Activity Barriers & Risk Stratification:  Activity Barriers & Cardiac Risk Stratification - 09/30/23 1009       Activity Barriers & Cardiac Risk Stratification   Activity Barriers Arthritis;Joint Problems    Cardiac Risk Stratification High   <5 METs on            6 Minute Walk:  6 Minute Walk     Row Name 09/30/23 1106         6 Minute Walk   Phase Initial     Distance 2040 feet     Walk Time 6 minutes     # of Rest Breaks 0     MPH 3.86     METS 4.38     RPE 9     Perceived Dyspnea  0     VO2 Peak 15.31     Symptoms No     Resting HR 80 bpm     Resting BP 96/58  recheck 112/60     Resting Oxygen Saturation  94 %      Exercise Oxygen Saturation  during 6 min walk 97 %     Max Ex. HR 121 bpm     Max Ex. BP 130/66  2 Minute Post BP 108/64              Oxygen Initial Assessment:   Oxygen Re-Evaluation:   Oxygen Discharge (Final Oxygen Re-Evaluation):   Initial Exercise Prescription:  Initial Exercise Prescription - 09/30/23 1100       Date of Initial Exercise RX and Referring Provider   Date 09/30/23    Referring Provider Soyla Merck, MD    Expected Discharge Date 12/22/23      Bike   Level 1    Watts 50    Minutes 15    METs 3      Recumbant Elliptical   Level 1    RPM 75    Watts 90    Minutes 15    METs 3      Prescription Details   Frequency (times per week) 3    Duration Progress to 30 minutes of continuous aerobic without signs/symptoms of physical distress      Intensity   THRR 40-80% of Max Heartrate 62-124    Ratings of Perceived Exertion 11-13    Perceived Dyspnea 0-4      Progression   Progression Continue progressive overload as per policy without signs/symptoms or physical distress.      Resistance Training   Training Prescription Yes    Weight 3    Reps 10-15             Perform Capillary Blood Glucose checks as needed.  Exercise Prescription Changes:   Exercise Prescription Changes     Row Name 10/08/23 1646 10/25/23 1700 11/08/23 1500 11/12/23 1600 11/22/23 1400     Response to Exercise   Blood Pressure (Admit) 116/64 126/58 124/72 110/60 106/58   Blood Pressure (Exercise) 138/78 134/62 -- -- --   Blood Pressure (Exit) 110/68 108/62 98/60 102/56 104/72   Heart Rate (Admit) 72 bpm 93 bpm 102 bpm 98 bpm 78 bpm   Heart Rate (Exercise) 133 bpm 124 bpm 144 bpm 135 bpm 143 bpm   Heart Rate (Exit) 79 bpm 97 bpm 111 bpm 106 bpm 86 bpm   Rating of Perceived Exertion (Exercise) 10 12 11 11 12    Perceived Dyspnea (Exercise) 0 -- -- -- --   Symptoms 0 -- -- None None   Comments Pt first day in the [facility] I[facility] program Reviewed METs  Reviewed METs and goals Reviewed HERx Reviewed METs   Duration Progress to 30 minutes of  aerobic without signs/symptoms of physical distress Continue with 30 min of aerobic exercise without signs/symptoms of physical distress. Continue with 30 min of aerobic exercise without signs/symptoms of physical distress. Continue with 30 min of aerobic exercise without signs/symptoms of physical distress. Continue with 30 min of aerobic exercise without signs/symptoms of physical distress.   Intensity THRR unchanged THRR unchanged THRR unchanged THRR unchanged THRR unchanged     Progression   Progression Continue to progress workloads to maintain intensity without signs/symptoms of physical distress. Continue to progress workloads to maintain intensity without signs/symptoms of physical distress. Continue to progress workloads to maintain intensity without signs/symptoms of physical distress. Continue to progress workloads to maintain intensity without signs/symptoms of physical distress. Continue to progress workloads to maintain intensity without signs/symptoms of physical distress.   Average METs 4.15 4.7 4.9 4.35 5.35     Resistance Training   Training Prescription Yes Yes Yes Yes Yes   Weight 3 3 lbs 3 lbs 4 lbs 5 lbs   Reps 10-15 10-15 10-15 10-15 10-15  Time 10 Minutes 10 Minutes 10 Minutes 10 Minutes 10 Minutes     Interval Training   Interval Training -- No No No No     Bike   Level 1 4 4 4  4.5   Watts 33 71 55 48 74   Minutes 15 15 15 15 15    METs 3.1 4.5 3.9 3.7 4.7     Recumbant Elliptical   Level 1 5 7 8 8    RPM 92 58 71 61 73   Watts 128 91 123 112 140   Minutes 15 15 15 15 15    METs 5.2 4.9 5.5 5 6      Home Exercise Plan   Plans to continue exercise at -- -- -- [employer] (comment) [employer] (comment)   Frequency -- -- -- Add 2 additional days to program exercise sessions. Add 2 additional days to program exercise sessions.   Initial Home Exercises Provided --  -- -- 11/12/23 11/12/23            Exercise Comments:   Exercise Comments     Row Name 10/08/23 1428 10/25/23 1712 11/08/23 1553 11/12/23 1638 11/22/23 1410   Exercise Comments Pt's first day in the [facility]2 program. Pt exercised without complaints. Reviewed METs. Pt is making progressing and increasing workloads. Reviewed METs and goals. Pt exceeds THR with exercise and we request for the patient to reduce workloads. I have a pending request with Dr, Acharya, for an increase in the [facility]. Reviewed home exercise Rx. P{t would like to start to exercise at the [facility] on his off days as his wife just finished pulmonary rehab and he would like to exercise with her. Pt will use bike or other seated machine , as well as resistance training. Pt verbalized understanding of the HERx and was provied a copy. Reviewed METs. Pt making good progress. Increased on both modalities today.            Exercise Goals and Review:   Exercise Goals     Row Name 09/30/23 0942             Exercise Goals   Increase Physical Activity Yes       Intervention Provide advice, education, support and counseling about physical activity/exercise needs.;Develop an individualized exercise prescription for aerobic and resistive training based on initial evaluation findings, risk stratification, comorbidities and participant's personal goals.       Expected Outcomes Short Term: Attend rehab on a regular basis to increase amount of physical activity.;Long Term: Exercising regularly at least 3-5 days a week.;Long Term: Add in home exercise to make exercise part of routine and to increase amount of physical activity.       Increase Strength and Stamina Yes       Intervention Provide advice, education, support and counseling about physical activity/exercise needs.;Develop an individualized exercise prescription for aerobic and resistive training based on initial evaluation findings, risk stratification, comorbidities and  participant's personal goals.       Expected Outcomes Short Term: Increase workloads from initial exercise prescription for resistance, speed, and METs.;Short Term: Perform resistance training exercises routinely during rehab and add in resistance training at home;Long Term: Improve cardiorespiratory fitness, muscular endurance and strength as measured by increased METs and functional capacity ( )       Able to understand and use rate of perceived exertion (RPE) scale Yes       Intervention Provide education and explanation on how to use RPE scale  Expected Outcomes Short Term: Able to use RPE daily in rehab to express subjective intensity level;Long Term:  Able to use RPE to guide intensity level when exercising independently       Knowledge and understanding of Target Heart Rate Range (THRR) Yes       Intervention Provide education and explanation of THRR including how the numbers were predicted and where they are located for reference       Expected Outcomes Short Term: Able to state/look up THRR;Long Term: Able to use THRR to govern intensity when exercising independently;Short Term: Able to use daily as guideline for intensity in rehab       Understanding of Exercise Prescription Yes       Intervention Provide education, explanation, and written materials on patient's individual exercise prescription       Expected Outcomes Short Term: Able to explain program exercise prescription;Long Term: Able to explain home exercise prescription to exercise independently                Exercise Goals Re-Evaluation :  Exercise Goals Re-Evaluation     Row Name 10/08/23 1427 11/08/23 1549           Exercise Goal Re-Evaluation   Exercise Goals Review Increase Physical Activity;Understanding of Exercise Prescription;Increase Strength and Stamina;Knowledge and understanding of Target Heart Rate Range (THRR);Able to understand and use rate of perceived exertion (RPE) scale Increase Physical  Activity;Understanding of Exercise Prescription;Increase Strength and Stamina;Knowledge and understanding of Target Heart Rate Range (THRR);Able to understand and use rate of perceived exertion (RPE) scale      Comments Pt's first day in the CRP2 program. Pt understnads the  exercise RX, THRR, and RPE scale. Reviewed METs and goals. Pt voices some progress on his goal of increased strength and stamina. His goal to build overall fitness is a work in progress.      Expected Outcomes Will continute to montior patient and progress exercise workloads as tolerated. Will continute to montior patient and progress exercise workloads as tolerated.               Discharge Exercise Prescription (Final Exercise Prescription Changes):  Exercise Prescription Changes - 11/22/23 1400       Response to Exercise   Blood Pressure (Admit) 106/58    Blood Pressure (Exit) 104/72    Heart Rate (Admit) 78 bpm    Heart Rate (Exercise) 143 bpm    Heart Rate (Exit) 86 bpm    Rating of Perceived Exertion (Exercise) 12    Symptoms None    Comments Reviewed METs    Duration Continue with 30 min of aerobic exercise without signs/symptoms of physical distress.    Intensity THRR unchanged      Progression   Progression Continue to progress workloads to maintain intensity without signs/symptoms of physical distress.    Average METs 5.35      Resistance Training   Training Prescription Yes    Weight 5 lbs    Reps 10-15    Time 10 Minutes      Interval Training   Interval Training No      Bike   Level 4.5    Watts 74    Minutes 15    METs 4.7      Recumbant Elliptical   Level 8    RPM 73    Watts 140    Minutes 15    METs 6      Home Exercise Plan   Plans to  continue exercise at [employer] (comment)    Frequency Add 2 additional days to program exercise sessions.    Initial Home Exercises Provided 11/12/23             Nutrition:  Target Goals: Understanding of nutrition guidelines,  daily intake of sodium 1500mg , cholesterol 200mg , calories 30% from fat and 7% or less from saturated fats, daily to have 5 or more servings of fruits and vegetables.  Biometrics:  Pre Biometrics - 09/30/23 0939       Pre Biometrics   Waist Circumference 45 inches    Hip Circumference 435.5 inches    Waist to Hip Ratio 0.1 %    Triceps Skinfold 9 mm    % Body Fat 29.7 %    Grip Strength 38 kg    Flexibility 11 in    Single Leg Stand 30 seconds              Nutrition Therapy Plan and Nutrition Goals:  Nutrition Therapy & Goals - 10/08/23 1407       Nutrition Therapy   Diet Heart Healthy Diet    Drug/Food Interactions Statins/Certain Fruits      Personal Nutrition Goals   Nutrition Goal Patient to identify strategies for reducing cardiovascular risk by attending the [facility] education and nutrition series weekly.    Personal Goal #2 Patient to improve diet quality by using the plate method as a guide for meal planning to include lean protein/plant protein, fruits, vegetables, whole grains, nonfat dairy as part of a well-balanced diet.    Personal Goal #3 Patient to limit sodium to 2300mg  per day    Comments Avery has medical history of CAD, Hyperlipidemia, DM2, OSA, HTN, s/p coronary artery stent. He is taking mounjaro to aid with weight loss, blood sugar control. LDL remains at goal <70 (65). His wife is motivated to make lifestyle changes as well. Patient will benefit from participation in intensive cardiac rehab for nutrition, exercise, and lifestyle modification.      Intervention Plan   Intervention Prescribe, educate and counsel regarding individualized specific dietary modifications aiming towards targeted core components such as weight, hypertension, lipid management, diabetes, heart failure and other comorbidities.;Nutrition handout(s) given to patient.    Expected Outcomes Short Term Goal: Understand basic principles of dietary content, such as calories, fat, sodium,  cholesterol and nutrients.;Long Term Goal: Adherence to prescribed nutrition plan.             Nutrition Assessments:  MEDIFICTS Score Key: >=70 Need to make dietary changes  40-70 Heart Healthy Diet <= 40 Therapeutic Level Cholesterol Diet   Flowsheet Row INTENSIVE CARDIAC REHAB from 10/27/2023 in [facility] for Heart, Vascular, & Lung Health  Picture Your Plate Total Score on Admission 50      Picture Your Plate Scores: <59 Unhealthy dietary pattern with much room for improvement. 41-50 Dietary pattern unlikely to meet recommendations for good health and room for improvement. 51-60 More healthful dietary pattern, with some room for improvement.  >60 Healthy dietary pattern, although there may be some specific behaviors that could be improved.    Nutrition Goals Re-Evaluation:  Nutrition Goals Re-Evaluation     Row Name 10/08/23 1407             Goals   Current Weight 199 lb 8.3 oz (90.5 kg)       Comment LDL 65, triglycerides 154, lipoproteinA WNL, A1c 6.7       Expected Outcome Kenny has  medical history of CAD, Hyperlipidemia, DM2, OSA, HTN, s/p coronary artery stent. He is taking mounjaro to aid with weight loss, blood sugar control. LDL remains at goal <70 (65). His wife is motivated to make lifestyle changes as well. Patient will benefit from participation in intensive cardiac rehab for nutrition, exercise, and lifestyle modification.                Nutrition Goals Re-Evaluation:  Nutrition Goals Re-Evaluation     Row Name 10/08/23 1407             Goals   Current Weight 199 lb 8.3 oz (90.5 kg)       Comment LDL 65, triglycerides 154, lipoproteinA WNL, A1c 6.7       Expected Outcome Kysen has medical history of CAD, Hyperlipidemia, DM2, OSA, HTN, s/p coronary artery stent. He is taking mounjaro to aid with weight loss, blood sugar control. LDL remains at goal <70 (65). His wife is motivated to make lifestyle changes as well.  Patient will benefit from participation in intensive cardiac rehab for nutrition, exercise, and lifestyle modification.                Nutrition Goals Discharge (Final Nutrition Goals Re-Evaluation):  Nutrition Goals Re-Evaluation - 10/08/23 1407       Goals   Current Weight 199 lb 8.3 oz (90.5 kg)    Comment LDL 65, triglycerides 154, lipoproteinA WNL, A1c 6.7    Expected Outcome Delano has medical history of CAD, Hyperlipidemia, DM2, OSA, HTN, s/p coronary artery stent. He is taking mounjaro to aid with weight loss, blood sugar control. LDL remains at goal <70 (65). His wife is motivated to make lifestyle changes as well. Patient will benefit from participation in intensive cardiac rehab for nutrition, exercise, and lifestyle modification.             Psychosocial: Target Goals: Acknowledge presence or absence of significant depression and/or stress, maximize coping skills, provide positive support system. Participant is able to verbalize types and ability to use techniques and skills needed for reducing stress and depression.  Initial Review & Psychosocial Screening:  Initial Psych Review & Screening - 09/30/23 1009       Initial Review   Current issues with None Identified;Current Psychotropic Meds      Family Dynamics   Good Support System? Yes   Bilal has his wife for support   Comments Erby denies any current feelings of anxiety/depression/stress. He is taking Zoloft and feels it is working well for him.      Barriers   Psychosocial barriers to participate in program There are no identifiable barriers or psychosocial needs.      Screening Interventions   Interventions Encouraged to exercise;Provide feedback about the scores to participant    Expected Outcomes Short Term goal: Identification and review with participant of any Quality of Life or Depression concerns found by scoring the questionnaire.;Long Term goal: The participant improves quality of Life and PHQ9 Scores  as seen by post scores and/or verbalization of changes             Quality of Life Scores:  Quality of Life - 09/30/23 1022       Quality of Life   Select Quality of Life      Quality of Life Scores   Health/Function Pre 28.23 %    Socioeconomic Pre 28.57 %    Psych/Spiritual Pre 30 %    Family Pre 30 %    GLOBAL  Pre 28.93 %            Scores of 19 and below usually indicate a poorer quality of life in these areas.  A difference of  2-3 points is a clinically meaningful difference.  A difference of 2-3 points in the total score of the Quality of Life Index has been associated with significant improvement in overall quality of life, self-image, physical symptoms, and general health in studies assessing change in quality of life.  PHQ-9: Review Flowsheet       09/30/2023  Depression screen PHQ 2/9  Decreased Interest 0  Down, Depressed, Hopeless 0  PHQ - 2 Score 0  Altered sleeping 1  Tired, decreased energy 1  Change in appetite 0  Feeling bad or failure about yourself  0  Trouble concentrating 0  Moving slowly or fidgety/restless 0  Suicidal thoughts 0  PHQ-9 Score 2  Difficult doing work/chores Not difficult at all   Interpretation of Total Score  Total Score Depression Severity:  1-4 = Minimal depression, 5-9 = Mild depression, 10-14 = Moderate depression, 15-19 = Moderately severe depression, 20-27 = Severe depression   Psychosocial Evaluation and Intervention:   Psychosocial Re-Evaluation:  Psychosocial Re-Evaluation     Row Name 10/12/23 0806 10/28/23 0845 11/22/23 1627         Psychosocial Re-Evaluation   Current issues with None Identified;Current Psychotropic Meds None Identified;Current Psychotropic Meds None Identified;Current Psychotropic Meds     Comments Wasif did not voice any concerns or stressors on his first day of exercise. Discussed PHQ2-9 on 10/25/23. No concerns or stressors noted at this time. --     Interventions Relaxation  education;Encouraged to attend Cardiac Rehabilitation for the exercise;Stress management education Relaxation education;Encouraged to attend Cardiac Rehabilitation for the exercise;Stress management education Relaxation education;Encouraged to attend Cardiac Rehabilitation for the exercise;Stress management education     Continue Psychosocial Services  No Follow up required No Follow up required No Follow up required              Psychosocial Discharge (Final Psychosocial Re-Evaluation):  Psychosocial Re-Evaluation - 11/22/23 1627       Psychosocial Re-Evaluation   Current issues with None Identified;Current Psychotropic Meds    Comments --    Interventions Relaxation education;Encouraged to attend Cardiac Rehabilitation for the exercise;Stress management education    Continue Psychosocial Services  No Follow up required             Vocational Rehabilitation: Provide vocational rehab assistance to qualifying candidates.   Vocational Rehab Evaluation & Intervention:  Vocational Rehab - 09/30/23 1011       Initial Vocational Rehab Evaluation & Intervention   Assessment shows need for Vocational [facility]   [place] is retired            Education: Education Goals: Education classes will be provided on a weekly basis, covering required topics. Participant will state understanding/return demonstration of topics presented.    Education     Row Name 10/08/23 1500     Education   Cardiac Education Topics Pritikin   Select Core Videos     Core Videos   Educator Dietitian   Select Nutrition   Nutrition Overview of the Pritikin Eating Plan   Instruction Review Code 1- Verbalizes Understanding   Class Start Time 1400   Class Stop Time 1445   Class Time Calculation (min) 45 min    Row Name 10/13/23 1400     Education   Cardiac Education Topics  Pritikin   Nurse, Children's Nurse   Nutrition Becoming a Pritikin Chef   Instruction  Review Code 1- Verbalizes Understanding   Class Start Time 1356    Row Name 10/18/23 1400     Education   Cardiac Education Topics Pritikin   Select Core Videos     Core Videos   Educator Exercise Physiologist   Select Nutrition   Nutrition Other  Label Reading   Instruction Review Code 1- Verbalizes Understanding   Class Start Time 1356   Class Stop Time 1430   Class Time Calculation (min) 34 min    Row Name 10/25/23 1600     Education   Cardiac Education Topics Pritikin   Select Workshops     Workshops   Educator Exercise Physiologist   Select Psychosocial   Psychosocial Workshop Recognizing and Reducing Stress   Instruction Review Code 1- Verbalizes Understanding   Class Start Time 1359   Class Stop Time 1445   Class Time Calculation (min) 46 min    Row Name 10/27/23 1500     Education   Cardiac Education Topics Pritikin   Orthoptist   Educator Dietitian   Weekly Topic Efficiency Cooking - Meals in a Snap   Instruction Review Code 1- Verbalizes Understanding   Class Start Time 1358   Class Stop Time 1438   Class Time Calculation (min) 40 min    Row Name 10/29/23 1500     Education   Cardiac Education Topics Pritikin   Nurse, Children's   Educator Dietitian   Select Nutrition   Nutrition Calorie Density   Instruction Review Code 1- Verbalizes Understanding   Class Start Time 1406   Class Stop Time 1501   Class Time Calculation (min) 55 min    Row Name 11/01/23 1500     Education   Cardiac Education Topics Pritikin   Geographical Information Systems Officer Exercise   Exercise Workshop Exercise Basics: Diplomatic Services Operational Officer   Instruction Review Code 1- Verbalizes Understanding   Class Start Time 1400   Class Stop Time 1450   Class Time Calculation (min) 50 min    Row Name 11/03/23 1500     Education   Cardiac Education Topics Pritikin   Hydrologist   Educator Dietitian   Weekly Topic One-Pot Wonders   Instruction Review Code 1- Verbalizes Understanding   Class Start Time 1359   Class Stop Time 1436   Class Time Calculation (min) 37 min    Row Name 11/05/23 1500     Education   Cardiac Education Topics Pritikin   Psychologist, Forensic Exercise Education   Exercise Education Move It!   Instruction Review Code 1- Verbalizes Understanding   Class Start Time 1405   Class Stop Time 1440   Class Time Calculation (min) 35 min    Row Name 11/08/23 1400     Education   Cardiac Education Topics Pritikin   Hospital Doctor Education   General Education Hypertension and Heart Disease   Instruction Review Code 1- Verbalizes Understanding   Class Start Time  1400   Class Stop Time 1435   Class Time Calculation (min) 35 min    Row Name 11/12/23 1400     Education   Cardiac Education Topics Pritikin   Glass Blower/designer Nutrition   Nutrition Workshop Targeting Your Nutrition Priorities   Instruction Review Code 1- Verbalizes Understanding   Class Start Time 1357   Class Stop Time 1436   Class Time Calculation (min) 39 min    Row Name 11/19/23 1400     Education   Cardiac Education Topics Pritikin   Licensed Conveyancer Nutrition   Nutrition Dining Out - Part 1   Instruction Review Code 1- Verbalizes Understanding   Class Start Time 1358   Class Stop Time 1442   Class Time Calculation (min) 44 min    Row Name 11/22/23 1400     Education   Cardiac Education Topics Pritikin   Psychologist, Forensic Exercise Education   Exercise Education Biomechanial Limitations   Instruction Review Code 1- Verbalizes Understanding    Class Start Time 1400   Class Stop Time 1440   Class Time Calculation (min) 40 min            Core Videos: Exercise    Move It!  Clinical staff conducted group or individual video education with verbal and written material and guidebook.  Patient learns the recommended Pritikin exercise program. Exercise with the goal of living a long, healthy life. Some of the health benefits of exercise include controlled diabetes, healthier blood pressure levels, improved cholesterol levels, improved heart and lung capacity, improved sleep, and better body composition. Everyone should speak with their doctor before starting or changing an exercise routine.  Biomechanical Limitations Clinical staff conducted group or individual video education with verbal and written material and guidebook.  Patient learns how biomechanical limitations can impact exercise and how we can mitigate and possibly overcome limitations to have an impactful and balanced exercise routine.  Body Composition Clinical staff conducted group or individual video education with verbal and written material and guidebook.  Patient learns that body composition (ratio of muscle mass to fat mass) is a key component to assessing overall fitness, rather than body weight alone. Increased fat mass, especially visceral belly fat, can put us  at increased risk for metabolic syndrome, type 2 diabetes, heart disease, and even death. It is recommended to combine diet and exercise (cardiovascular and resistance training) to improve your body composition. Seek guidance from your physician and exercise physiologist before implementing an exercise routine.  Exercise Action Plan Clinical staff conducted group or individual video education with verbal and written material and guidebook.  Patient learns the recommended strategies to achieve and enjoy long-term exercise adherence, including variety, self-motivation, self-efficacy, and positive decision making.  Benefits of exercise include fitness, good health, weight management, more energy, better sleep, less stress, and overall well-being.  Medical   Heart Disease Risk Reduction Clinical staff conducted group or individual video education with verbal and written material and guidebook.  Patient learns our heart is our most vital organ as it circulates oxygen, nutrients, white blood cells, and hormones throughout the entire body, and carries waste away. Data supports a plant-based eating plan like the Pritikin Program for its effectiveness in slowing progression of and reversing heart disease.  The video provides a number of recommendations to address heart disease.   Metabolic Syndrome and Belly Fat  Clinical staff conducted group or individual video education with verbal and written material and guidebook.  Patient learns what metabolic syndrome is, how it leads to heart disease, and how one can reverse it and keep it from coming back. You have metabolic syndrome if you have 3 of the following 5 criteria: abdominal obesity, high blood pressure, high triglycerides, low HDL cholesterol, and high blood sugar.  Hypertension and Heart Disease Clinical staff conducted group or individual video education with verbal and written material and guidebook.  Patient learns that high blood pressure, or hypertension, is very common in the [place] . Hypertension is largely due to excessive salt intake, but other important risk factors include being overweight, physical inactivity, drinking too much alcohol, smoking, and not eating enough potassium from fruits and vegetables. High blood pressure is a leading risk factor for heart attack, stroke, congestive heart failure, dementia, kidney failure, and premature death. Long-term effects of excessive salt intake include stiffening of the arteries and thickening of heart muscle and organ damage. Recommendations include ways to reduce hypertension and the risk of heart  disease.  Diseases of Our Time - Focusing on Diabetes Clinical staff conducted group or individual video education with verbal and written material and guidebook.  Patient learns why the best way to stop diseases of our time is prevention, through food and other lifestyle changes. Medicine (such as prescription pills and surgeries) is often only a Band-Aid on the problem, not a long-term solution. Most common diseases of our time include obesity, type 2 diabetes, hypertension, heart disease, and cancer. The Pritikin Program is recommended and has been proven to help reduce, reverse, and/or prevent the damaging effects of metabolic syndrome.  Nutrition   Overview of the Pritikin Eating Plan  Clinical staff conducted group or individual video education with verbal and written material and guidebook.  Patient learns about the Pritikin Eating Plan for disease risk reduction. The Pritikin Eating Plan emphasizes a wide variety of unrefined, minimally-processed carbohydrates, like fruits, vegetables, whole grains, and legumes. Go, Caution, and Stop food choices are explained. Plant-based and lean animal proteins are emphasized. Rationale provided for low sodium intake for blood pressure control, low added sugars for blood sugar stabilization, and low added fats and oils for coronary artery disease risk reduction and weight management.  Calorie Density  Clinical staff conducted group or individual video education with verbal and written material and guidebook.  Patient learns about calorie density and how it impacts the Pritikin Eating Plan. Knowing the characteristics of the food you choose will help you decide whether those foods will lead to weight gain or weight loss, and whether you want to consume more or less of them. Weight loss is usually a side effect of the Pritikin Eating Plan because of its focus on low calorie-dense foods.  Label Reading  Clinical staff conducted group or individual video  education with verbal and written material and guidebook.  Patient learns about the Pritikin recommended label reading guidelines and corresponding recommendations regarding calorie density, added sugars, sodium content, and whole grains.  Dining Out - Part 1  Clinical staff conducted group or individual video education with verbal and written material and guidebook.  Patient learns that restaurant meals can be sabotaging because they can be so high in calories, fat, sodium, and/or sugar. Patient learns recommended strategies on how to positively address this and avoid unhealthy pitfalls.  Facts on Fats  Clinical staff conducted group or individual video education with verbal and written material and guidebook.  Patient learns that lifestyle modifications can be just as effective, if not more so, as many medications for lowering your risk of heart disease. A Pritikin lifestyle can help to reduce your risk of inflammation and atherosclerosis (cholesterol build-up, or plaque, in the artery walls). Lifestyle interventions such as dietary choices and physical activity address the cause of atherosclerosis. A review of the types of fats and their impact on blood cholesterol levels, along with dietary recommendations to reduce fat intake is also included.  Nutrition Action Plan  Clinical staff conducted group or individual video education with verbal and written material and guidebook.  Patient learns how to incorporate Pritikin recommendations into their lifestyle. Recommendations include planning and keeping personal health goals in mind as an important part of their success.  Healthy Mind-Set    Healthy Minds, Bodies, Hearts  Clinical staff conducted group or individual video education with verbal and written material and guidebook.  Patient learns how to identify when they are stressed. Video will discuss the impact of that stress, as well as the many benefits of stress management. Patient will also  be introduced to stress management techniques. The way we think, act, and feel has an impact on our hearts.  How Our Thoughts Can Heal Our Hearts  Clinical staff conducted group or individual video education with verbal and written material and guidebook.  Patient learns that negative thoughts can cause depression and anxiety. This can result in negative lifestyle behavior and serious health problems. Cognitive behavioral therapy is an effective method to help control our thoughts in order to change and improve our emotional outlook.  Additional Videos:  Exercise    Improving Performance  Clinical staff conducted group or individual video education with verbal and written material and guidebook.  Patient learns to use a non-linear approach by alternating intensity levels and lengths of time spent exercising to help burn more calories and lose more body fat. Cardiovascular exercise helps improve heart health, metabolism, hormonal balance, blood sugar control, and recovery from fatigue. Resistance training improves strength, endurance, balance, coordination, reaction time, metabolism, and muscle mass. Flexibility exercise improves circulation, posture, and balance. Seek guidance from your physician and exercise physiologist before implementing an exercise routine and learn your capabilities and proper form for all exercise.  Introduction to Yoga  Clinical staff conducted group or individual video education with verbal and written material and guidebook.  Patient learns about yoga, a discipline of the coming together of mind, breath, and body. The benefits of yoga include improved flexibility, improved range of motion, better posture and core strength, increased lung function, weight loss, and positive self-image. Yoga's heart health benefits include lowered blood pressure, healthier heart rate, decreased cholesterol and triglyceride levels, improved immune function, and reduced stress. Seek guidance  from your physician and exercise physiologist before implementing an exercise routine and learn your capabilities and proper form for all exercise.  Medical   Aging: Enhancing Your Quality of Life  Clinical staff conducted group or individual video education with verbal and written material and guidebook.  Patient learns key strategies and recommendations to stay in good physical health and enhance quality of life, such as prevention strategies, having an advocate, securing a Health Care Proxy and Power of Attorney, and keeping a list of medications and system for tracking them. It also discusses how to avoid risk for bone loss.  Biology of Weight Control  Clinical staff conducted group or individual video education with verbal and written material and guidebook.  Patient learns that weight gain occurs because we consume more calories than we burn (eating more, moving less). Even if your body weight is normal, you may have higher ratios of fat compared to muscle mass. Too much body fat puts you at increased risk for cardiovascular disease, heart attack, stroke, type 2 diabetes, and obesity-related cancers. In addition to exercise, following the Pritikin Eating Plan can help reduce your risk.  Decoding Lab Results  Clinical staff conducted group or individual video education with verbal and written material and guidebook.  Patient learns that lab test reflects one measurement whose values change over time and are influenced by many factors, including medication, stress, sleep, exercise, food, hydration, pre-existing medical conditions, and more. It is recommended to use the knowledge from this video to become more involved with your lab results and evaluate your numbers to speak with your doctor.   Diseases of Our Time - Overview  Clinical staff conducted group or individual video education with verbal and written material and guidebook.  Patient learns that according to the CDC, 50% to 70% of  chronic diseases (such as obesity, type 2 diabetes, elevated lipids, hypertension, and heart disease) are avoidable through lifestyle improvements including healthier food choices, listening to satiety cues, and increased physical activity.  Sleep Disorders Clinical staff conducted group or individual video education with verbal and written material and guidebook.  Patient learns how good quality and duration of sleep are important to overall health and well-being. Patient also learns about sleep disorders and how they impact health along with recommendations to address them, including discussing with a physician.  Nutrition  Dining Out - Part 2 Clinical staff conducted group or individual video education with verbal and written material and guidebook.  Patient learns how to plan ahead and communicate in order to maximize their dining experience in a healthy and nutritious manner. Included are recommended food choices based on the type of restaurant the patient is visiting.   Fueling a Banker conducted group or individual video education with verbal and written material and guidebook.  There is a strong connection between our food choices and our health. Diseases like obesity and type 2 diabetes are very prevalent and are in large-part due to lifestyle choices. The Pritikin Eating Plan provides plenty of food and hunger-curbing satisfaction. It is easy to follow, affordable, and helps reduce health risks.  Menu Workshop  Clinical staff conducted group or individual video education with verbal and written material and guidebook.  Patient learns that restaurant meals can sabotage health goals because they are often packed with calories, fat, sodium, and sugar. Recommendations include strategies to plan ahead and to communicate with the manager, chef, or server to help order a healthier meal.  Planning Your Eating Strategy  Clinical staff conducted group or individual video  education with verbal and written material and guidebook.  Patient learns about the Pritikin Eating Plan and its benefit of reducing the risk of disease. The Pritikin Eating Plan does not focus on calories. Instead, it emphasizes high-quality, nutrient-rich foods. By knowing the characteristics of the foods, we choose, we can determine their calorie density and make informed decisions.  Targeting Your Nutrition Priorities  Clinical staff conducted group or individual video education with verbal and written material and guidebook.  Patient learns that lifestyle habits have a tremendous impact on disease risk and progression. This video provides eating  and physical activity recommendations based on your personal health goals, such as reducing LDL cholesterol, losing weight, preventing or controlling type 2 diabetes, and reducing high blood pressure.  Vitamins and Minerals  Clinical staff conducted group or individual video education with verbal and written material and guidebook.  Patient learns different ways to obtain key vitamins and minerals, including through a recommended healthy diet. It is important to discuss all supplements you take with your doctor.   Healthy Mind-Set    Smoking Cessation  Clinical staff conducted group or individual video education with verbal and written material and guidebook.  Patient learns that cigarette smoking and tobacco addiction pose a serious health risk which affects millions of people. Stopping smoking will significantly reduce the risk of heart disease, lung disease, and many forms of cancer. Recommended strategies for quitting are covered, including working with your doctor to develop a successful plan.  Culinary   Becoming a Set Designer conducted group or individual video education with verbal and written material and guidebook.  Patient learns that cooking at home can be healthy, cost-effective, quick, and puts them in control. Keys to  cooking healthy recipes will include looking at your recipe, assessing your equipment needs, planning ahead, making it simple, choosing cost-effective seasonal ingredients, and limiting the use of added fats, salts, and sugars.  Cooking - Breakfast and Snacks  Clinical staff conducted group or individual video education with verbal and written material and guidebook.  Patient learns how important breakfast is to satiety and nutrition through the entire day. Recommendations include key foods to eat during breakfast to help stabilize blood sugar levels and to prevent overeating at meals later in the day. Planning ahead is also a key component.  Cooking - Educational Psychologist conducted group or individual video education with verbal and written material and guidebook.  Patient learns eating strategies to improve overall health, including an approach to cook more at home. Recommendations include thinking of animal protein as a side on your plate rather than center stage and focusing instead on lower calorie dense options like vegetables, fruits, whole grains, and plant-based proteins, such as beans. Making sauces in large quantities to freeze for later and leaving the skin on your vegetables are also recommended to maximize your experience.  Cooking - Healthy Salads and Dressing Clinical staff conducted group or individual video education with verbal and written material and guidebook.  Patient learns that vegetables, fruits, whole grains, and legumes are the foundations of the Pritikin Eating Plan. Recommendations include how to incorporate each of these in flavorful and healthy salads, and how to create homemade salad dressings. Proper handling of ingredients is also covered. Cooking - Soups and [employer] - Soups and Desserts Clinical staff conducted group or individual video education with verbal and written material and guidebook.  Patient learns that Pritikin soups and desserts  make for easy, nutritious, and delicious snacks and meal components that are low in sodium, fat, sugar, and calorie density, while high in vitamins, minerals, and filling fiber. Recommendations include simple and healthy ideas for soups and desserts.   Overview     The Pritikin Solution Program Overview Clinical staff conducted group or individual video education with verbal and written material and guidebook.  Patient learns that the results of the Pritikin Program have been documented in more than 100 articles published in peer-reviewed journals, and the benefits include reducing risk factors for (and, in some cases, even reversing) high cholesterol,  high blood pressure, type 2 diabetes, obesity, and more! An overview of the three key pillars of the Pritikin Program will be covered: eating well, doing regular exercise, and having a healthy mind-set.  WORKSHOPS  Exercise: Exercise Basics: Building Your Action Plan Clinical staff led group instruction and group discussion with PowerPoint presentation and patient guidebook. To enhance the learning environment the use of posters, models and videos may be added. At the conclusion of this workshop, patients will comprehend the difference between physical activity and exercise, as well as the benefits of incorporating both, into their routine. Patients will understand the FITT (Frequency, Intensity, Time, and Type) principle and how to use it to build an exercise action plan. In addition, safety concerns and other considerations for exercise and cardiac rehab will be addressed by the presenter. The purpose of this lesson is to promote a comprehensive and effective weekly exercise routine in order to improve patients' overall level of fitness.   Managing Heart Disease: Your Path to a Healthier Heart Clinical staff led group instruction and group discussion with PowerPoint presentation and patient guidebook. To enhance the learning environment the use  of posters, models and videos may be added.At the conclusion of this workshop, patients will understand the anatomy and physiology of the heart. Additionally, they will understand how Pritikin's three pillars impact the risk factors, the progression, and the management of heart disease.  The purpose of this lesson is to provide a high-level overview of the heart, heart disease, and how the Pritikin lifestyle positively impacts risk factors.  Exercise Biomechanics Clinical staff led group instruction and group discussion with PowerPoint presentation and patient guidebook. To enhance the learning environment the use of posters, models and videos may be added. Patients will learn how the structural parts of their bodies function and how these functions impact their daily activities, movement, and exercise. Patients will learn how to promote a neutral spine, learn how to manage pain, and identify ways to improve their physical movement in order to promote healthy living. The purpose of this lesson is to expose patients to common physical limitations that impact physical activity. Participants will learn practical ways to adapt and manage aches and pains, and to minimize their effect on regular exercise. Patients will learn how to maintain good posture while sitting, walking, and lifting.  Balance Training and Fall Prevention  Clinical staff led group instruction and group discussion with PowerPoint presentation and patient guidebook. To enhance the learning environment the use of posters, models and videos may be added. At the conclusion of this workshop, patients will understand the importance of their sensorimotor skills (vision, proprioception, and the vestibular system) in maintaining their ability to balance as they age. Patients will apply a variety of balancing exercises that are appropriate for their current level of function. Patients will understand the common causes for poor balance,  possible solutions to these problems, and ways to modify their physical environment in order to minimize their fall risk. The purpose of this lesson is to teach patients about the importance of maintaining balance as they age and ways to minimize their risk of falling.  WORKSHOPS   Nutrition:  Fueling a Ship Broker led group instruction and group discussion with PowerPoint presentation and patient guidebook. To enhance the learning environment the use of posters, models and videos may be added. Patients will review the foundational principles of the [facility] Eating Plan and understand what constitutes a serving size in each of the food groups. Patients will  also learn [facility]-friendly foods that are better choices when away from home and review make-ahead meal and snack options. Calorie density will be reviewed and applied to three nutrition priorities: weight maintenance, weight loss, and weight gain. The purpose of this lesson is to reinforce (in a group setting) the key concepts around what patients are recommended to eat and how to apply these guidelines when away from home by planning and selecting Pritikin-friendly options. Patients will understand how calorie density may be adjusted for different weight management goals.  Mindful Eating  Clinical staff led group instruction and group discussion with PowerPoint presentation and patient guidebook. To enhance the learning environment the use of posters, models and videos may be added. Patients will briefly review the concepts of the [facility] Eating Plan and the importance of low-calorie dense foods. The concept of mindful eating will be introduced as well as the importance of paying attention to internal hunger signals. Triggers for non-hunger eating and techniques for dealing with triggers will be explored. The purpose of this lesson is to provide patients with the opportunity to review the basic principles of the [facility] Eating  Plan, discuss the value of eating mindfully and how to measure internal cues of hunger and fullness using the Hunger Scale. Patients will also discuss reasons for non-hunger eating and learn strategies to use for controlling emotional eating.  Targeting Your Nutrition Priorities Clinical staff led group instruction and group discussion with PowerPoint presentation and patient guidebook. To enhance the learning environment the use of posters, models and videos may be added. Patients will learn how to determine their genetic susceptibility to disease by reviewing their family history. Patients will gain insight into the importance of diet as part of an overall healthy lifestyle in mitigating the impact of genetics and other environmental insults. The purpose of this lesson is to provide patients with the opportunity to assess their personal nutrition priorities by looking at their family history, their own health history and current risk factors. Patients will also be able to discuss ways of prioritizing and modifying the [facility] Eating Plan for their highest risk areas  Menu  Clinical staff led group instruction and group discussion with PowerPoint presentation and patient guidebook. To enhance the learning environment the use of posters, models and videos may be added. Using menus brought in from [employer], or printed from [employer], patients will apply the [facility]in dining out guidelines that were presented in the [employer] video. Patients will also be able to practice these guidelines in a variety of provided scenarios. The purpose of this lesson is to provide patients with the opportunity to practice hands-on learning of the [facility] Dining Out guidelines with actual menus and practice scenarios.  Label Reading Clinical staff led group instruction and group discussion with PowerPoint presentation and patient guidebook. To enhance the learning environment the use of  posters, models and videos may be added. Patients will review and discuss the [facility] label reading guidelines presented in [employer]'s Label Reading Educational series video. Using fool labels brought in from local grocery stores and markets, patients will apply the label reading guidelines and determine if the packaged food meet the [facility]ritikin guidelines. The purpose of this lesson is to provide patients with the opportunity to review, discuss, and practice hands-on learning of the [facility] Label Reading guidelines with actual packaged food labels. Cooking School  [facility]'s [employer] are designed to teach patients ways to prepare quick, simple, and affordable recipes at home. The importance  of nutrition's role in chronic disease risk reduction is reflected in its emphasis in the overall [facility] program. By learning how to prepare essential core [facility]in Eating Plan recipes, patients will increase control over what they eat; be able to customize the flavor of foods without the use of added salt, sugar, or fat; and improve the quality of the food they consume. By learning a set of core recipes which are easily assembled, quickly prepared, and affordable, patients are more likely to prepare more healthy foods at home. These workshops focus on convenient breakfasts, simple entres, side dishes, and desserts which can be prepared with minimal effort and are consistent with nutrition recommendations for cardiovascular risk reduction. Cooking [employer] are taught by a armed forces logistics/support/administrative officer (RD) who has been trained by the [employer]. The chef or RD has a clear understanding of the importance of minimizing - if not completely eliminating - added fat, sugar, and sodium in recipes. Throughout the series of Cooking School Workshop sessions, patients will learn about healthy ingredients and efficient methods of cooking to build confidence in their capability to  prepare    Cooking School weekly topics:  Adding Flavor- Sodium-Free  Fast and Healthy Breakfasts  Powerhouse Plant-Based Proteins  Satisfying Salads and Dressings  Simple Sides and Sauces  International Cuisine-Spotlight on the [employer] Zones  Delicious Desserts  Savory Soups  [employer] - Meals in a Astronomer Appetizers and Snacks  Comforting Weekend Breakfasts  One-Pot Wonders   Fast Evening Meals  Landscape Architect Your Pritikin Plate  WORKSHOPS   Healthy Mindset (Psychosocial):  Focused Goals, Sustainable Changes Clinical staff led group instruction and group discussion with PowerPoint presentation and patient guidebook. To enhance the learning environment the use of posters, models and videos may be added. Patients will be able to apply effective goal setting strategies to establish at least one personal goal, and then take consistent, meaningful action toward that goal. They will learn to identify common barriers to achieving personal goals and develop strategies to overcome them. Patients will also gain an understanding of how our mind-set can impact our ability to achieve goals and the importance of cultivating a positive and growth-oriented mind-set. The purpose of this lesson is to provide patients with a deeper understanding of how to set and achieve personal goals, as well as the tools and strategies needed to overcome common obstacles which may arise along the way.  From Head to Heart: The Power of a Healthy Outlook  Clinical staff led group instruction and group discussion with PowerPoint presentation and patient guidebook. To enhance the learning environment the use of posters, models and videos may be added. Patients will be able to recognize and describe the impact of emotions and mood on physical health. They will discover the importance of self-care and explore self-care practices which may work for them. Patients will also learn how to utilize  the 4 C's to cultivate a healthier outlook and better manage stress and challenges. The purpose of this lesson is to demonstrate to patients how a healthy outlook is an essential part of maintaining good health, especially as they continue their cardiac rehab journey.  Healthy Sleep for a Healthy Heart Clinical staff led group instruction and group discussion with PowerPoint presentation and patient guidebook. To enhance the learning environment the use of posters, models and videos may be added. At the conclusion of this workshop, patients will be able to demonstrate knowledge of the importance of sleep  to overall health, well-being, and quality of life. They will understand the symptoms of, and treatments for, common sleep disorders. Patients will also be able to identify daytime and nighttime behaviors which impact sleep, and they will be able to apply these tools to help manage sleep-related challenges. The purpose of this lesson is to provide patients with a general overview of sleep and outline the importance of quality sleep. Patients will learn about a few of the most common sleep disorders. Patients will also be introduced to the concept of "sleep hygiene," and discover ways to self-manage certain sleeping problems through simple daily behavior changes. Finally, the workshop will motivate patients by clarifying the links between quality sleep and their goals of heart-healthy living.   Recognizing and Reducing Stress Clinical staff led group instruction and group discussion with PowerPoint presentation and patient guidebook. To enhance the learning environment the use of posters, models and videos may be added. At the conclusion of this workshop, patients will be able to understand the types of stress reactions, differentiate between acute and chronic stress, and recognize the impact that chronic stress has on their health. They will also be able to apply different coping mechanisms, such as reframing  negative self-talk. Patients will have the opportunity to practice a variety of stress management techniques, such as deep abdominal breathing, progressive muscle relaxation, and/or guided imagery.  The purpose of this lesson is to educate patients on the role of stress in their lives and to provide healthy techniques for coping with it.  Learning Barriers/Preferences:  Learning Barriers/Preferences - 09/30/23 1010       Learning Barriers/Preferences   Learning Barriers Sight   wears glasses   Learning Preferences Audio;Computer/Internet;Group Instruction;Individual Instruction;Pictoral;Skilled Demonstration;Verbal Instruction;Video;Written Material             Education Topics:  Knowledge Questionnaire Score:  Knowledge Questionnaire Score - 09/30/23 1011       Knowledge Questionnaire Score   Pre Score 21/24             Core Components/Risk Factors/Patient Goals at Admission:  Personal Goals and Risk Factors at Admission - 09/30/23 1011       Core Components/Risk Factors/Patient Goals on Admission    Weight Management Yes;Obesity;Weight Loss    Intervention Weight Management: Develop a combined nutrition and exercise program designed to reach desired caloric intake, while maintaining appropriate intake of nutrient and fiber, sodium and fats, and appropriate energy expenditure required for the weight goal.;Weight Management: Provide education and appropriate resources to help participant work on and attain dietary goals.;Weight Management/Obesity: Establish reasonable short term and long term weight goals.;Obesity: Provide education and appropriate resources to help participant work on and attain dietary goals.    Expected Outcomes Short Term: Continue to assess and modify interventions until short term weight is achieved;Long Term: Adherence to nutrition and physical activity/exercise program aimed toward attainment of established weight goal;Weight Loss: Understanding of  general recommendations for a balanced deficit meal plan, which promotes 1-2 lb weight loss per week and includes a negative energy balance of [phone] kcal/d;Understanding recommendations for meals to include 15-35% energy as protein, 25-35% energy from fat, 35-60% energy from carbohydrates, less than 200mg  of dietary cholesterol, 20-35 gm of total fiber daily;Understanding of distribution of calorie intake throughout the day with the consumption of 4-5 meals/snacks    Diabetes Yes    Intervention Provide education about signs/symptoms and action to take for hypo/hyperglycemia.;Provide education about proper nutrition, including hydration, and aerobic/resistive exercise prescription along with prescribed  medications to achieve blood glucose in normal ranges: Fasting glucose 65-99 mg/dL    Expected Outcomes Short Term: Participant verbalizes understanding of the signs/symptoms and immediate care of hyper/hypoglycemia, proper foot care and importance of medication, aerobic/resistive exercise and nutrition plan for blood glucose control.;Long Term: Attainment of HbA1C < 7%.    Hypertension Yes    Intervention Provide education on lifestyle modifcations including regular physical activity/exercise, weight management, moderate sodium restriction and increased consumption of fresh fruit, vegetables, and low fat dairy, alcohol moderation, and smoking cessation.;Monitor prescription use compliance.    Expected Outcomes Short Term: Continued assessment and intervention until BP is < 140/72mm HG in hypertensive participants. < 130/41mm HG in hypertensive participants with diabetes, heart failure or chronic kidney disease.;Long Term: Maintenance of blood pressure at goal levels.    Lipids Yes    Intervention Provide education and support for participant on nutrition & aerobic/resistive exercise along with prescribed medications to achieve LDL 70mg , HDL >40mg .    Expected Outcomes Short Term: Participant states  understanding of desired cholesterol values and is compliant with medications prescribed. Participant is following exercise prescription and nutrition guidelines.;Long Term: Cholesterol controlled with medications as prescribed, with individualized exercise RX and with personalized nutrition plan. Value goals: LDL < 70mg , HDL > 40 mg.             Core Components/Risk Factors/Patient Goals Review:   Goals and Risk Factor Review     Row Name 10/12/23 0809 10/28/23 0848 11/22/23 1632         Core Components/Risk Factors/Patient Goals Review   Personal Goals Review Weight Management/Obesity;Hypertension;Lipids;Diabetes Weight Management/Obesity;Hypertension;Lipids;Diabetes Weight Management/Obesity;Hypertension;Lipids;Diabetes     Review Shiva started cardiac rehab on 10/08/23. Mare did well with exercise. Vital signs and CBG's were stable Jerimyah has been doing  well with exercise. Vital signs and CBG's remain stable. Donyale has lost 2.7 kg since starting cardiac rehab, Shashwat has been doing  well with exercise. Vital signs and CBG's remain stable. Kj has lost 2.1 kg since starting cardiac rehab,     Expected Outcomes Leshaun will continue to particpate in cardiac rehab for exercise, nutrition and lifestyle modifications. Omero will continue to particpate in cardiac rehab for exercise, nutrition and lifestyle modifications. Zaydenn will continue to particpate in cardiac rehab for exercise, nutrition and lifestyle modifications.              Core Components/Risk Factors/Patient Goals at Discharge (Final Review):   Goals and Risk Factor Review - 11/22/23 1632       Core Components/Risk Factors/Patient Goals Review   Personal Goals Review Weight Management/Obesity;Hypertension;Lipids;Diabetes    Review Eean has been doing  well with exercise. Vital signs and CBG's remain stable. Bayler has lost 2.1 kg since starting cardiac rehab,    Expected Outcomes Plez will continue to particpate in cardiac rehab for  exercise, nutrition and lifestyle modifications.             ITP Comments:  ITP Comments     Row Name 09/30/23 0940 10/06/23 0858 10/12/23 0804 10/28/23 9157 11/22/23 1624   ITP Comments Wilbert Bihari, MD: Medical Director.  Introduction to the [facility] Education Program/Intensive Cardiac Rehab.  Initial orientation packet reviewed with the patient. 30 Day ITP Review. Zadiel will tenatively start exercise on 10/08/23. 30 Day ITP Review. Victoriano  started cardiac rehab on 10/08/23. Brison did well with exercise. 30 Day ITP Review. Edrei  has good participation when in attendance at  cardiac rehab 30 Day ITP Review. Norleen  has good attendance and participation in cardiac rehab            Comments: See ITP comments.Hadassah Elpidio Quan RN BSN

## 2023-11-24 ENCOUNTER — Encounter (HOSPITAL_COMMUNITY)
Admission: RE | Admit: 2023-11-24 | Discharge: 2023-11-24 | Disposition: A | Discharge status: Home / Self Care | Payer: PPO | Source: Ambulatory Visit | Attending: Internal Medicine | Admitting: Internal Medicine

## 2023-11-24 DIAGNOSIS — Z955 Presence of coronary angioplasty implant and graft: Secondary | ICD-10-CM | POA: Diagnosis not present

## 2023-11-26 ENCOUNTER — Telehealth (HOSPITAL_COMMUNITY): Payer: Self-pay

## 2023-11-26 ENCOUNTER — Encounter (HOSPITAL_COMMUNITY): Payer: PPO

## 2023-11-26 NOTE — Telephone Encounter (Signed)
 Left VM for patient, no classes offered today after 12:30 due to impending weather

## 2023-11-29 ENCOUNTER — Encounter (HOSPITAL_COMMUNITY)
Admission: RE | Admit: 2023-11-29 | Discharge: 2023-11-29 | Disposition: A | Discharge status: Home / Self Care | Payer: PPO | Source: Ambulatory Visit | Attending: Internal Medicine | Admitting: Internal Medicine

## 2023-11-29 DIAGNOSIS — Z955 Presence of coronary angioplasty implant and graft: Secondary | ICD-10-CM | POA: Diagnosis not present

## 2023-12-01 ENCOUNTER — Encounter (HOSPITAL_COMMUNITY)
Admission: RE | Admit: 2023-12-01 | Discharge: 2023-12-01 | Disposition: A | Discharge status: Home / Self Care | Payer: PPO | Source: Ambulatory Visit | Attending: Internal Medicine | Admitting: Internal Medicine

## 2023-12-01 DIAGNOSIS — Z955 Presence of coronary angioplasty implant and graft: Secondary | ICD-10-CM | POA: Diagnosis not present

## 2023-12-02 ENCOUNTER — Other Ambulatory Visit (HOSPITAL_COMMUNITY): Payer: Self-pay

## 2023-12-03 ENCOUNTER — Encounter (HOSPITAL_COMMUNITY)
Admission: RE | Admit: 2023-12-03 | Discharge: 2023-12-03 | Disposition: A | Discharge status: Home / Self Care | Payer: PPO | Source: Ambulatory Visit | Attending: Internal Medicine

## 2023-12-03 DIAGNOSIS — Z955 Presence of coronary angioplasty implant and graft: Secondary | ICD-10-CM

## 2023-12-06 ENCOUNTER — Encounter (HOSPITAL_COMMUNITY)
Admission: RE | Admit: 2023-12-06 | Discharge: 2023-12-06 | Disposition: A | Discharge status: Home / Self Care | Payer: PPO | Source: Ambulatory Visit | Attending: Internal Medicine | Admitting: Internal Medicine

## 2023-12-06 DIAGNOSIS — Z955 Presence of coronary angioplasty implant and graft: Secondary | ICD-10-CM

## 2023-12-07 ENCOUNTER — Telehealth: Payer: Self-pay | Admitting: *Deleted

## 2023-12-07 ENCOUNTER — Encounter: Payer: Self-pay | Admitting: Internal Medicine

## 2023-12-07 NOTE — Telephone Encounter (Signed)
   Patient Name: Patrick Cooley  DOB: [birth year] MRN: [id number]  Primary Cardiologist: Parke Poisson, MD  Chart reviewed as part of pre-operative protocol coverage.   Simple dental extractions (i.e. 1-2 teeth), cleanings are considered low risk procedures per guidelines and generally do not require any specific cardiac clearance. It is also generally accepted that for simple extractions and dental cleanings, there is no need to interrupt blood thinner therapy.  SBE prophylaxis is not required for the patient from a cardiac standpoint.  I will route this recommendation to the requesting party via Epic fax function and remove from pre-op pool.  Please call with questions.  Joylene Grapes, NP 12/07/2023, 12:45 PM

## 2023-12-07 NOTE — Telephone Encounter (Signed)
   Pre-operative Risk Assessment    Patient Name: Savion Dunavin  DOB: 14-Jul-1956 MRN: [id number]   Date of last office visit: 09/22/23 HAO MENG, PAC Date of next office visit: 02/01/24 DR. ACHARYA  Request for Surgical Clearance    Procedure:   DENTAL CLEANING, X-RAY AND DENTAL EXAM PER CLEARANCE FORM  Date of Surgery:  Clearance 12/08/23                                Surgeon:  DR. Luellen Pucker, DDS Surgeon's Group or Practice Name:  [facility] D[facility] Phone number:  [phone] Fax number:  [phone]   Type of Clearance Requested:   - Medical ; PT IS ON ASA THOUGH NO MEDICATIONS INDICATED ON FORM TO BE HELD   Type of Anesthesia:  Not Indicated   Additional requests/questions:    Elpidio Anis   12/07/2023, 12:34 PM

## 2023-12-08 ENCOUNTER — Encounter (HOSPITAL_COMMUNITY)
Admission: RE | Admit: 2023-12-08 | Discharge: 2023-12-08 | Disposition: A | Discharge status: Home / Self Care | Payer: PPO | Source: Ambulatory Visit | Attending: Internal Medicine | Admitting: Internal Medicine

## 2023-12-08 DIAGNOSIS — Z955 Presence of coronary angioplasty implant and graft: Secondary | ICD-10-CM | POA: Diagnosis not present

## 2023-12-10 ENCOUNTER — Encounter (HOSPITAL_COMMUNITY)
Admission: RE | Admit: 2023-12-10 | Discharge: 2023-12-10 | Disposition: A | Discharge status: Home / Self Care | Payer: PPO | Source: Ambulatory Visit | Attending: Internal Medicine

## 2023-12-10 DIAGNOSIS — Z955 Presence of coronary angioplasty implant and graft: Secondary | ICD-10-CM | POA: Diagnosis not present

## 2023-12-13 ENCOUNTER — Encounter (HOSPITAL_COMMUNITY)
Admission: RE | Admit: 2023-12-13 | Discharge: 2023-12-13 | Disposition: A | Discharge status: Home / Self Care | Payer: PPO | Source: Ambulatory Visit | Attending: Internal Medicine | Admitting: Internal Medicine

## 2023-12-13 DIAGNOSIS — Z955 Presence of coronary angioplasty implant and graft: Secondary | ICD-10-CM

## 2023-12-15 ENCOUNTER — Encounter (HOSPITAL_COMMUNITY)
Admission: RE | Admit: 2023-12-15 | Discharge: 2023-12-15 | Disposition: A | Discharge status: Home / Self Care | Payer: PPO | Source: Ambulatory Visit | Attending: Internal Medicine | Admitting: Internal Medicine

## 2023-12-15 DIAGNOSIS — Z955 Presence of coronary angioplasty implant and graft: Secondary | ICD-10-CM | POA: Diagnosis not present

## 2023-12-17 ENCOUNTER — Encounter (HOSPITAL_COMMUNITY): Payer: PPO

## 2023-12-17 NOTE — Progress Notes (Signed)
Cardiac Individual Treatment Plan  Patient Details  Name: Tabor Denham MRN: [id number] Date of Birth: 08-25-1956 Referring Provider:   Flowsheet Row INTENSIVE CARDIAC REHAB ORIENT from 09/30/2023 in [facility] for Heart, Vascular, & Lung Health  Referring Provider Weston Brass, MD       Initial Encounter Date:  Flowsheet Row INTENSIVE CARDIAC REHAB ORIENT from 09/30/2023 in [facility] for Heart, Vascular, & Lung Health  Date 09/30/23       Visit Diagnosis: 09/08/23 Status post coronary artery stent placement  Patient's Home Medications on Admission:  Current Outpatient Medications:    alfuzosin (UROXATRAL) 10 MG 24 hr tablet, Take 10 mg by mouth at bedtime., Disp: , Rfl:    aspirin EC 81 MG tablet, Take 81 mg by mouth at bedtime. Swallow whole., Disp: , Rfl:    atorvastatin (LIPITOR) 80 MG tablet, Take 1 tablet (80 mg total) by mouth every morning., Disp: 90 tablet, Rfl: 1   cetirizine (ZYRTEC) 10 MG tablet, Take 10 mg by mouth every morning., Disp: , Rfl:    clopidogrel (PLAVIX) 75 MG tablet, Take 1 tablet (75 mg total) by mouth daily with breakfast., Disp: 90 tablet, Rfl: 1   cyanocobalamin (VITAMIN B12) 1000 MCG tablet, Take 1,000 mcg by mouth at bedtime., Disp: , Rfl:    dapagliflozin propanediol (FARXIGA) 10 MG TABS tablet, Take 10 mg by mouth every morning., Disp: , Rfl:    ferrous sulfate 325 (65 FE) MG tablet, Take 325 mg by mouth at bedtime., Disp: , Rfl:    fluticasone (FLONASE) 50 MCG/ACT nasal spray, Place 2 sprays into both nostrils 2 (two) times daily., Disp: , Rfl:    ipratropium (ATROVENT) 0.03 % nasal spray, Place 2 sprays into both nostrils 4 (four) times daily., Disp: , Rfl:    losartan (COZAAR) 25 MG tablet, Take 1 tablet (25 mg total) by mouth daily., Disp: 90 tablet, Rfl: 3   metFORMIN (GLUCOPHAGE) 1000 MG tablet, Take 1,000 mg by mouth 2 (two) times daily with a meal., Disp: , Rfl:    montelukast  (SINGULAIR) 10 MG tablet, Take 10 mg by mouth at bedtime., Disp: , Rfl:    Multiple Vitamins-Minerals (MULTIVITAMIN WITH MINERALS) tablet, Take 1 tablet by mouth every morning., Disp: , Rfl:    naphazoline-glycerin (CLEAR EYES REDNESS) 0.012-0.25 % SOLN, Place 1-2 drops into both eyes 4 (four) times daily as needed for eye irritation., Disp: , Rfl:    nitroGLYCERIN (NITROSTAT) 0.4 MG SL tablet, Place 1 tablet (0.4 mg total) under the tongue every 5 (five) minutes as needed for chest pain., Disp: 25 tablet, Rfl: 3   pantoprazole (PROTONIX) 40 MG tablet, Take 1 tablet (40 mg total) by mouth daily., Disp: 90 tablet, Rfl: 3   polycarbophil (FIBERCON) 625 MG tablet, Take 1,250 mg by mouth 2 (two) times daily., Disp: , Rfl:    sertraline (ZOLOFT) 50 MG tablet, Take 50 mg by mouth every morning., Disp: , Rfl:    sucralfate (CARAFATE) 1 g tablet, Take 1 g by mouth daily as needed (Stomach acid)., Disp: , Rfl:    tadalafil (CIALIS) 5 MG tablet, Take 5 mg by mouth at bedtime., Disp: , Rfl:    tirzepatide (MOUNJARO) 10 MG/0.5ML Pen, Inject 10 mg into the skin once a week., Disp: , Rfl:   Past Medical History: Past Medical History:  Diagnosis Date   Acute torn meniscus of knee, right, sequela    loose bodies also   Allergic  rhinitis    Anxiety    Benign localized prostatic hyperplasia with lower urinary tract symptoms (LUTS)    urologist--- dr Elvera Lennox. [facility]   Bladder outlet obstruction    DM type 2 (diabetes mellitus, type 2) (HCC)    followed by pcp   (08-07-2021 per pt checks blood dialy in am,  fasting sugar-- 114--125)   GERD (gastroesophageal reflux disease)    History of COVID-19 05/2021   per pt positive home test , mild to moderate symptoms,  all symptoms resolved with exception occasional dry cough but could be from allergies   Hyperlipemia    IDA (iron deficiency anemia)    on iron po   OA (osteoarthritis)    OSA on CPAP    PER PT USES NIGHTLY   Tightness in chest    occ with exertion  riding mountain bike;  pt referred to cardiology for evaluation ,  dr g. Jacques Navy , lov note in epic 07-15-2021 (nuclear stress test 06-02-2021 (epic) showed low risk with no ischemia, nuclear ef 50%, hypertensive response with exercise) and echo in care everywhere 07-05-2020 ef 55-60% mild MR   Wears glasses     Tobacco Use: Social History   Tobacco Use  Smoking Status Never  Smokeless Tobacco Never    Labs: Review Flowsheet       06/02/2023  Labs for ITP Cardiac and Pulmonary Rehab  Hemoglobin A1c 6.7        Details       This result is from an external source.         Capillary Blood Glucose: Lab Results  Component Value Date   GLUCAP 211 (H) 10/18/2023   GLUCAP 137 (H) 10/13/2023   GLUCAP 247 (H) 10/13/2023   GLUCAP 198 (H) 10/08/2023   GLUCAP 188 (H) 09/08/2023     Exercise Target Goals: Exercise Program Goal: Individual exercise prescription set using results from initial 6 min walk test and THRR while considering  patient's activity barriers and safety.   Exercise Prescription Goal: Initial exercise prescription builds to 30-45 minutes a day of aerobic activity, 2-3 days per week.  Home exercise guidelines will be given to patient during program as part of exercise prescription that the participant will acknowledge.  Activity Barriers & Risk Stratification:  Activity Barriers & Cardiac Risk Stratification - 09/30/23 1009       Activity Barriers & Cardiac Risk Stratification   Activity Barriers Arthritis;Joint Problems    Cardiac Risk Stratification High   <5 METs on            6 Minute Walk:  6 Minute Walk     Row Name 09/30/23 1106         6 Minute Walk   Phase Initial     Distance 2040 feet     Walk Time 6 minutes     # of Rest Breaks 0     MPH 3.86     METS 4.38     RPE 9     Perceived Dyspnea  0     VO2 Peak 15.31     Symptoms No     Resting HR 80 bpm     Resting BP 96/58  recheck 112/60     Resting Oxygen Saturation  94 %      Exercise Oxygen Saturation  during 6 min walk 97 %     Max Ex. HR 121 bpm     Max Ex. BP 130/66  2 Minute Post BP 108/64              Oxygen Initial Assessment:   Oxygen Re-Evaluation:   Oxygen Discharge (Final Oxygen Re-Evaluation):   Initial Exercise Prescription:  Initial Exercise Prescription - 09/30/23 1100       Date of Initial Exercise RX and Referring Provider   Date 09/30/23    Referring Provider Weston Brass, MD    Expected Discharge Date 12/22/23      Bike   Level 1    Watts 50    Minutes 15    METs 3      Recumbant Elliptical   Level 1    RPM 75    Watts 90    Minutes 15    METs 3      Prescription Details   Frequency (times per week) 3    Duration Progress to 30 minutes of continuous aerobic without signs/symptoms of physical distress      Intensity   THRR 40-80% of Max Heartrate 62-124    Ratings of Perceived Exertion 11-13    Perceived Dyspnea 0-4      Progression   Progression Continue progressive overload as per policy without signs/symptoms or physical distress.      Resistance Training   Training Prescription Yes    Weight 3    Reps 10-15             Perform Capillary Blood Glucose checks as needed.  Exercise Prescription Changes:   Exercise Prescription Changes     Row Name 10/08/23 1646 10/25/23 1700 11/08/23 1500 11/12/23 1600 11/22/23 1400     Response to Exercise   Blood Pressure (Admit) 116/64 126/58 124/72 110/60 106/58   Blood Pressure (Exercise) 138/78 134/62 -- -- --   Blood Pressure (Exit) 110/68 108/62 98/60 102/56 104/72   Heart Rate (Admit) 72 bpm 93 bpm 102 bpm 98 bpm 78 bpm   Heart Rate (Exercise) 133 bpm 124 bpm 144 bpm 135 bpm 143 bpm   Heart Rate (Exit) 79 bpm 97 bpm 111 bpm 106 bpm 86 bpm   Rating of Perceived Exertion (Exercise) 10 12 11 11 12    Perceived Dyspnea (Exercise) 0 -- -- -- --   Symptoms 0 -- -- None None   Comments Pt first day in the [facility] program Reviewed METs  Reviewed METs and goals Reviewed HERx Reviewed METs   Duration Progress to 30 minutes of  aerobic without signs/symptoms of physical distress Continue with 30 min of aerobic exercise without signs/symptoms of physical distress. Continue with 30 min of aerobic exercise without signs/symptoms of physical distress. Continue with 30 min of aerobic exercise without signs/symptoms of physical distress. Continue with 30 min of aerobic exercise without signs/symptoms of physical distress.   Intensity THRR unchanged THRR unchanged THRR unchanged THRR unchanged THRR unchanged     Progression   Progression Continue to progress workloads to maintain intensity without signs/symptoms of physical distress. Continue to progress workloads to maintain intensity without signs/symptoms of physical distress. Continue to progress workloads to maintain intensity without signs/symptoms of physical distress. Continue to progress workloads to maintain intensity without signs/symptoms of physical distress. Continue to progress workloads to maintain intensity without signs/symptoms of physical distress.   Average METs 4.15 4.7 4.9 4.35 5.35     Resistance Training   Training Prescription Yes Yes Yes Yes Yes   Weight 3 3 lbs 3 lbs 4 lbs 5 lbs   Reps 10-15 10-15 10-15 10-15 10-15  Time 10 Minutes 10 Minutes 10 Minutes 10 Minutes 10 Minutes     Interval Training   Interval Training -- No No No No     Bike   Level 1 4 4 4  4.5   Watts 33 71 55 48 74   Minutes 15 15 15 15 15    METs 3.1 4.5 3.9 3.7 4.7     Recumbant Elliptical   Level 1 5 7 8 8    RPM 92 58 71 61 73   Watts 128 91 123 112 140   Minutes 15 15 15 15 15    METs 5.2 4.9 5.5 5 6      Home Exercise Plan   Plans to continue exercise at -- -- -- [employer] (comment) [employer] (comment)   Frequency -- -- -- Add 2 additional days to program exercise sessions. Add 2 additional days to program exercise sessions.   Initial Home Exercises Provided --  -- -- 11/12/23 11/12/23    Row Name 12/10/23 1500             Response to Exercise   Blood Pressure (Admit) 104/68       Blood Pressure (Exit) 106/68       Heart Rate (Admit) 101 bpm       Heart Rate (Exercise) 144 bpm       Heart Rate (Exit) 110 bpm       Rating of Perceived Exertion (Exercise) 1.56       Symptoms None       Comments Reviewed METs and goals       Duration Continue with 30 min of aerobic exercise without signs/symptoms of physical distress.       Intensity THRR unchanged         Progression   Progression Continue to progress workloads to maintain intensity without signs/symptoms of physical distress.       Average METs 4.9         Resistance Training   Training Prescription Yes       Weight 5 lbs       Reps 10-15       Time 10 Minutes         Interval Training   Interval Training No         Bike   Level 4.5       Watts 29       Minutes 15       METs 4.5         Recumbant Elliptical   Level 8       RPM 66       Watts 119       Minutes 15       METs 5.3         Home Exercise Plan   Plans to continue exercise at [employer] (comment)       Frequency Add 2 additional days to program exercise sessions.       Initial Home Exercises Provided 11/12/23                Exercise Comments:   Exercise Comments     Row Name 10/08/23 1428 10/25/23 1712 11/08/23 1553 11/12/23 1638 11/22/23 1410   Exercise Comments Pt's first day in the [facility] program. Pt exercised without complaints. Reviewed METs. Pt is making progressing and increasing workloads. Reviewed METs and goals. Pt exceeds THR with exercise and we request for the patient to reduce workloads. I have a pending request with  Dr, Jacques Navy, for an increase in the [facility]. Reviewed home exercise Rx. P{t would like to start to exercise at the [facility] on his off days as his wife just finished pulmonary rehab and he would like to exercise with her. Pt will use bike or other seated machine , as well as  resistance training. Pt verbalized understanding of the HERx and was provied a copy. Reviewed METs. Pt making good progress. Increased on both modalities today.    Row Name 12/10/23 1500           Exercise Comments Reviewed METs and goals. Pt progress is still limited by his exceeding his THRR.                Exercise Goals and Review:   Exercise Goals     Row Name 09/30/23 0942             Exercise Goals   Increase Physical Activity Yes       Intervention Provide advice, education, support and counseling about physical activity/exercise needs.;Develop an individualized exercise prescription for aerobic and resistive training based on initial evaluation findings, risk stratification, comorbidities and participant's personal goals.       Expected Outcomes Short Term: Attend rehab on a regular basis to increase amount of physical activity.;Long Term: Exercising regularly at least 3-5 days a week.;Long Term: Add in home exercise to make exercise part of routine and to increase amount of physical activity.       Increase Strength and Stamina Yes       Intervention Provide advice, education, support and counseling about physical activity/exercise needs.;Develop an individualized exercise prescription for aerobic and resistive training based on initial evaluation findings, risk stratification, comorbidities and participant's personal goals.       Expected Outcomes Short Term: Increase workloads from initial exercise prescription for resistance, speed, and METs.;Short Term: Perform resistance training exercises routinely during rehab and add in resistance training at home;Long Term: Improve cardiorespiratory fitness, muscular endurance and strength as measured by increased METs and functional capacity ( )       Able to understand and use rate of perceived exertion (RPE) scale Yes       Intervention Provide education and explanation on how to use RPE scale       Expected Outcomes Short  Term: Able to use RPE daily in rehab to express subjective intensity level;Long Term:  Able to use RPE to guide intensity level when exercising independently       Knowledge and understanding of Target Heart Rate Range (THRR) Yes       Intervention Provide education and explanation of THRR including how the numbers were predicted and where they are located for reference       Expected Outcomes Short Term: Able to state/look up THRR;Long Term: Able to use THRR to govern intensity when exercising independently;Short Term: Able to use daily as guideline for intensity in rehab       Understanding of Exercise Prescription Yes       Intervention Provide education, explanation, and written materials on patient's individual exercise prescription       Expected Outcomes Short Term: Able to explain program exercise prescription;Long Term: Able to explain home exercise prescription to exercise independently                Exercise Goals Re-Evaluation :  Exercise Goals Re-Evaluation     Row Name 10/08/23 1427 11/08/23 1549 12/10/23 1500         Exercise  Goal Re-Evaluation   Exercise Goals Review Increase Physical Activity;Understanding of Exercise Prescription;Increase Strength and Stamina;Knowledge and understanding of Target Heart Rate Range (THRR);Able to understand and use rate of perceived exertion (RPE) scale Increase Physical Activity;Understanding of Exercise Prescription;Increase Strength and Stamina;Knowledge and understanding of Target Heart Rate Range (THRR);Able to understand and use rate of perceived exertion (RPE) scale Increase Physical Activity;Understanding of Exercise Prescription;Increase Strength and Stamina;Knowledge and understanding of Target Heart Rate Range (THRR);Able to understand and use rate of perceived exertion (RPE) scale     Comments Pt's first day in the CRP2 program. Pt understnads the  exercise RX, THRR, and RPE scale. Reviewed METs and goals. Pt voices some progress  on his goal of increased strength and stamina. His goal to build overall fitness is a work in progress. Reviewed METs and goals. Pt voices progress on his goal of increased strength and stamina as he has been able to increase workloads on his modalitites. His goal to build overall fitness is a work in progress.     Expected Outcomes Will continute to montior patient and progress exercise workloads as tolerated. Will continute to montior patient and progress exercise workloads as tolerated. Will continute to montior patient and progress exercise workloads as tolerated.              Discharge Exercise Prescription (Final Exercise Prescription Changes):  Exercise Prescription Changes - 12/10/23 1500       Response to Exercise   Blood Pressure (Admit) 104/68    Blood Pressure (Exit) 106/68    Heart Rate (Admit) 101 bpm    Heart Rate (Exercise) 144 bpm    Heart Rate (Exit) 110 bpm    Rating of Perceived Exertion (Exercise) 1.56    Symptoms None    Comments Reviewed METs and goals    Duration Continue with 30 min of aerobic exercise without signs/symptoms of physical distress.    Intensity THRR unchanged      Progression   Progression Continue to progress workloads to maintain intensity without signs/symptoms of physical distress.    Average METs 4.9      Resistance Training   Training Prescription Yes    Weight 5 lbs    Reps 10-15    Time 10 Minutes      Interval Training   Interval Training No      Bike   Level 4.5    Watts 29    Minutes 15    METs 4.5      Recumbant Elliptical   Level 8    RPM 66    Watts 119    Minutes 15    METs 5.3      Home Exercise Plan   Plans to continue exercise at [employer] (comment)    Frequency Add 2 additional days to program exercise sessions.    Initial Home Exercises Provided 11/12/23             Nutrition:  Target Goals: Understanding of nutrition guidelines, daily intake of sodium 1500mg , cholesterol 200mg ,  calories 30% from fat and 7% or less from saturated fats, daily to have 5 or more servings of fruits and vegetables.  Biometrics:  Pre Biometrics - 09/30/23 0939       Pre Biometrics   Waist Circumference 45 inches    Hip Circumference 435.5 inches    Waist to Hip Ratio 0.1 %    Triceps Skinfold 9 mm    % Body Fat 29.7 %    Grip  Strength 38 kg    Flexibility 11 in    Single Leg Stand 30 seconds              Nutrition Therapy Plan and Nutrition Goals:  Nutrition Therapy & Goals - 10/08/23 1407       Nutrition Therapy   Diet Heart Healthy Diet    Drug/Food Interactions Statins/Certain Fruits      Personal Nutrition Goals   Nutrition Goal Patient to identify strategies for reducing cardiovascular risk by attending the [facility] education and nutrition series weekly.    Personal Goal #2 Patient to improve diet quality by using the plate method as a guide for meal planning to include lean protein/plant protein, fruits, vegetables, whole grains, nonfat dairy as part of a well-balanced diet.    Personal Goal #3 Patient to limit sodium to 2300mg  per day    Comments Quron has medical history of CAD, Hyperlipidemia, DM2, OSA, HTN, s/p coronary artery stent. He is taking mounjaro to aid with weight loss, blood sugar control. LDL remains at goal <70 (65). His wife is motivated to make lifestyle changes as well. Patient will benefit from participation in intensive cardiac rehab for nutrition, exercise, and lifestyle modification.      Intervention Plan   Intervention Prescribe, educate and counsel regarding individualized specific dietary modifications aiming towards targeted core components such as weight, hypertension, lipid management, diabetes, heart failure and other comorbidities.;Nutrition handout(s) given to patient.    Expected Outcomes Short Term Goal: Understand basic principles of dietary content, such as calories, fat, sodium, cholesterol and nutrients.;Long Term Goal: Adherence  to prescribed nutrition plan.             Nutrition Assessments:  MEDIFICTS Score Key: >=70 Need to make dietary changes  40-70 Heart Healthy Diet <= 40 Therapeutic Level Cholesterol Diet   Flowsheet Row INTENSIVE CARDIAC REHAB from 10/27/2023 in [facility] for Heart, Vascular, & Lung Health  Picture Your Plate Total Score on Admission 50      Picture Your Plate Scores: <09 Unhealthy dietary pattern with much room for improvement. 41-50 Dietary pattern unlikely to meet recommendations for good health and room for improvement. 51-60 More healthful dietary pattern, with some room for improvement.  >60 Healthy dietary pattern, although there may be some specific behaviors that could be improved.    Nutrition Goals Re-Evaluation:  Nutrition Goals Re-Evaluation     Row Name 10/08/23 1407             Goals   Current Weight 199 lb 8.3 oz (90.5 kg)       Comment LDL 65, triglycerides 154, lipoproteinA WNL, A1c 6.7       Expected Outcome Brice has medical history of CAD, Hyperlipidemia, DM2, OSA, HTN, s/p coronary artery stent. He is taking mounjaro to aid with weight loss, blood sugar control. LDL remains at goal <70 (65). His wife is motivated to make lifestyle changes as well. Patient will benefit from participation in intensive cardiac rehab for nutrition, exercise, and lifestyle modification.                Nutrition Goals Re-Evaluation:  Nutrition Goals Re-Evaluation     Row Name 10/08/23 1407             Goals   Current Weight 199 lb 8.3 oz (90.5 kg)       Comment LDL 65, triglycerides 154, lipoproteinA WNL, A1c 6.7       Expected Outcome Seddrick has  medical history of CAD, Hyperlipidemia, DM2, OSA, HTN, s/p coronary artery stent. He is taking mounjaro to aid with weight loss, blood sugar control. LDL remains at goal <70 (65). His wife is motivated to make lifestyle changes as well. Patient will benefit from participation in intensive  cardiac rehab for nutrition, exercise, and lifestyle modification.                Nutrition Goals Discharge (Final Nutrition Goals Re-Evaluation):  Nutrition Goals Re-Evaluation - 10/08/23 1407       Goals   Current Weight 199 lb 8.3 oz (90.5 kg)    Comment LDL 65, triglycerides 154, lipoproteinA WNL, A1c 6.7    Expected Outcome Gracin has medical history of CAD, Hyperlipidemia, DM2, OSA, HTN, s/p coronary artery stent. He is taking mounjaro to aid with weight loss, blood sugar control. LDL remains at goal <70 (65). His wife is motivated to make lifestyle changes as well. Patient will benefit from participation in intensive cardiac rehab for nutrition, exercise, and lifestyle modification.             Psychosocial: Target Goals: Acknowledge presence or absence of significant depression and/or stress, maximize coping skills, provide positive support system. Participant is able to verbalize types and ability to use techniques and skills needed for reducing stress and depression.  Initial Review & Psychosocial Screening:  Initial Psych Review & Screening - 09/30/23 1009       Initial Review   Current issues with None Identified;Current Psychotropic Meds      Family Dynamics   Good Support System? Yes   Kijana has his wife for support   Comments Rana denies any current feelings of anxiety/depression/stress. He is taking Zoloft and feels it is working well for him.      Barriers   Psychosocial barriers to participate in program There are no identifiable barriers or psychosocial needs.      Screening Interventions   Interventions Encouraged to exercise;Provide feedback about the scores to participant    Expected Outcomes Short Term goal: Identification and review with participant of any Quality of Life or Depression concerns found by scoring the questionnaire.;Long Term goal: The participant improves quality of Life and PHQ9 Scores as seen by post scores and/or verbalization of  changes             Quality of Life Scores:  Quality of Life - 09/30/23 1022       Quality of Life   Select Quality of Life      Quality of Life Scores   Health/Function Pre 28.23 %    Socioeconomic Pre 28.57 %    Psych/Spiritual Pre 30 %    Family Pre 30 %    GLOBAL Pre 28.93 %            Scores of 19 and below usually indicate a poorer quality of life in these areas.  A difference of  2-3 points is a clinically meaningful difference.  A difference of 2-3 points in the total score of the Quality of Life Index has been associated with significant improvement in overall quality of life, self-image, physical symptoms, and general health in studies assessing change in quality of life.  PHQ-9: Review Flowsheet       09/30/2023  Depression screen PHQ 2/9  Decreased Interest 0  Down, Depressed, Hopeless 0  PHQ - 2 Score 0  Altered sleeping 1  Tired, decreased energy 1  Change in appetite 0  Feeling bad or failure about  yourself  0  Trouble concentrating 0  Moving slowly or fidgety/restless 0  Suicidal thoughts 0  PHQ-9 Score 2  Difficult doing work/chores Not difficult at all   Interpretation of Total Score  Total Score Depression Severity:  1-4 = Minimal depression, 5-9 = Mild depression, 10-14 = Moderate depression, 15-19 = Moderately severe depression, 20-27 = Severe depression   Psychosocial Evaluation and Intervention:   Psychosocial Re-Evaluation:  Psychosocial Re-Evaluation     Row Name 10/12/23 0806 10/28/23 0845 11/22/23 1627 12/17/23 1101       Psychosocial Re-Evaluation   Current issues with None Identified;Current Psychotropic Meds None Identified;Current Psychotropic Meds None Identified;Current Psychotropic Meds None Identified;Current Psychotropic Meds    Comments Zackary did not voice any concerns or stressors on his first day of exercise. Discussed PHQ2-9 on 10/25/23. No concerns or stressors noted at this time. -- --    Interventions Relaxation  education;Encouraged to attend Cardiac Rehabilitation for the exercise;Stress management education Relaxation education;Encouraged to attend Cardiac Rehabilitation for the exercise;Stress management education Relaxation education;Encouraged to attend Cardiac Rehabilitation for the exercise;Stress management education Relaxation education;Encouraged to attend Cardiac Rehabilitation for the exercise;Stress management education    Continue Psychosocial Services  No Follow up required No Follow up required No Follow up required No Follow up required             Psychosocial Discharge (Final Psychosocial Re-Evaluation):  Psychosocial Re-Evaluation - 12/17/23 1101       Psychosocial Re-Evaluation   Current issues with None Identified;Current Psychotropic Meds    Interventions Relaxation education;Encouraged to attend Cardiac Rehabilitation for the exercise;Stress management education    Continue Psychosocial Services  No Follow up required             Vocational Rehabilitation: Provide vocational rehab assistance to qualifying candidates.   Vocational Rehab Evaluation & Intervention:  Vocational Rehab - 09/30/23 1011       Initial Vocational Rehab Evaluation & Intervention   Assessment shows need for Vocational Rehabilitation No   Oree is retired            Education: Education Goals: Education classes will be provided on a weekly basis, covering required topics. Participant will state understanding/return demonstration of topics presented.    Education     Row Name 10/08/23 1500     Education   Cardiac Education Topics Pritikin   Licensed conveyancer Nutrition   Nutrition Overview of the Pritikin Eating Plan   Instruction Review Code 1- Verbalizes Understanding   Class Start Time 1400   Class Stop Time 1445   Class Time Calculation (min) 45 min    Row Name 10/13/23 1400     Education   Cardiac Education Topics  Pritikin   Nurse, children's   Educator Nurse   Nutrition Becoming a Pritikin Chef   Instruction Review Code 1- Verbalizes Understanding   Class Start Time 1356    Row Name 10/18/23 1400     Education   Cardiac Education Topics Pritikin   Select Core Videos     Core Videos   Educator Exercise Physiologist   Select Nutrition   Nutrition Other  Label Reading   Instruction Review Code 1- Verbalizes Understanding   Class Start Time 1356   Class Stop Time 1430   Class Time Calculation (min) 34 min    Row Name 10/25/23 1600  Education   Cardiac Education Topics Pritikin   Geographical information systems officer Psychosocial   Psychosocial Workshop Recognizing and Reducing Stress   Instruction Review Code 1- Verbalizes Understanding   Class Start Time 1359   Class Stop Time 1445   Class Time Calculation (min) 46 min    Row Name 10/27/23 1500     Education   Cardiac Education Topics Pritikin   Orthoptist   Educator Dietitian   Weekly Topic Efficiency Cooking - Meals in a Snap   Instruction Review Code 1- Verbalizes Understanding   Class Start Time 1358   Class Stop Time 1438   Class Time Calculation (min) 40 min    Row Name 10/29/23 1500     Education   Cardiac Education Topics Pritikin   Nurse, children's   Educator Dietitian   Select Nutrition   Nutrition Calorie Density   Instruction Review Code 1- Verbalizes Understanding   Class Start Time 1406   Class Stop Time 1501   Class Time Calculation (min) 55 min    Row Name 11/01/23 1500     Education   Cardiac Education Topics Pritikin   Geographical information systems officer Exercise   Exercise Workshop Exercise Basics: Diplomatic Services operational officer   Instruction Review Code 1- Verbalizes Understanding   Class Start Time 1400   Class Stop Time 1450   Class Time  Calculation (min) 50 min    Row Name 11/03/23 1500     Education   Cardiac Education Topics Pritikin   Orthoptist   Educator Dietitian   Weekly Topic One-Pot Wonders   Instruction Review Code 1- Verbalizes Understanding   Class Start Time 1359   Class Stop Time 1436   Class Time Calculation (min) 37 min    Row Name 11/05/23 1500     Education   Cardiac Education Topics Pritikin   Psychologist, forensic Exercise Education   Exercise Education Move It!   Instruction Review Code 1- Verbalizes Understanding   Class Start Time 1405   Class Stop Time 1440   Class Time Calculation (min) 35 min    Row Name 11/08/23 1400     Education   Cardiac Education Topics Pritikin   Hospital doctor Education   General Education Hypertension and Heart Disease   Instruction Review Code 1- Verbalizes Understanding   Class Start Time 1400   Class Stop Time 1435   Class Time Calculation (min) 35 min    Row Name 11/12/23 1400     Education   Cardiac Education Topics Pritikin   Glass blower/designer Nutrition   Nutrition Workshop Targeting Your Nutrition Priorities   Instruction Review Code 1- Verbalizes Understanding   Class Start Time 1357   Class Stop Time 1436   Class Time Calculation (min) 39 min    Row Name 11/19/23 1400     Education   Cardiac Education Topics Pritikin   Licensed conveyancer  Nutrition   Nutrition Dining Out - Part 1   Instruction Review Code 1- Verbalizes Understanding   Class Start Time 1358   Class Stop Time 1442   Class Time Calculation (min) 44 min    Row Name 11/22/23 1400     Education   Cardiac Education Topics Pritikin   Psychologist, forensic  Exercise Education   Exercise Education Biomechanial Limitations   Instruction Review Code 1- Verbalizes Understanding   Class Start Time 1400   Class Stop Time 1440   Class Time Calculation (min) 40 min    Row Name 11/24/23 1500     Education   Cardiac Education Topics Pritikin   Customer service manager   Weekly Topic Comforting Weekend Breakfasts   Instruction Review Code 1- Verbalizes Understanding   Class Start Time 1400   Class Stop Time 1440   Class Time Calculation (min) 40 min    Row Name 12/01/23 1600     Education   Cardiac Education Topics Pritikin   Customer service manager   Weekly Topic Fast Evening Meals   Instruction Review Code 1- Verbalizes Understanding   Class Start Time 1400   Class Stop Time 1441   Class Time Calculation (min) 41 min    Row Name 12/03/23 1400     Education   Cardiac Education Topics Pritikin   Glass blower/designer Nutrition   Nutrition Workshop Fueling a Forensic psychologist   Instruction Review Code 1- Tax inspector   Class Start Time 1400   Class Stop Time 1432   Class Time Calculation (min) 32 min    Row Name 12/06/23 1500     Education   Cardiac Education Topics Pritikin   Geographical information systems officer Psychosocial   Psychosocial Workshop Healthy Sleep for a Healthy Heart   Instruction Review Code 1- Verbalizes Understanding   Class Start Time 1400   Class Stop Time 1445   Class Time Calculation (min) 45 min    Row Name 12/10/23 1400     Education   Cardiac Education Topics Pritikin   Nurse, children's Exercise Physiologist   Select Psychosocial   Psychosocial How Our Thoughts Can Heal Our Hearts   Instruction Review Code 1- Verbalizes Understanding   Class Start Time 1358   Class Stop Time 1435   Class Time  Calculation (min) 37 min    Row Name 12/13/23 1500     Education   Cardiac Education Topics Pritikin   Select Workshops     Workshops   Educator Exercise Physiologist   Select Exercise   Exercise Workshop Managing Heart Disease: Your Path to a Healthier Heart   Instruction Review Code 1- Verbalizes Understanding   Class Start Time 1355   Class Stop Time 1445   Class Time Calculation (min) 50 min    Row Name 12/15/23 1500     Education   Cardiac Education Topics Pritikin   Customer service manager   Weekly Topic Simple Sides and Sauces   Instruction Review Code 1- Verbalizes Understanding   Class Start Time  1145   Class Stop Time 1226   Class Time Calculation (min) 41 min            Core Videos: Exercise    Move It!  Clinical staff conducted group or individual video education with verbal and written material and guidebook.  Patient learns the recommended Pritikin exercise program. Exercise with the goal of living a long, healthy life. Some of the health benefits of exercise include controlled diabetes, healthier blood pressure levels, improved cholesterol levels, improved heart and lung capacity, improved sleep, and better body composition. Everyone should speak with their doctor before starting or changing an exercise routine.  Biomechanical Limitations Clinical staff conducted group or individual video education with verbal and written material and guidebook.  Patient learns how biomechanical limitations can impact exercise and how we can mitigate and possibly overcome limitations to have an impactful and balanced exercise routine.  Body Composition Clinical staff conducted group or individual video education with verbal and written material and guidebook.  Patient learns that body composition (ratio of muscle mass to fat mass) is a key component to assessing overall fitness, rather than body weight alone. Increased fat mass,  especially visceral belly fat, can put [place] at increased risk for metabolic syndrome, type 2 diabetes, heart disease, and even death. It is recommended to combine diet and exercise (cardiovascular and resistance training) to improve your body composition. Seek guidance from your physician and exercise physiologist before implementing an exercise routine.  Exercise Action Plan Clinical staff conducted group or individual video education with verbal and written material and guidebook.  Patient learns the recommended strategies to achieve and enjoy long-term exercise adherence, including variety, self-motivation, self-efficacy, and positive decision making. Benefits of exercise include fitness, good health, weight management, more energy, better sleep, less stress, and overall well-being.  Medical   Heart Disease Risk Reduction Clinical staff conducted group or individual video education with verbal and written material and guidebook.  Patient learns our heart is our most vital organ as it circulates oxygen, nutrients, white blood cells, and hormones throughout the entire body, and carries waste away. Data supports a plant-based eating plan like the Pritikin Program for its effectiveness in slowing progression of and reversing heart disease. The video provides a number of recommendations to address heart disease.   Metabolic Syndrome and Belly Fat  Clinical staff conducted group or individual video education with verbal and written material and guidebook.  Patient learns what metabolic syndrome is, how it leads to heart disease, and how one can reverse it and keep it from coming back. You have metabolic syndrome if you have 3 of the following 5 criteria: abdominal obesity, high blood pressure, high triglycerides, low HDL cholesterol, and high blood sugar.  Hypertension and Heart Disease Clinical staff conducted group or individual video education with verbal and written material and guidebook.  Patient  learns that high blood pressure, or hypertension, is very common in the [place]. Hypertension is largely due to excessive salt intake, but other important risk factors include being overweight, physical inactivity, drinking too much alcohol, smoking, and not eating enough potassium from fruits and vegetables. High blood pressure is a leading risk factor for heart attack, stroke, congestive heart failure, dementia, kidney failure, and premature death. Long-term effects of excessive salt intake include stiffening of the arteries and thickening of heart muscle and organ damage. Recommendations include ways to reduce hypertension and the risk of heart disease.  Diseases of Our Time - Focusing on Diabetes Clinical staff  conducted group or individual video education with verbal and written material and guidebook.  Patient learns why the best way to stop diseases of our time is prevention, through food and other lifestyle changes. Medicine (such as prescription pills and surgeries) is often only a Band-Aid on the problem, not a long-term solution. Most common diseases of our time include obesity, type 2 diabetes, hypertension, heart disease, and cancer. The Pritikin Program is recommended and has been proven to help reduce, reverse, and/or prevent the damaging effects of metabolic syndrome.  Nutrition   Overview of the Pritikin Eating Plan  Clinical staff conducted group or individual video education with verbal and written material and guidebook.  Patient learns about the Pritikin Eating Plan for disease risk reduction. The Pritikin Eating Plan emphasizes a wide variety of unrefined, minimally-processed carbohydrates, like fruits, vegetables, whole grains, and legumes. Go, Caution, and Stop food choices are explained. Plant-based and lean animal proteins are emphasized. Rationale provided for low sodium intake for blood pressure control, low added sugars for blood sugar stabilization, and low added fats and  oils for coronary artery disease risk reduction and weight management.  Calorie Density  Clinical staff conducted group or individual video education with verbal and written material and guidebook.  Patient learns about calorie density and how it impacts the Pritikin Eating Plan. Knowing the characteristics of the food you choose will help you decide whether those foods will lead to weight gain or weight loss, and whether you want to consume more or less of them. Weight loss is usually a side effect of the Pritikin Eating Plan because of its focus on low calorie-dense foods.  Label Reading  Clinical staff conducted group or individual video education with verbal and written material and guidebook.  Patient learns about the Pritikin recommended label reading guidelines and corresponding recommendations regarding calorie density, added sugars, sodium content, and whole grains.  Dining Out - Part 1  Clinical staff conducted group or individual video education with verbal and written material and guidebook.  Patient learns that restaurant meals can be sabotaging because they can be so high in calories, fat, sodium, and/or sugar. Patient learns recommended strategies on how to positively address this and avoid unhealthy pitfalls.  Facts on Fats  Clinical staff conducted group or individual video education with verbal and written material and guidebook.  Patient learns that lifestyle modifications can be just as effective, if not more so, as many medications for lowering your risk of heart disease. A Pritikin lifestyle can help to reduce your risk of inflammation and atherosclerosis (cholesterol build-up, or plaque, in the artery walls). Lifestyle interventions such as dietary choices and physical activity address the cause of atherosclerosis. A review of the types of fats and their impact on blood cholesterol levels, along with dietary recommendations to reduce fat intake is also included.  Nutrition  Action Plan  Clinical staff conducted group or individual video education with verbal and written material and guidebook.  Patient learns how to incorporate Pritikin recommendations into their lifestyle. Recommendations include planning and keeping personal health goals in mind as an important part of their success.  Healthy Mind-Set    Healthy Minds, Bodies, Hearts  Clinical staff conducted group or individual video education with verbal and written material and guidebook.  Patient learns how to identify when they are stressed. Video will discuss the impact of that stress, as well as the many benefits of stress management. Patient will also be introduced to stress management techniques. The way we  think, act, and feel has an impact on our hearts.  How Our Thoughts Can Heal Our Hearts  Clinical staff conducted group or individual video education with verbal and written material and guidebook.  Patient learns that negative thoughts can cause depression and anxiety. This can result in negative lifestyle behavior and serious health problems. Cognitive behavioral therapy is an effective method to help control our thoughts in order to change and improve our emotional outlook.  Additional Videos:  Exercise    Improving Performance  Clinical staff conducted group or individual video education with verbal and written material and guidebook.  Patient learns to use a non-linear approach by alternating intensity levels and lengths of time spent exercising to help burn more calories and lose more body fat. Cardiovascular exercise helps improve heart health, metabolism, hormonal balance, blood sugar control, and recovery from fatigue. Resistance training improves strength, endurance, balance, coordination, reaction time, metabolism, and muscle mass. Flexibility exercise improves circulation, posture, and balance. Seek guidance from your physician and exercise physiologist before implementing an exercise routine  and learn your capabilities and proper form for all exercise.  Introduction to Yoga  Clinical staff conducted group or individual video education with verbal and written material and guidebook.  Patient learns about yoga, a discipline of the coming together of mind, breath, and body. The benefits of yoga include improved flexibility, improved range of motion, better posture and core strength, increased lung function, weight loss, and positive self-image. Yoga's heart health benefits include lowered blood pressure, healthier heart rate, decreased cholesterol and triglyceride levels, improved immune function, and reduced stress. Seek guidance from your physician and exercise physiologist before implementing an exercise routine and learn your capabilities and proper form for all exercise.  Medical   Aging: Enhancing Your Quality of Life  Clinical staff conducted group or individual video education with verbal and written material and guidebook.  Patient learns key strategies and recommendations to stay in good physical health and enhance quality of life, such as prevention strategies, having an advocate, securing a Health Care Proxy and Power of Attorney, and keeping a list of medications and system for tracking them. It also discusses how to avoid risk for bone loss.  Biology of Weight Control  Clinical staff conducted group or individual video education with verbal and written material and guidebook.  Patient learns that weight gain occurs because we consume more calories than we burn (eating more, moving less). Even if your body weight is normal, you may have higher ratios of fat compared to muscle mass. Too much body fat puts you at increased risk for cardiovascular disease, heart attack, stroke, type 2 diabetes, and obesity-related cancers. In addition to exercise, following the Pritikin Eating Plan can help reduce your risk.  Decoding Lab Results  Clinical staff conducted group or individual video  education with verbal and written material and guidebook.  Patient learns that lab test reflects one measurement whose values change over time and are influenced by many factors, including medication, stress, sleep, exercise, food, hydration, pre-existing medical conditions, and more. It is recommended to use the knowledge from this video to become more involved with your lab results and evaluate your numbers to speak with your doctor.   Diseases of Our Time - Overview  Clinical staff conducted group or individual video education with verbal and written material and guidebook.  Patient learns that according to the CDC, 50% to 70% of chronic diseases (such as obesity, type 2 diabetes, elevated lipids, hypertension, and heart disease)  are avoidable through lifestyle improvements including healthier food choices, listening to satiety cues, and increased physical activity.  Sleep Disorders Clinical staff conducted group or individual video education with verbal and written material and guidebook.  Patient learns how good quality and duration of sleep are important to overall health and well-being. Patient also learns about sleep disorders and how they impact health along with recommendations to address them, including discussing with a physician.  Nutrition  Dining Out - Part 2 Clinical staff conducted group or individual video education with verbal and written material and guidebook.  Patient learns how to plan ahead and communicate in order to maximize their dining experience in a healthy and nutritious manner. Included are recommended food choices based on the type of restaurant the patient is visiting.   Fueling a Banker conducted group or individual video education with verbal and written material and guidebook.  There is a strong connection between our food choices and our health. Diseases like obesity and type 2 diabetes are very prevalent and are in large-part due to  lifestyle choices. The Pritikin Eating Plan provides plenty of food and hunger-curbing satisfaction. It is easy to follow, affordable, and helps reduce health risks.  Menu Workshop  Clinical staff conducted group or individual video education with verbal and written material and guidebook.  Patient learns that restaurant meals can sabotage health goals because they are often packed with calories, fat, sodium, and sugar. Recommendations include strategies to plan ahead and to communicate with the manager, chef, or server to help order a healthier meal.  Planning Your Eating Strategy  Clinical staff conducted group or individual video education with verbal and written material and guidebook.  Patient learns about the Pritikin Eating Plan and its benefit of reducing the risk of disease. The Pritikin Eating Plan does not focus on calories. Instead, it emphasizes high-quality, nutrient-rich foods. By knowing the characteristics of the foods, we choose, we can determine their calorie density and make informed decisions.  Targeting Your Nutrition Priorities  Clinical staff conducted group or individual video education with verbal and written material and guidebook.  Patient learns that lifestyle habits have a tremendous impact on disease risk and progression. This video provides eating and physical activity recommendations based on your personal health goals, such as reducing LDL cholesterol, losing weight, preventing or controlling type 2 diabetes, and reducing high blood pressure.  Vitamins and Minerals  Clinical staff conducted group or individual video education with verbal and written material and guidebook.  Patient learns different ways to obtain key vitamins and minerals, including through a recommended healthy diet. It is important to discuss all supplements you take with your doctor.   Healthy Mind-Set    Smoking Cessation  Clinical staff conducted group or individual video education with  verbal and written material and guidebook.  Patient learns that cigarette smoking and tobacco addiction pose a serious health risk which affects millions of people. Stopping smoking will significantly reduce the risk of heart disease, lung disease, and many forms of cancer. Recommended strategies for quitting are covered, including working with your doctor to develop a successful plan.  Culinary   Becoming a Set designer conducted group or individual video education with verbal and written material and guidebook.  Patient learns that cooking at home can be healthy, cost-effective, quick, and puts them in control. Keys to cooking healthy recipes will include looking at your recipe, assessing your equipment needs, planning ahead, making it simple, choosing  cost-effective seasonal ingredients, and limiting the use of added fats, salts, and sugars.  Cooking - Breakfast and Snacks  Clinical staff conducted group or individual video education with verbal and written material and guidebook.  Patient learns how important breakfast is to satiety and nutrition through the entire day. Recommendations include key foods to eat during breakfast to help stabilize blood sugar levels and to prevent overeating at meals later in the day. Planning ahead is also a key component.  Cooking - Educational psychologist conducted group or individual video education with verbal and written material and guidebook.  Patient learns eating strategies to improve overall health, including an approach to cook more at home. Recommendations include thinking of animal protein as a side on your plate rather than center stage and focusing instead on lower calorie dense options like vegetables, fruits, whole grains, and plant-based proteins, such as beans. Making sauces in large quantities to freeze for later and leaving the skin on your vegetables are also recommended to maximize your experience.  Cooking - Healthy  Salads and Dressing Clinical staff conducted group or individual video education with verbal and written material and guidebook.  Patient learns that vegetables, fruits, whole grains, and legumes are the foundations of the Pritikin Eating Plan. Recommendations include how to incorporate each of these in flavorful and healthy salads, and how to create homemade salad dressings. Proper handling of ingredients is also covered. Cooking - Soups and [employer] - Soups and Desserts Clinical staff conducted group or individual video education with verbal and written material and guidebook.  Patient learns that Pritikin soups and desserts make for easy, nutritious, and delicious snacks and meal components that are low in sodium, fat, sugar, and calorie density, while high in vitamins, minerals, and filling fiber. Recommendations include simple and healthy ideas for soups and desserts.   Overview     The Pritikin Solution Program Overview Clinical staff conducted group or individual video education with verbal and written material and guidebook.  Patient learns that the results of the Pritikin Program have been documented in more than 100 articles published in peer-reviewed journals, and the benefits include reducing risk factors for (and, in some cases, even reversing) high cholesterol, high blood pressure, type 2 diabetes, obesity, and more! An overview of the three key pillars of the Pritikin Program will be covered: eating well, doing regular exercise, and having a healthy mind-set.  WORKSHOPS  Exercise: Exercise Basics: Building Your Action Plan Clinical staff led group instruction and group discussion with PowerPoint presentation and patient guidebook. To enhance the learning environment the use of posters, models and videos may be added. At the conclusion of this workshop, patients will comprehend the difference between physical activity and exercise, as well as the benefits of  incorporating both, into their routine. Patients will understand the FITT (Frequency, Intensity, Time, and Type) principle and how to use it to build an exercise action plan. In addition, safety concerns and other considerations for exercise and cardiac rehab will be addressed by the presenter. The purpose of this lesson is to promote a comprehensive and effective weekly exercise routine in order to improve patients' overall level of fitness.   Managing Heart Disease: Your Path to a Healthier Heart Clinical staff led group instruction and group discussion with PowerPoint presentation and patient guidebook. To enhance the learning environment the use of posters, models and videos may be added.At the conclusion of this workshop, patients will understand the anatomy and physiology of  the heart. Additionally, they will understand how Pritikin's three pillars impact the risk factors, the progression, and the management of heart disease.  The purpose of this lesson is to provide a high-level overview of the heart, heart disease, and how the Pritikin lifestyle positively impacts risk factors.  Exercise Biomechanics Clinical staff led group instruction and group discussion with PowerPoint presentation and patient guidebook. To enhance the learning environment the use of posters, models and videos may be added. Patients will learn how the structural parts of their bodies function and how these functions impact their daily activities, movement, and exercise. Patients will learn how to promote a neutral spine, learn how to manage pain, and identify ways to improve their physical movement in order to promote healthy living. The purpose of this lesson is to expose patients to common physical limitations that impact physical activity. Participants will learn practical ways to adapt and manage aches and pains, and to minimize their effect on regular exercise. Patients will learn how to maintain good posture  while sitting, walking, and lifting.  Balance Training and Fall Prevention  Clinical staff led group instruction and group discussion with PowerPoint presentation and patient guidebook. To enhance the learning environment the use of posters, models and videos may be added. At the conclusion of this workshop, patients will understand the importance of their sensorimotor skills (vision, proprioception, and the vestibular system) in maintaining their ability to balance as they age. Patients will apply a variety of balancing exercises that are appropriate for their current level of function. Patients will understand the common causes for poor balance, possible solutions to these problems, and ways to modify their physical environment in order to minimize their fall risk. The purpose of this lesson is to teach patients about the importance of maintaining balance as they age and ways to minimize their risk of falling.  WORKSHOPS   Nutrition:  Fueling a Ship broker led group instruction and group discussion with PowerPoint presentation and patient guidebook. To enhance the learning environment the use of posters, models and videos may be added. Patients will review the foundational principles of the Pritikin Eating Plan and understand what constitutes a serving size in each of the food groups. Patients will also learn Pritikin-friendly foods that are better choices when away from home and review make-ahead meal and snack options. Calorie density will be reviewed and applied to three nutrition priorities: weight maintenance, weight loss, and weight gain. The purpose of this lesson is to reinforce (in a group setting) the key concepts around what patients are recommended to eat and how to apply these guidelines when away from home by planning and selecting Pritikin-friendly options. Patients will understand how calorie density may be adjusted for different weight management goals.  Mindful  Eating  Clinical staff led group instruction and group discussion with PowerPoint presentation and patient guidebook. To enhance the learning environment the use of posters, models and videos may be added. Patients will briefly review the concepts of the Pritikin Eating Plan and the importance of low-calorie dense foods. The concept of mindful eating will be introduced as well as the importance of paying attention to internal hunger signals. Triggers for non-hunger eating and techniques for dealing with triggers will be explored. The purpose of this lesson is to provide patients with the opportunity to review the basic principles of the Pritikin Eating Plan, discuss the value of eating mindfully and how to measure internal cues of hunger and fullness using the Hunger Scale. Patients  will also discuss reasons for non-hunger eating and learn strategies to use for controlling emotional eating.  Targeting Your Nutrition Priorities Clinical staff led group instruction and group discussion with PowerPoint presentation and patient guidebook. To enhance the learning environment the use of posters, models and videos may be added. Patients will learn how to determine their genetic susceptibility to disease by reviewing their family history. Patients will gain insight into the importance of diet as part of an overall healthy lifestyle in mitigating the impact of genetics and other environmental insults. The purpose of this lesson is to provide patients with the opportunity to assess their personal nutrition priorities by looking at their family history, their own health history and current risk factors. Patients will also be able to discuss ways of prioritizing and modifying the Pritikin Eating Plan for their highest risk areas  Menu  Clinical staff led group instruction and group discussion with PowerPoint presentation and patient guidebook. To enhance the learning environment the use of posters, models and videos may  be added. Using menus brought in from [employer], or printed from [employer], patients will apply the Pritikin dining out guidelines that were presented in the [employer] video. Patients will also be able to practice these guidelines in a variety of provided scenarios. The purpose of this lesson is to provide patients with the opportunity to practice hands-on learning of the Pritikin Dining Out guidelines with actual menus and practice scenarios.  Label Reading Clinical staff led group instruction and group discussion with PowerPoint presentation and patient guidebook. To enhance the learning environment the use of posters, models and videos may be added. Patients will review and discuss the Pritikin label reading guidelines presented in Pritikin's Label Reading Educational series video. Using fool labels brought in from local grocery stores and markets, patients will apply the label reading guidelines and determine if the packaged food meet the Pritikin guidelines. The purpose of this lesson is to provide patients with the opportunity to review, discuss, and practice hands-on learning of the Pritikin Label Reading guidelines with actual packaged food labels. Cooking School  Pritikin's [employer] are designed to teach patients ways to prepare quick, simple, and affordable recipes at home. The importance of nutrition's role in chronic disease risk reduction is reflected in its emphasis in the overall Pritikin program. By learning how to prepare essential core Pritikin Eating Plan recipes, patients will increase control over what they eat; be able to customize the flavor of foods without the use of added salt, sugar, or fat; and improve the quality of the food they consume. By learning a set of core recipes which are easily assembled, quickly prepared, and affordable, patients are more likely to prepare more healthy foods at home. These workshops focus on convenient  breakfasts, simple entres, side dishes, and desserts which can be prepared with minimal effort and are consistent with nutrition recommendations for cardiovascular risk reduction. Cooking [employer] are taught by a Armed forces logistics/support/administrative officer (RD) who has been trained by the [employer]. The chef or RD has a clear understanding of the importance of minimizing - if not completely eliminating - added fat, sugar, and sodium in recipes. Throughout the series of Cooking School Workshop sessions, patients will learn about healthy ingredients and efficient methods of cooking to build confidence in their capability to prepare    Cooking School weekly topics:  Adding Flavor- Sodium-Free  Fast and Healthy Breakfasts  Powerhouse Plant-Based Proteins  Satisfying Salads  and Dressings  Simple Sides and Sauces  International Cuisine-Spotlight on the [employer] Zones  Delicious Desserts  Savory Soups  [employer] - Meals in a Snap  Tasty Appetizers and Snacks  Comforting Weekend Breakfasts  One-Pot Wonders   Fast Evening Meals  Landscape architect Your Pritikin Plate  WORKSHOPS   Healthy Mindset (Psychosocial):  Focused Goals, Sustainable Changes Clinical staff led group instruction and group discussion with PowerPoint presentation and patient guidebook. To enhance the learning environment the use of posters, models and videos may be added. Patients will be able to apply effective goal setting strategies to establish at least one personal goal, and then take consistent, meaningful action toward that goal. They will learn to identify common barriers to achieving personal goals and develop strategies to overcome them. Patients will also gain an understanding of how our mind-set can impact our ability to achieve goals and the importance of cultivating a positive and growth-oriented mind-set. The purpose of this lesson is to provide patients with a deeper understanding of  how to set and achieve personal goals, as well as the tools and strategies needed to overcome common obstacles which may arise along the way.  From Head to Heart: The Power of a Healthy Outlook  Clinical staff led group instruction and group discussion with PowerPoint presentation and patient guidebook. To enhance the learning environment the use of posters, models and videos may be added. Patients will be able to recognize and describe the impact of emotions and mood on physical health. They will discover the importance of self-care and explore self-care practices which may work for them. Patients will also learn how to utilize the 4 C's to cultivate a healthier outlook and better manage stress and challenges. The purpose of this lesson is to demonstrate to patients how a healthy outlook is an essential part of maintaining good health, especially as they continue their cardiac rehab journey.  Healthy Sleep for a Healthy Heart Clinical staff led group instruction and group discussion with PowerPoint presentation and patient guidebook. To enhance the learning environment the use of posters, models and videos may be added. At the conclusion of this workshop, patients will be able to demonstrate knowledge of the importance of sleep to overall health, well-being, and quality of life. They will understand the symptoms of, and treatments for, common sleep disorders. Patients will also be able to identify daytime and nighttime behaviors which impact sleep, and they will be able to apply these tools to help manage sleep-related challenges. The purpose of this lesson is to provide patients with a general overview of sleep and outline the importance of quality sleep. Patients will learn about a few of the most common sleep disorders. Patients will also be introduced to the concept of "sleep hygiene," and discover ways to self-manage certain sleeping problems through simple daily behavior changes. Finally, the workshop  will motivate patients by clarifying the links between quality sleep and their goals of heart-healthy living.   Recognizing and Reducing Stress Clinical staff led group instruction and group discussion with PowerPoint presentation and patient guidebook. To enhance the learning environment the use of posters, models and videos may be added. At the conclusion of this workshop, patients will be able to understand the types of stress reactions, differentiate between acute and chronic stress, and recognize the impact that chronic stress has on their health. They will also be able to apply different coping mechanisms, such as reframing negative self-talk. Patients will have the opportunity to practice  a variety of stress management techniques, such as deep abdominal breathing, progressive muscle relaxation, and/or guided imagery.  The purpose of this lesson is to educate patients on the role of stress in their lives and to provide healthy techniques for coping with it.  Learning Barriers/Preferences:  Learning Barriers/Preferences - 09/30/23 1010       Learning Barriers/Preferences   Learning Barriers Sight   wears glasses   Learning Preferences Audio;Computer/Internet;Group Instruction;Individual Instruction;Pictoral;Skilled Demonstration;Verbal Instruction;Video;Written Material             Education Topics:  Knowledge Questionnaire Score:  Knowledge Questionnaire Score - 09/30/23 1011       Knowledge Questionnaire Score   Pre Score 21/24             Core Components/Risk Factors/Patient Goals at Admission:  Personal Goals and Risk Factors at Admission - 09/30/23 1011       Core Components/Risk Factors/Patient Goals on Admission    Weight Management Yes;Obesity;Weight Loss    Intervention Weight Management: Develop a combined nutrition and exercise program designed to reach desired caloric intake, while maintaining appropriate intake of nutrient and fiber, sodium and fats, and  appropriate energy expenditure required for the weight goal.;Weight Management: Provide education and appropriate resources to help participant work on and attain dietary goals.;Weight Management/Obesity: Establish reasonable short term and long term weight goals.;Obesity: Provide education and appropriate resources to help participant work on and attain dietary goals.    Expected Outcomes Short Term: Continue to assess and modify interventions until short term weight is achieved;Long Term: Adherence to nutrition and physical activity/exercise program aimed toward attainment of established weight goal;Weight Loss: Understanding of general recommendations for a balanced deficit meal plan, which promotes 1-2 lb weight loss per week and includes a negative energy balance of [phone] kcal/d;Understanding recommendations for meals to include 15-35% energy as protein, 25-35% energy from fat, 35-60% energy from carbohydrates, less than 200mg  of dietary cholesterol, 20-35 gm of total fiber daily;Understanding of distribution of calorie intake throughout the day with the consumption of 4-5 meals/snacks    Diabetes Yes    Intervention Provide education about signs/symptoms and action to take for hypo/hyperglycemia.;Provide education about proper nutrition, including hydration, and aerobic/resistive exercise prescription along with prescribed medications to achieve blood glucose in normal ranges: Fasting glucose 65-99 mg/dL    Expected Outcomes Short Term: Participant verbalizes understanding of the signs/symptoms and immediate care of hyper/hypoglycemia, proper foot care and importance of medication, aerobic/resistive exercise and nutrition plan for blood glucose control.;Long Term: Attainment of HbA1C < 7%.    Hypertension Yes    Intervention Provide education on lifestyle modifcations including regular physical activity/exercise, weight management, moderate sodium restriction and increased consumption of fresh fruit,  vegetables, and low fat dairy, alcohol moderation, and smoking cessation.;Monitor prescription use compliance.    Expected Outcomes Short Term: Continued assessment and intervention until BP is < 140/7mm HG in hypertensive participants. < 130/68mm HG in hypertensive participants with diabetes, heart failure or chronic kidney disease.;Long Term: Maintenance of blood pressure at goal levels.    Lipids Yes    Intervention Provide education and support for participant on nutrition & aerobic/resistive exercise along with prescribed medications to achieve LDL 70mg , HDL >40mg .    Expected Outcomes Short Term: Participant states understanding of desired cholesterol values and is compliant with medications prescribed. Participant is following exercise prescription and nutrition guidelines.;Long Term: Cholesterol controlled with medications as prescribed, with individualized exercise RX and with personalized nutrition plan. Value goals: LDL < 70mg , HDL >  40 mg.             Core Components/Risk Factors/Patient Goals Review:   Goals and Risk Factor Review     Row Name 10/12/23 0809 10/28/23 0848 11/22/23 1632 12/17/23 1102       Core Components/Risk Factors/Patient Goals Review   Personal Goals Review Weight Management/Obesity;Hypertension;Lipids;Diabetes Weight Management/Obesity;Hypertension;Lipids;Diabetes Weight Management/Obesity;Hypertension;Lipids;Diabetes Weight Management/Obesity;Hypertension;Lipids;Diabetes    Review Yovanni started cardiac rehab on 10/08/23. Zach did well with exercise. Vital signs and CBG's were stable [place] has been doing  well with exercise. Vital signs and CBG's remain stable. Detric has lost 2.7 kg since starting cardiac rehab, Rolando has been doing  well with exercise. Vital signs and CBG's remain stable. Filbert has lost 2.1 kg since starting cardiac rehab, Hardin has been doing very   well with exercise. Vital signs and CBG's remain stable. Truitt has lost 2.2 kg since starting cardiac  rehab, Jarman will tenatively complete cardiac rehab on 01/07/24.    Expected Outcomes Brek will continue to particpate in cardiac rehab for exercise, nutrition and lifestyle modifications. Lytle will continue to particpate in cardiac rehab for exercise, nutrition and lifestyle modifications. Tre will continue to particpate in cardiac rehab for exercise, nutrition and lifestyle modifications. Amador will continue to particpate in cardiac rehab for exercise, nutrition and lifestyle modifications.             Core Components/Risk Factors/Patient Goals at Discharge (Final Review):   Goals and Risk Factor Review - 12/17/23 1102       Core Components/Risk Factors/Patient Goals Review   Personal Goals Review Weight Management/Obesity;Hypertension;Lipids;Diabetes    Review Eustacio has been doing very   well with exercise. Vital signs and CBG's remain stable. Numan has lost 2.2 kg since starting cardiac rehab, Taiwo will tenatively complete cardiac rehab on 01/07/24.    Expected Outcomes Satvik will continue to particpate in cardiac rehab for exercise, nutrition and lifestyle modifications.             ITP Comments:  ITP Comments     Row Name 09/30/23 0940 10/06/23 0858 10/12/23 0804 10/28/23 2130 11/22/23 1624   ITP Comments Armanda Magic, MD: Medical Director.  Introduction to the Pritikin Education Program/Intensive Cardiac Rehab.  Initial orientation packet reviewed with the patient. 30 Day ITP Review. Amier will tenatively start exercise on 10/08/23. 30 Day ITP Review. Vrishank  started cardiac rehab on 10/08/23. Ilyas did well with exercise. 30 Day ITP Review. Elbie  has good participation when in attendance at  cardiac rehab 30 Day ITP Review. Billyjoe  has good attendance and participation in cardiac rehab    Row Name 12/17/23 1059           ITP Comments 30 Day ITP Review. Grabiel  continues to have  good attendance and participation in cardiac rehab. Dolphus will complete caridac rehab tenatively on 01/07/24.                 Comments: See ITP comments.Thayer Headings RN BSN

## 2023-12-20 ENCOUNTER — Encounter (HOSPITAL_COMMUNITY)
Admission: RE | Admit: 2023-12-20 | Discharge: 2023-12-20 | Disposition: A | Discharge status: Home / Self Care | Payer: PPO | Source: Ambulatory Visit | Attending: Internal Medicine | Admitting: Internal Medicine

## 2023-12-20 DIAGNOSIS — Z955 Presence of coronary angioplasty implant and graft: Secondary | ICD-10-CM | POA: Diagnosis present

## 2023-12-22 ENCOUNTER — Encounter (HOSPITAL_COMMUNITY): Payer: PPO

## 2023-12-24 ENCOUNTER — Encounter (HOSPITAL_COMMUNITY)
Admission: RE | Admit: 2023-12-24 | Discharge: 2023-12-24 | Disposition: A | Discharge status: Home / Self Care | Payer: PPO | Source: Ambulatory Visit | Attending: Internal Medicine | Admitting: Internal Medicine

## 2023-12-24 DIAGNOSIS — Z955 Presence of coronary angioplasty implant and graft: Secondary | ICD-10-CM | POA: Diagnosis not present

## 2023-12-27 ENCOUNTER — Encounter (HOSPITAL_COMMUNITY)
Admission: RE | Admit: 2023-12-27 | Discharge: 2023-12-27 | Disposition: A | Discharge status: Home / Self Care | Payer: PPO | Source: Ambulatory Visit | Attending: Internal Medicine | Admitting: Internal Medicine

## 2023-12-27 DIAGNOSIS — Z955 Presence of coronary angioplasty implant and graft: Secondary | ICD-10-CM | POA: Diagnosis not present

## 2023-12-29 ENCOUNTER — Encounter (HOSPITAL_COMMUNITY)
Admission: RE | Admit: 2023-12-29 | Discharge: 2023-12-29 | Discharge status: Home / Self Care | Payer: PPO | Source: Ambulatory Visit | Attending: Internal Medicine

## 2023-12-29 DIAGNOSIS — Z955 Presence of coronary angioplasty implant and graft: Secondary | ICD-10-CM | POA: Diagnosis not present

## 2023-12-31 ENCOUNTER — Encounter (HOSPITAL_COMMUNITY)
Admission: RE | Admit: 2023-12-31 | Discharge: 2023-12-31 | Disposition: A | Discharge status: Home / Self Care | Payer: PPO | Source: Ambulatory Visit | Attending: Internal Medicine | Admitting: Internal Medicine

## 2023-12-31 DIAGNOSIS — Z955 Presence of coronary angioplasty implant and graft: Secondary | ICD-10-CM

## 2024-01-03 ENCOUNTER — Encounter (HOSPITAL_COMMUNITY)
Admission: RE | Admit: 2024-01-03 | Discharge: 2024-01-03 | Disposition: A | Discharge status: Home / Self Care | Payer: PPO | Source: Ambulatory Visit | Attending: Internal Medicine

## 2024-01-03 DIAGNOSIS — Z955 Presence of coronary angioplasty implant and graft: Secondary | ICD-10-CM

## 2024-01-05 ENCOUNTER — Encounter (HOSPITAL_COMMUNITY): Payer: PPO

## 2024-01-07 ENCOUNTER — Encounter (HOSPITAL_COMMUNITY)
Admission: RE | Admit: 2024-01-07 | Discharge: 2024-01-07 | Disposition: A | Discharge status: Home / Self Care | Payer: PPO | Source: Ambulatory Visit | Attending: Internal Medicine | Admitting: Internal Medicine

## 2024-01-07 VITALS — Ht 66.0 in | Wt 191.8 lb

## 2024-01-07 DIAGNOSIS — Z955 Presence of coronary angioplasty implant and graft: Secondary | ICD-10-CM | POA: Diagnosis not present

## 2024-01-07 NOTE — Progress Notes (Incomplete)
Daily Session Note  Patient Details  Name: Patrick Cooley MRN: [id number] Date of Birth: 1956/03/13 Referring Provider:   Flowsheet Row INTENSIVE CARDIAC REHAB ORIENT from 09/30/2023 in [facility]  Referring Provider Patrick Brass, MD       Encounter Date: 01/07/2024  Check In:  Session Check In - 01/07/24 1246       Check-In   Supervising physician immediately available to respond to emergencies [facility] MD immediately available    Physician(s) Joni Reining, NP    Location [facility]    Staff Present Lorin Picket, MS, ACSM-CEP, CCRP, Exercise Physiologist;Jenesis Martin, RN, BSN;Jetta Walker BS, ACSM-CEP, Exercise Physiologist;Olinty Peggye Pitt, MS, ACSM-CEP, Exercise Physiologist;Bailey Wallace Cullens, MS, Exercise Physiologist;Johnny Hale Bogus, MS, Exercise Physiologist    Virtual Visit No    Medication changes reported     No    Fall or balance concerns reported    No    Tobacco Cessation No Change    Warm-up and Cool-down Performed as group-led instruction    Resistance Training Performed Yes    VAD Patient? No    PAD/SET Patient? No      Pain Assessment   Currently in Pain? No/denies    Pain Score 0-No pain    Multiple Pain Sites No             Capillary Blood Glucose: No results found for this or any previous visit (from the past 24 hours).    Social History   Tobacco Use  Smoking Status Never  Smokeless Tobacco Never    Goals Met:  Exercise tolerated well No report of concerns or symptoms today Strength training completed today  Goals Unmet:  Not Applicable  Comments: Pt graduates from  Intensive/Traditional cardiac rehab program today with completion of    ** exercise and education sessions. Pt maintained good attendance and progressed nicely during their participation in rehab as evidenced by increased MET level.   Medication list reconciled. Repeat  PHQ score- 0 .  Pt has  made significant lifestyle changes and should be commended for their success. Ran  achieved their goals during cardiac rehab.   Pt plans to continue exercise at  the [facility] and riding his mountain bike. We are proud of Patrick Cooley progress!Patrick Headings RN BSN    Dr. Armanda Magic is Medical Director for Cardiac Rehab at [facility].

## 2024-01-10 ENCOUNTER — Encounter (HOSPITAL_COMMUNITY): Payer: PPO

## 2024-01-13 NOTE — Progress Notes (Signed)
 Discharge Progress Report  Patient Details  Name: Patrick Cooley MRN: [id number] Date of Birth: 04-01-1956 Referring Provider:   Flowsheet Row INTENSIVE CARDIAC REHAB ORIENT from 09/30/2023 in [facility]  Referring Provider Weston Brass, MD        Number of Visits: 85  Reason for Discharge:  Patient reached a stable level of exercise. Patient independent in their exercise. Patient has met program and personal goals.  Smoking History:  Social History   Tobacco Use  Smoking Status Never  Smokeless Tobacco Never    Diagnosis:  09/08/23 Status post coronary artery stent placement  ADL UCSD:   Initial Exercise Prescription:  Initial Exercise Prescription - 09/30/23 1100       Date of Initial Exercise RX and Referring Provider   Date 09/30/23    Referring Provider Weston Brass, MD    Expected Discharge Date 12/22/23      Bike   Level 1    Watts 50    Minutes 15    METs 3      Recumbant Elliptical   Level 1    RPM 75    Watts 90    Minutes 15    METs 3      Prescription Details   Frequency (times per week) 3    Duration Progress to 30 minutes of continuous aerobic without signs/symptoms of physical distress      Intensity   THRR 40-80% of Max Heartrate 62-124    Ratings of Perceived Exertion 11-13    Perceived Dyspnea 0-4      Progression   Progression Continue progressive overload as per policy without signs/symptoms or physical distress.      Resistance Training   Training Prescription Yes    Weight 3    Reps 10-15             Discharge Exercise Prescription (Final Exercise Prescription Changes):  Exercise Prescription Changes - 01/07/24 1600       Response to Exercise   Blood Pressure (Admit) 100/60    Blood Pressure (Exercise) 124/60    Blood Pressure (Exit) 96/61    Heart Rate (Admit) 80 bpm    Heart Rate (Exercise) 145 bpm    Heart Rate (Exit) 94 bpm    Rating  of Perceived Exertion (Exercise) 12    Symptoms None    Comments Pt graduated from the CRP2 program today    Duration Continue with 30 min of aerobic exercise without signs/symptoms of physical distress.    Intensity THRR unchanged      Progression   Progression Continue to progress workloads to maintain intensity without signs/symptoms of physical distress.    Average METs 5.25      Resistance Training   Training Prescription Yes    Weight 5 lbs    Reps 10-15    Time 10 Minutes      Interval Training   Interval Training No      Recumbant Elliptical   Level 8    RPM 68    Watts 145    Minutes 15    METs 6.6      Track   Laps --   1910 feet   Minutes 6    METs 3.9      Home Exercise Plan   Plans to continue exercise at [employer] (comment)    Frequency Add 2 additional days to program exercise sessions.    Initial Home  Exercises Provided 11/12/23             Functional Capacity:  6 Minute Walk     Row Name 09/30/23 1106 01/07/24 1255       6 Minute Walk   Phase Initial Discharge    Distance 2040 feet 1910 feet    Distance % Change -- -6.37 %    Distance Feet Change -- -130 ft    Walk Time 6 minutes 6 minutes    # of Rest Breaks 0 0    MPH 3.86 3.62    METS 4.38 4.12    RPE 9 12    Perceived Dyspnea  0 0    VO2 Peak 15.31 14.4    Symptoms No No    Resting HR 80 bpm 79 bpm    Resting BP 96/58  recheck 112/60 100/60    Resting Oxygen Saturation  94 % --    Exercise Oxygen Saturation  during 6 min walk 97 % --    Max Ex. HR 121 bpm 115 bpm    Max Ex. BP 130/66 124/60    2 Minute Post BP 108/64 --             Psychological, QOL, Others - Outcomes: PHQ 2/9:    01/07/2024    1:59 PM 09/30/2023   10:14 AM  Depression screen PHQ 2/9  Decreased Interest 0 0  Down, Depressed, Hopeless 0 0  PHQ - 2 Score 0 0  Altered sleeping 0 1  Tired, decreased energy 0 1  Change in appetite 0 0  Feeling bad or failure about yourself  0 0  Trouble  concentrating 0 0  Moving slowly or fidgety/restless 0 0  Suicidal thoughts 0 0  PHQ-9 Score 0 2  Difficult doing work/chores Not difficult at all Not difficult at all    Quality of Life:  Quality of Life - 12/28/23 1206       Quality of Life Scores   Health/Function Pre 28.23 %    Health/Function Post 28.8 %    Health/Function % Change 2.02 %    Socioeconomic Pre 28.57 %    Socioeconomic Post 30 %    Socioeconomic % Change  5.01 %    Psych/Spiritual Pre 30 %    Psych/Spiritual Post 30 %    Psych/Spiritual % Change 0 %    Family Pre 30 %    Family Post 30 %    Family % Change 0 %    GLOBAL Pre 28.93 %    GLOBAL Post 29.49 %    GLOBAL % Change 1.94 %             Personal Goals: Goals established at orientation with interventions provided to work toward goal.  Personal Goals and Risk Factors at Admission - 09/30/23 1011       Core Components/Risk Factors/Patient Goals on Admission    Weight Management Yes;Obesity;Weight Loss    Intervention Weight Management: Develop a combined nutrition and exercise program designed to reach desired caloric intake, while maintaining appropriate intake of nutrient and fiber, sodium and fats, and appropriate energy expenditure required for the weight goal.;Weight Management: Provide education and appropriate resources to help participant work on and attain dietary goals.;Weight Management/Obesity: Establish reasonable short term and long term weight goals.;Obesity: Provide education and appropriate resources to help participant work on and attain dietary goals.    Expected Outcomes Short Term: Continue to assess and modify interventions until short term weight is  achieved;Long Term: Adherence to nutrition and physical activity/exercise program aimed toward attainment of established weight goal;Weight Loss: Understanding of general recommendations for a balanced deficit meal plan, which promotes 1-2 lb weight loss per week and includes a  negative energy balance of [phone] kcal/d;Understanding recommendations for meals to include 15-35% energy as protein, 25-35% energy from fat, 35-60% energy from carbohydrates, less than 200mg  of dietary cholesterol, 20-35 gm of total fiber daily;Understanding of distribution of calorie intake throughout the day with the consumption of 4-5 meals/snacks    Diabetes Yes    Intervention Provide education about signs/symptoms and action to take for hypo/hyperglycemia.;Provide education about proper nutrition, including hydration, and aerobic/resistive exercise prescription along with prescribed medications to achieve blood glucose in normal ranges: Fasting glucose 65-99 mg/dL    Expected Outcomes Short Term: Participant verbalizes understanding of the signs/symptoms and immediate care of hyper/hypoglycemia, proper foot care and importance of medication, aerobic/resistive exercise and nutrition plan for blood glucose control.;Long Term: Attainment of HbA1C < 7%.    Hypertension Yes    Intervention Provide education on lifestyle modifcations including regular physical activity/exercise, weight management, moderate sodium restriction and increased consumption of fresh fruit, vegetables, and low fat dairy, alcohol moderation, and smoking cessation.;Monitor prescription use compliance.    Expected Outcomes Short Term: Continued assessment and intervention until BP is < 140/47mm HG in hypertensive participants. < 130/16mm HG in hypertensive participants with diabetes, heart failure or chronic kidney disease.;Long Term: Maintenance of blood pressure at goal levels.    Lipids Yes    Intervention Provide education and support for participant on nutrition & aerobic/resistive exercise along with prescribed medications to achieve LDL 70mg , HDL >40mg .    Expected Outcomes Short Term: Participant states understanding of desired cholesterol values and is compliant with medications prescribed. Participant is following  exercise prescription and nutrition guidelines.;Long Term: Cholesterol controlled with medications as prescribed, with individualized exercise RX and with personalized nutrition plan. Value goals: LDL < 70mg , HDL > 40 mg.              Personal Goals Discharge:  Goals and Risk Factor Review     Row Name 10/12/23 0809 10/28/23 0848 11/22/23 1632 12/17/23 1102       Core Components/Risk Factors/Patient Goals Review   Personal Goals Review Weight Management/Obesity;Hypertension;Lipids;Diabetes Weight Management/Obesity;Hypertension;Lipids;Diabetes Weight Management/Obesity;Hypertension;Lipids;Diabetes Weight Management/Obesity;Hypertension;Lipids;Diabetes    Review Beckham started cardiac rehab on 10/08/23. Joandy did well with exercise. Vital signs and CBG's were stable Erasmo has been doing  well with exercise. Vital signs and CBG's remain stable. Jahmier has lost 2.7 kg since starting cardiac rehab, Budd has been doing  well with exercise. Vital signs and CBG's remain stable. Adarsh has lost 2.1 kg since starting cardiac rehab, Diar has been doing very   well with exercise. Vital signs and CBG's remain stable. Mizael has lost 2.2 kg since starting cardiac rehab, Ben will tenatively complete cardiac rehab on 01/07/24.    Expected Outcomes Edan will continue to particpate in cardiac rehab for exercise, nutrition and lifestyle modifications. Mohmmad will continue to particpate in cardiac rehab for exercise, nutrition and lifestyle modifications. Carnell will continue to particpate in cardiac rehab for exercise, nutrition and lifestyle modifications. Marte will continue to particpate in cardiac rehab for exercise, nutrition and lifestyle modifications.             Exercise Goals and Review:  Exercise Goals     Row Name 09/30/23 [phone]  Exercise Goals   Increase Physical Activity Yes       Intervention Provide advice, education, support and counseling about physical activity/exercise  needs.;Develop an individualized exercise prescription for aerobic and resistive training based on initial evaluation findings, risk stratification, comorbidities and participant's personal goals.       Expected Outcomes Short Term: Attend rehab on a regular basis to increase amount of physical activity.;Long Term: Exercising regularly at least 3-5 days a week.;Long Term: Add in home exercise to make exercise part of routine and to increase amount of physical activity.       Increase Strength and Stamina Yes       Intervention Provide advice, education, support and counseling about physical activity/exercise needs.;Develop an individualized exercise prescription for aerobic and resistive training based on initial evaluation findings, risk stratification, comorbidities and participant's personal goals.       Expected Outcomes Short Term: Increase workloads from initial exercise prescription for resistance, speed, and METs.;Short Term: Perform resistance training exercises routinely during rehab and add in resistance training at home;Long Term: Improve cardiorespiratory fitness, muscular endurance and strength as measured by increased METs and functional capacity ( )       Able to understand and use rate of perceived exertion (RPE) scale Yes       Intervention Provide education and explanation on how to use RPE scale       Expected Outcomes Short Term: Able to use RPE daily in rehab to express subjective intensity level;Long Term:  Able to use RPE to guide intensity level when exercising independently       Knowledge and understanding of Target Heart Rate Range (THRR) Yes       Intervention Provide education and explanation of THRR including how the numbers were predicted and where they are located for reference       Expected Outcomes Short Term: Able to state/look up THRR;Long Term: Able to use THRR to govern intensity when exercising independently;Short Term: Able to use daily as guideline for  intensity in rehab       Understanding of Exercise Prescription Yes       Intervention Provide education, explanation, and written materials on patient's individual exercise prescription       Expected Outcomes Short Term: Able to explain program exercise prescription;Long Term: Able to explain home exercise prescription to exercise independently                Exercise Goals Re-Evaluation:  Exercise Goals Re-Evaluation     Row Name 10/08/23 1427 11/08/23 1549 12/10/23 1500 01/07/24 1702       Exercise Goal Re-Evaluation   Exercise Goals Review Increase Physical Activity;Understanding of Exercise Prescription;Increase Strength and Stamina;Knowledge and understanding of Target Heart Rate Range (THRR);Able to understand and use rate of perceived exertion (RPE) scale Increase Physical Activity;Understanding of Exercise Prescription;Increase Strength and Stamina;Knowledge and understanding of Target Heart Rate Range (THRR);Able to understand and use rate of perceived exertion (RPE) scale Increase Physical Activity;Understanding of Exercise Prescription;Increase Strength and Stamina;Knowledge and understanding of Target Heart Rate Range (THRR);Able to understand and use rate of perceived exertion (RPE) scale Increase Physical Activity;Understanding of Exercise Prescription;Increase Strength and Stamina;Knowledge and understanding of Target Heart Rate Range (THRR);Able to understand and use rate of perceived exertion (RPE) scale    Comments Pt's first day in the CRP2 program. Pt understnads the  exercise RX, THRR, and RPE scale. Reviewed METs and goals. Pt voices some progress on his goal of increased strength and stamina. His goal  to build overall fitness is a work in progress. Reviewed METs and goals. Pt voices progress on his goal of increased strength and stamina as he has been able to increase workloads on his modalitites. His goal to build overall fitness is a work in progress. Pt graduated from  the CRP2 program today. Pt made good progress on his goal of improved his strength and stamina. Pt and his wife will be exercising together at the gym and the pt will use the [employer] and bike. Pt had peak METs of 6.8 during the program. .    Expected Outcomes Will continute to montior patient and progress exercise workloads as tolerated. Will continute to montior patient and progress exercise workloads as tolerated. Will continute to montior patient and progress exercise workloads as tolerated. Pt will continue to exercise on his own at the gym.             Nutrition & Weight - Outcomes:  Pre Biometrics - 09/30/23 0939       Pre Biometrics   Waist Circumference 45 inches    Hip Circumference 435.5 inches    Waist to Hip Ratio 0.1 %    Triceps Skinfold 9 mm    % Body Fat 29.7 %    Grip Strength 38 kg    Flexibility 11 in    Single Leg Stand 30 seconds             Post Biometrics - 01/07/24 1310        Post  Biometrics   Height 5\' 6"  (1.676 m)    Weight 87 kg    Waist Circumference 41 inches    Hip Circumference 41.5 inches    Waist to Hip Ratio 0.99 %    BMI (Calculated) 30.97    Triceps Skinfold 9 mm    % Body Fat 27.3 %    Grip Strength 40 kg    Flexibility 12.25 in    Single Leg Stand 30 seconds             Nutrition:  Nutrition Therapy & Goals - 10/08/23 1407       Nutrition Therapy   Diet Heart Healthy Diet    Drug/Food Interactions Statins/Certain Fruits      Personal Nutrition Goals   Nutrition Goal Patient to identify strategies for reducing cardiovascular risk by attending the [employer] education and nutrition series weekly.    Personal Goal #2 Patient to improve diet quality by using the plate method as a guide for meal planning to include lean protein/plant protein, fruits, vegetables, whole grains, nonfat dairy as part of a well-balanced diet.    Personal Goal #3 Patient to limit sodium to 2300mg  per day    Comments Kahil has medical history of  CAD, Hyperlipidemia, DM2, OSA, HTN, s/p coronary artery stent. He is taking mounjaro to aid with weight loss, blood sugar control. LDL remains at goal <70 (65). His wife is motivated to make lifestyle changes as well. Patient will benefit from participation in intensive cardiac rehab for nutrition, exercise, and lifestyle modification.      Intervention Plan   Intervention Prescribe, educate and counsel regarding individualized specific dietary modifications aiming towards targeted core components such as weight, hypertension, lipid management, diabetes, heart failure and other comorbidities.;Nutrition handout(s) given to patient.    Expected Outcomes Short Term Goal: Understand basic principles of dietary content, such as calories, fat, sodium, cholesterol and nutrients.;Long Term Goal: Adherence to prescribed nutrition plan.  Nutrition Discharge:   Education Questionnaire Score:  Knowledge Questionnaire Score - 12/28/23 1206       Knowledge Questionnaire Score   Post Score 24/24             Goals reviewed with patient; copy given to patient.Pt graduates from  Intensive/Traditional cardiac rehab program on 01/07/24  with completion of  31 exercise and  27 education sessions. Pt maintained good attendance and progressed nicely during their participation in rehab as evidenced by increased MET level. Rube lost 3.5 kg while enrolled in the program.   Medication list reconciled. Repeat  PHQ score- 0 .  Pt has made significant lifestyle changes and should be commended for his success. Willey  achieved his goals during cardiac rehab.   Pt plans to continue exercise at  the [facility] and ride his mountain bike. We are proud of Garris's progress and weight loss !Thayer Headings RN BSN.

## 2024-02-01 ENCOUNTER — Ambulatory Visit: Payer: PPO | Attending: Internal Medicine | Admitting: Internal Medicine

## 2024-02-01 VITALS — BP 102/70 | HR 89 | Ht 66.0 in | Wt 193.0 lb

## 2024-02-01 DIAGNOSIS — Z79899 Other long term (current) drug therapy: Secondary | ICD-10-CM

## 2024-02-01 DIAGNOSIS — I340 Nonrheumatic mitral (valve) insufficiency: Secondary | ICD-10-CM

## 2024-02-01 DIAGNOSIS — E1169 Type 2 diabetes mellitus with other specified complication: Secondary | ICD-10-CM | POA: Diagnosis not present

## 2024-02-01 DIAGNOSIS — I2510 Atherosclerotic heart disease of native coronary artery without angina pectoris: Secondary | ICD-10-CM

## 2024-02-01 DIAGNOSIS — G4733 Obstructive sleep apnea (adult) (pediatric): Secondary | ICD-10-CM

## 2024-02-01 DIAGNOSIS — I10 Essential (primary) hypertension: Secondary | ICD-10-CM | POA: Diagnosis not present

## 2024-02-01 DIAGNOSIS — E7800 Pure hypercholesterolemia, unspecified: Secondary | ICD-10-CM | POA: Diagnosis not present

## 2024-02-01 DIAGNOSIS — I4510 Unspecified right bundle-branch block: Secondary | ICD-10-CM

## 2024-02-01 DIAGNOSIS — I2089 Other forms of angina pectoris: Secondary | ICD-10-CM

## 2024-02-01 NOTE — Progress Notes (Unsigned)
  Cardiology Office Note:  .   Date:  02/01/2024  ID:  Patrick Cooley, DOB 06-May-1956, MRN [id number] PCP: Sigmund Hazel, MD  [facility] Providers Cardiologist:  Parke Poisson, MD    History of Present Illness: .   Patrick Cooley is a 68 y.o. male ***  ROS: ***  Studies Reviewed: .        *** Risk Assessment/Calculations:   {Does this patient have ATRIAL FIBRILLATION?[phone]}         Physical Exam:   VS:  BP 102/70 (BP Location: Left Arm, Patient Position: Sitting, Cuff Size: Normal)   Pulse 89   Ht 5\' 6"  (1.676 m)   Wt 193 lb (87.5 kg)   SpO2 95%   BMI 31.15 kg/m    Wt Readings from Last 3 Encounters:  02/01/24 193 lb (87.5 kg)  01/07/24 191 lb 12.8 oz (87 kg)  09/30/23 199 lb 8.3 oz (90.5 kg)    GEN: Well nourished, well developed in no acute distress NECK: No JVD; No carotid bruits CARDIAC: ***RRR, no murmurs, rubs, gallops RESPIRATORY:  Clear to auscultation without rales, wheezing or rhonchi  ABDOMEN: Soft, non-tender, non-distended EXTREMITIES:  No edema; No deformity   ASSESSMENT AND PLAN: .   ***    {Are you ordering a CV Procedure (e.g. stress test, cath, DCCV, TEE, etc)?   Press F2        :[id number]}  Dispo: ***  Signed, Parke Poisson, MD

## 2024-02-01 NOTE — Patient Instructions (Signed)
 Medication Instructions:  Stop: Losartan (Cozaar) as of Today  Stop: Clopidogrel (Plavix) on 03/08/2024   Lab Work: None  Testing/Procedures: Your physician has requested that you have an echocardiogram in about six months, right before returning for your 6 month follow up visit with Dr. Jacques Navy. Echocardiography is a painless test that uses sound waves to create images of your heart. It provides your doctor with information about the size and shape of your heart and how well your heart's chambers and valves are working. This procedure takes approximately one hour. There are no restrictions for this procedure. Please do NOT wear cologne, perfume, aftershave, or lotions (deodorant is allowed). Please arrive 15 minutes prior to your appointment time. This will take place at [address] [place]    Follow-Up: At [facility], you and your health needs are our priority.  As part of our continuing mission to provide you with exceptional heart care, we have created designated Provider Care Teams.  These Care Teams include your primary Cardiologist (physician) and Advanced Practice Providers (APPs -  Physician Assistants and Nurse Practitioners) who all work together to provide you with the care you need, when you need it.  Your next appointment:   6 month(s)  Provider:   Parke Poisson, MD

## 2024-02-07 DIAGNOSIS — G4733 Obstructive sleep apnea (adult) (pediatric): Secondary | ICD-10-CM | POA: Diagnosis not present

## 2024-02-16 DIAGNOSIS — E1159 Type 2 diabetes mellitus with other circulatory complications: Secondary | ICD-10-CM | POA: Diagnosis not present

## 2024-02-23 ENCOUNTER — Other Ambulatory Visit: Payer: Self-pay | Admitting: Cardiology

## 2024-02-23 DIAGNOSIS — Z95820 Peripheral vascular angioplasty status with implants and grafts: Secondary | ICD-10-CM | POA: Diagnosis not present

## 2024-02-23 DIAGNOSIS — I2510 Atherosclerotic heart disease of native coronary artery without angina pectoris: Secondary | ICD-10-CM | POA: Diagnosis not present

## 2024-02-23 DIAGNOSIS — E1136 Type 2 diabetes mellitus with diabetic cataract: Secondary | ICD-10-CM | POA: Diagnosis not present

## 2024-02-23 DIAGNOSIS — Z1159 Encounter for screening for other viral diseases: Secondary | ICD-10-CM | POA: Diagnosis not present

## 2024-02-23 DIAGNOSIS — Z23 Encounter for immunization: Secondary | ICD-10-CM | POA: Diagnosis not present

## 2024-02-23 DIAGNOSIS — E669 Obesity, unspecified: Secondary | ICD-10-CM | POA: Diagnosis not present

## 2024-02-23 DIAGNOSIS — R202 Paresthesia of skin: Secondary | ICD-10-CM | POA: Diagnosis not present

## 2024-02-23 DIAGNOSIS — N400 Enlarged prostate without lower urinary tract symptoms: Secondary | ICD-10-CM | POA: Diagnosis not present

## 2024-02-23 DIAGNOSIS — N289 Disorder of kidney and ureter, unspecified: Secondary | ICD-10-CM | POA: Diagnosis not present

## 2024-02-23 DIAGNOSIS — Z6832 Body mass index (BMI) 32.0-32.9, adult: Secondary | ICD-10-CM | POA: Diagnosis not present

## 2024-02-23 DIAGNOSIS — K219 Gastro-esophageal reflux disease without esophagitis: Secondary | ICD-10-CM | POA: Diagnosis not present

## 2024-03-03 ENCOUNTER — Encounter: Payer: Self-pay | Admitting: Internal Medicine

## 2024-03-03 NOTE — Telephone Encounter (Signed)
 Spoke with pt, for about 1 month now he has noticed a pressure in his right chest. He states it feels like someone is poking him in the chest. He has been working out and had increased his weights and then started noticing the pressure. He back off on the weights and the pressure has not gone away. There is noting that makes the pressure worse or better and he does not notice it when working out or with exertion. He is not having any SOB, but states this is similar to the discomfort he had prior to the stent. On a pain scale from 1-10, he reports it is 1 or 2. He did not mention it at his follow up appointment because he thought it would go away. Aware dr loni is not in the office today but will forward for her review.

## 2024-03-03 NOTE — Telephone Encounter (Signed)
 Spoke with pt, he reports, the times his blood pressure has been taken it is normal, around 120/80. He has not tried NTG. Will make dr Chancy Comber aware.

## 2024-04-05 DIAGNOSIS — M79642 Pain in left hand: Secondary | ICD-10-CM | POA: Diagnosis not present

## 2024-04-05 DIAGNOSIS — G5603 Carpal tunnel syndrome, bilateral upper limbs: Secondary | ICD-10-CM | POA: Diagnosis not present

## 2024-04-05 DIAGNOSIS — G5602 Carpal tunnel syndrome, left upper limb: Secondary | ICD-10-CM | POA: Diagnosis not present

## 2024-04-05 DIAGNOSIS — M79641 Pain in right hand: Secondary | ICD-10-CM | POA: Diagnosis not present

## 2024-04-05 DIAGNOSIS — G5601 Carpal tunnel syndrome, right upper limb: Secondary | ICD-10-CM | POA: Diagnosis not present

## 2024-04-15 DIAGNOSIS — E1136 Type 2 diabetes mellitus with diabetic cataract: Secondary | ICD-10-CM | POA: Diagnosis not present

## 2024-04-15 DIAGNOSIS — E1169 Type 2 diabetes mellitus with other specified complication: Secondary | ICD-10-CM | POA: Diagnosis not present

## 2024-04-15 DIAGNOSIS — N400 Enlarged prostate without lower urinary tract symptoms: Secondary | ICD-10-CM | POA: Diagnosis not present

## 2024-04-15 DIAGNOSIS — I10 Essential (primary) hypertension: Secondary | ICD-10-CM | POA: Diagnosis not present

## 2024-04-19 DIAGNOSIS — H16143 Punctate keratitis, bilateral: Secondary | ICD-10-CM | POA: Diagnosis not present

## 2024-04-25 DIAGNOSIS — M47816 Spondylosis without myelopathy or radiculopathy, lumbar region: Secondary | ICD-10-CM | POA: Diagnosis not present

## 2024-05-15 DIAGNOSIS — E1169 Type 2 diabetes mellitus with other specified complication: Secondary | ICD-10-CM | POA: Diagnosis not present

## 2024-05-15 DIAGNOSIS — I10 Essential (primary) hypertension: Secondary | ICD-10-CM | POA: Diagnosis not present

## 2024-05-15 DIAGNOSIS — N400 Enlarged prostate without lower urinary tract symptoms: Secondary | ICD-10-CM | POA: Diagnosis not present

## 2024-05-15 DIAGNOSIS — E1136 Type 2 diabetes mellitus with diabetic cataract: Secondary | ICD-10-CM | POA: Diagnosis not present

## 2024-05-18 DIAGNOSIS — M47816 Spondylosis without myelopathy or radiculopathy, lumbar region: Secondary | ICD-10-CM | POA: Diagnosis not present

## 2024-05-25 DIAGNOSIS — G5603 Carpal tunnel syndrome, bilateral upper limbs: Secondary | ICD-10-CM | POA: Diagnosis not present

## 2024-05-29 DIAGNOSIS — G5603 Carpal tunnel syndrome, bilateral upper limbs: Secondary | ICD-10-CM | POA: Diagnosis not present

## 2024-06-12 NOTE — Telephone Encounter (Signed)
 LVMTCB on 06/12/24; unsure if pt is aware of APP appt on 06/28/24 as he has not read the MyChart msg.

## 2024-06-15 DIAGNOSIS — N400 Enlarged prostate without lower urinary tract symptoms: Secondary | ICD-10-CM | POA: Diagnosis not present

## 2024-06-15 DIAGNOSIS — E1169 Type 2 diabetes mellitus with other specified complication: Secondary | ICD-10-CM | POA: Diagnosis not present

## 2024-06-15 DIAGNOSIS — I10 Essential (primary) hypertension: Secondary | ICD-10-CM | POA: Diagnosis not present

## 2024-06-15 DIAGNOSIS — E1136 Type 2 diabetes mellitus with diabetic cataract: Secondary | ICD-10-CM | POA: Diagnosis not present

## 2024-06-27 DIAGNOSIS — M47817 Spondylosis without myelopathy or radiculopathy, lumbosacral region: Secondary | ICD-10-CM | POA: Diagnosis not present

## 2024-06-27 DIAGNOSIS — M47816 Spondylosis without myelopathy or radiculopathy, lumbar region: Secondary | ICD-10-CM | POA: Diagnosis not present

## 2024-06-28 ENCOUNTER — Ambulatory Visit: Attending: Cardiovascular Disease | Admitting: Physician Assistant

## 2024-06-28 ENCOUNTER — Encounter: Payer: Self-pay | Admitting: Physician Assistant

## 2024-06-28 VITALS — BP 124/80 | HR 77 | Resp 16 | Ht 66.0 in | Wt 190.4 lb

## 2024-06-28 DIAGNOSIS — R072 Precordial pain: Secondary | ICD-10-CM | POA: Diagnosis not present

## 2024-06-28 DIAGNOSIS — E785 Hyperlipidemia, unspecified: Secondary | ICD-10-CM

## 2024-06-28 DIAGNOSIS — I25119 Atherosclerotic heart disease of native coronary artery with unspecified angina pectoris: Secondary | ICD-10-CM | POA: Diagnosis not present

## 2024-06-28 NOTE — Progress Notes (Signed)
 Cardiology Office Note   Date:  06/28/2024  ID:  Antonie, Borjon July 10, 1956, MRN [id number] PCP: Cleotilde Planas, MD  [facility] Providers Cardiologist:  Soyla DELENA Merck, MD     History of Present Illness Corney Knighton is a 68 y.o. male with past medical history of mitral valve regurgitation, RBBB, CAD, hypertension, hyperlipidemia, DM2 and OSA.  Patient was previously seen in September 2024 for exertional chest tightness and shortness of breath, a coronary CT was ordered and was done on 08/10/2023, this showed 25 to 49% disease in proximal LAD, 50 to 69% disease in mid LAD, 1 to 24% disease in mid left circumflex artery, 1 to 24% disease in mid to distal RCA.  Subsequent FFR analysis obtained on 08/10/2023 showed hemodynamically significant lesion in mid LAD distal to the origin of a large diagonal vessel. Patient ultimately underwent cardiac catheterization on 09/08/2023, this showed 99% LAD lesion treated with 2.25 x 30 mm Onyx DES.  The procedure was prolonged due to difficulty engaging anomalous left circumflex artery.  Postprocedure, he was placed on aspirin  and Plavix .  Atorvastatin  was increased to 40 mg daily.  Protonix  was added.   I last saw the patient on November 2024 at which time he was doing well.  He was last seen by Dr. Merck on 02/01/2024, due to borderline blood pressure, losartan  25 mg was stopped.  He was also instructed to stop Plavix  after 6 months around April 23 of this year.  He will be on 81 mg aspirin  indefinitely.  The plan is to repeat echocardiogram around September to reassess mitral regurgitation.  Patient presents today for evaluation of chest discomfort, he is sent the first message in the middle of April regarding right-sided chest pain.  Symptoms have subsided since that time.  Chest discomfort did not correlate with any physical exertion.  He still volunteers as a IT sales professional.  He can do strenuous activity without any reproducible chest pain.   Previous chest pain was on the left side of the chest, therefore location of the chest pain is also different from the previous angina.  In the past several weeks, he has not had any significant chest discomfort.  Since this symptom has subsided, EKG is unchanged, and a the symptom is overall atypical, I did not recommend any further workup.  I plan to reassess the patient in 4 to 6 weeks after his echocardiogram is done to make sure his symptom does not recur.  If symptoms stable at that time, likely will resume 67-month follow-up  ROS:   He denies chest pain, palpitations, dyspnea, pnd, orthopnea, n, v, dizziness, syncope, edema, weight gain, or early satiety. All other systems reviewed and are otherwise negative except as noted above.    Studies Reviewed      Cardiac Studies & Procedures   ______________________________________________________________________________________________ CARDIAC CATHETERIZATION  CARDIAC CATHETERIZATION 09/08/2023  Conclusion Images from the original result were not included. 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 No significant disease  LVEDP 12 mmHg  Successful percutaneous coronary intervention mid LAD Intravascular ultrasound (IVUS) PTCA and stent placement 2.25 X 30 mm Onyx drug-eluting stent Serial post dilatation from distal to proximal part of the stent using 2.5 mm & 3.5 mm [place] balloons  MSA  6.3 mm2 in distal part of the stent and >10 mm2 in proximal part of the stent    Unusually prolonged procedure time, radiation time, and contrast use due to difficulty engaging anomalous left circumflex artery, time spent in optimizing LAD stent that had large size mismatch  between distal and proximal vessel, requiring multiple rounds of IVUS and multiple rounds of [place] balloon post dilatation, as well as need to obtain multiple pictures in orthogonal views to adequately study unusual appearance of contrast dye hangup in septal branch unrelated to the intervention.  Recommend overnight stay with IV hydration to avoid contrast induced nephropathy  Newman JINNY Lawrence, MD  Findings Coronary Findings Diagnostic  Dominance: Right  Left Main Dist LM lesion is 20% stenosed.  Left Anterior Descending Mid LAD lesion is 99% stenosed.  First Septal Branch Vessel is small in size. 1st Sept lesion is 90% stenosed.  Second Septal Branch Vessel is small in size. 2nd Sept lesion is 70% stenosed.  Intervention  Mid LAD lesion Stent CATH LAUNCHER 6FR EBU 3 guide catheter was inserted. Lesion crossed with guidewire using a WIRE RUNTHROUGH .[id number]. Pre-stent angioplasty was performed using a BALLN [place] EUPHORA RX 3.0X12. A drug-eluting stent was successfully placed using a STENT ONYX FRONTIER 2.25X30. Maximum pressure: 14 atm. Inflation time: 20 sec. Stent strut is well apposed. Post-stent angioplasty was performed using a BALLN [place] EUPHORA RX 3.5X12. Maximum pressure:  16 atm. Inflation time:  20 sec. Post-Intervention Lesion Assessment The intervention was successful. Pre-interventional TIMI flow is 1. Post-intervention TIMI flow is 3. Ultrasound (IVUS) was performed on the lesion post PCI using a CATH OPTICROSS HD. Stent well expanded. There is a 0% residual stenosis post intervention.   STRESS TESTS  EXERCISE TOLERANCE TEST (ETT) 08/11/2022  Interpretation Summary   No ST deviation was noted.  ETT with good exercise capacity (10:30); no chest pain; normal blood pressure response; no diagnostic ST changes; negative adequate exercise treadmill.   ECHOCARDIOGRAM  ECHOCARDIOGRAM COMPLETE 07/29/2023  Narrative ECHOCARDIOGRAM REPORT    Patient Name:   HERSHEL CORKERY   Date of Exam: 07/29/2023 Medical Rec #:  [id number]     Height:       65.0 in Accession #:    [id number]    Weight:       214.0 lb Date of Birth:  04-14-1956    BSA:          2.036 m Patient Age:    66 years      BP:           139/79 mmHg Patient Gender: M             HR:           71 bpm. Exam Location:  [address]  Procedure: 2D Echo, 3D Echo, Cardiac Doppler and Color Doppler  Indications:    I34.0 Mitral Valve insufficiency  History:        Patient has prior history of Echocardiogram examinations, most recent 07/28/2022. Risk Factors:Diabetes, HLD and Sleep Apnea.  Sonographer:    Waldo Guadalajara RCS Referring Phys: [id number] SOYLA A ACHARYA  IMPRESSIONS   1. Left ventricular ejection fraction, by estimation, is 55 to 60%. Left ventricular ejection fraction by 3D volume is 60 %. The left ventricle has normal function. The left ventricle has no regional wall motion abnormalities. There is mild asymmetric left ventricular hypertrophy of the basal-septal segment (13 mm). Left ventricular diastolic parameters are consistent with Grade I diastolic dysfunction (impaired relaxation).  2. Right ventricular systolic function is normal. The right ventricular size is normal. There is normal pulmonary artery systolic pressure. The estimated right ventricular systolic pressure is 26.0 mmHg. 3. Mild posterior leaflet prolapse. The mitral valve is grossly normal. Mild to moderate mitral valve regurgitation. No evidence of mitral stenosis. 4. The aortic valve is tricuspid. Aortic valve regurgitation is trivial. No aortic stenosis is present. 5. The inferior vena cava is normal in size with greater than 50% respiratory variability, suggesting right atrial pressure of 3 mmHg.  FINDINGS Left Ventricle: Left ventricular ejection fraction, by estimation, is 55 to 60%. Left ventricular ejection fraction by 3D volume is 60 %. The left ventricle has normal function. The left ventricle has no regional  wall motion abnormalities. The left ventricular internal cavity size was normal in size. There is mild asymmetric left ventricular hypertrophy of the basal-septal segment. Left ventricular diastolic parameters are consistent with Grade I diastolic dysfunction (impaired relaxation).  Right Ventricle: The right ventricular size is normal. No increase in right ventricular wall thickness. Right ventricular systolic function is normal. There is normal pulmonary artery systolic pressure. The tricuspid regurgitant velocity is 2.40 m/s, and with an assumed right atrial pressure of 3 mmHg, the estimated right ventricular systolic pressure is 26.0 mmHg.  Left Atrium: Left atrial size was normal in size.  Right Atrium: Right atrial size was normal in size.  Pericardium: There is no evidence of pericardial effusion.  Mitral Valve: Mild posterior leaflet prolapse. The mitral valve is grossly normal. Mild to moderate mitral valve regurgitation. No evidence of mitral valve stenosis.  Tricuspid Valve: The tricuspid valve is normal in structure. Tricuspid valve regurgitation is mild . No evidence of tricuspid stenosis.  Aortic Valve: The aortic valve is tricuspid. Aortic valve regurgitation is trivial. Aortic regurgitation PHT measures 452 msec. No aortic stenosis is present.  Pulmonic Valve: The pulmonic valve was normal in structure. Pulmonic valve regurgitation is trivial. No evidence of pulmonic stenosis.  Aorta: The aortic root is normal in size and structure.  Venous: The inferior vena cava is normal in size with greater than 50% respiratory variability, suggesting right atrial pressure of 3 mmHg.  IAS/Shunts: No atrial level shunt detected by color flow Doppler.   LEFT VENTRICLE PLAX 2D LVIDd:         4.20 cm         Diastology LVIDs:         2.70 cm         LV e' medial:    6.20 cm/s LV PW:         0.90 cm         LV E/e' medial:  17.7 LV IVS:        1.30 cm         LV e' lateral:   8.92  cm/s LVOT diam:     1.90 cm         LV E/e' lateral: 12.3 LV SV:         58 LV SV Index:   28 LVOT Area:     2.84 cm        3D Volume EF LV 3D EF:    Left ventricul ar ejection fraction by 3D volume is 60 %.  3D Volume EF: 3D EF:        60 % LV EDV:       155 ml LV ESV:       63 ml LV SV:  92 ml  RIGHT VENTRICLE RV Basal diam:  2.70 cm RV S prime:     13.40 cm/s TAPSE (M-mode): 1.7 cm RVSP:           26.0 mmHg  LEFT ATRIUM             Index        RIGHT ATRIUM           Index LA diam:        4.30 cm 2.11 cm/m   RA Pressure: 3.00 mmHg LA Vol (A2C):   85.5 ml 42.00 ml/m  RA Area:     9.70 cm LA Vol (A4C):   42.5 ml 20.88 ml/m  RA Volume:   16.80 ml  8.25 ml/m LA Biplane Vol: 63.9 ml 31.39 ml/m AORTIC VALVE LVOT Vmax:   105.00 cm/s LVOT Vmean:  74.400 cm/s LVOT VTI:    0.204 m AI PHT:      452 msec  AORTA Ao Root diam: 2.70 cm Ao Asc diam:  3.10 cm  MITRAL VALVE                  TRICUSPID VALVE MV Area (PHT):                TR Peak grad:   23.0 mmHg MV Decel Time:                TR Vmax:        240.00 cm/s MR Peak grad:    96.4 mmHg    Estimated RAP:  3.00 mmHg MR Mean grad:    49.0 mmHg    RVSP:           26.0 mmHg MR Vmax:         491.00 cm/s MR Vmean:        312.0 cm/s   SHUNTS MR PISA:         2.65 cm     Systemic VTI:  0.20 m MR PISA Eff ROA: 17 mm       Systemic Diam: 1.90 cm MR PISA Radius:  0.65 cm MV E velocity: 110.00 cm/s MV A velocity: 123.00 cm/s MV E/A ratio:  0.89  Soyla Merck MD Electronically signed by Soyla Merck MD Signature Date/Time: 07/31/2023/9:48:42 PM    Final      CT SCANS  CT CORONARY MORPH W/CTA COR W/SCORE 08/10/2023  Addendum 08/11/2023 11:15 AM ADDENDUM REPORT: 08/11/2023 11:12  EXAM: OVER-READ INTERPRETATION  CT CHEST  The following report is an over-read performed by radiologist Dr. Lynwood Seip [facility] on 08/11/2023. This over-read does not include interpretation of  cardiac or coronary anatomy or pathology. The coronary CTA interpretation by the cardiologist is attached.  COMPARISON:  None.  FINDINGS: No significant abnormality seen involving the visualized portions of the extracardiac vascular structures. Visualized mediastinum is unremarkable. Visualized portion of upper abdomen is unremarkable. Visualized pulmonary parenchyma is unremarkable. Visualized skeleton is unremarkable.  IMPRESSION: No definite abnormality seen involving the visualized portions of the extracardiac structures in the chest.   Electronically Signed By: Lynwood Seip Raddle M.D. On: 08/11/2023 11:12  Addendum 08/10/2023  5:57 PM ADDENDUM REPORT: 08/10/2023 17:55  ADDENDUM: After viewing CT FFR data, the LAD needs to be re-interpreted. The noncalcified plaque mentioned in the mid LAD on the original report does not appear to be hemodynamically significant. However, just past this stenosis the LAD trifurcates into a large diagonal branch, a small diagonal branch, and the continuation  of the actual LAD. The LAD continuation is smaller in caliber than the large diagonal, but has a severe, hemodynamically significant stenosis (FFR 0.62 in the distal LAD).  Dalton Rolan   Electronically Signed By: Ezra Rolan M.D. On: 08/10/2023 17:55  Narrative CLINICAL DATA:  Chest pain  EXAM: Cardiac CTA  MEDICATIONS: Sub lingual nitro. 4mg  x 2  TECHNIQUE: The patient was scanned on a Siemens 192 slice scanner. Gantry rotation speed was 250 msecs. Collimation was 0.6 mm. A 100 kV prospective scan was triggered in the ascending thoracic aorta at 35-75% of the R-R interval. Average HR during the scan was 60 bpm. The 3D data set was interpreted on a dedicated work station using MPR, MIP and VRT modes. A total of 80cc of contrast was used.  FINDINGS: Non-cardiac: See separate report from [facility].  Pulmonary veins drain normally to the left  atrium.  Calcium Score: 312 Agatston units.  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  LAD system: The LAD originates from the left cusp. Serial mixed plaque mild stenoses in the proximal LAD (25-49%), noncalcified plaque in the mid LAD with possible moderate (50-69%) stenosis.  Circumflex system: Anomalous vessel off the right cusp, follows retro-aortic course to usual LCx territory. Noncalcified plaque in the mid LCx with mild (1-24%) stenosis.  RCA system: Calcified plaque in the mid and distal RCA, mild (1-24%) stenosis.  IMPRESSION: 1. Coronary artery calcium score 312 Agatston units. This places the patient in the 75th percentile for age and gender, suggesting intermediate risk for future cardiac events.  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  3. Possible moderate (50-69%) stenosis with noncalcified plaque in the mid LAD. Study sent for FFR to confirm hemodynamic significance.  Dalton Sales promotion account executive  Electronically Signed: By: Ezra Rolan M.D. On: 08/10/2023 17:21     ______________________________________________________________________________________________      Risk Assessment/Calculations           Physical Exam VS:  BP 124/80 (BP Location: Right Arm, Patient Position: Sitting, Cuff Size: Normal)   Pulse 77   Resp 16   Ht 5' 6 (1.676 m)   Wt 190 lb 6.4 oz (86.4 kg)   SpO2 98%   BMI 30.73 kg/m        Wt Readings from Last 3 Encounters:  06/28/24 190 lb 6.4 oz (86.4 kg)  02/01/24 193 lb (87.5 kg)  01/07/24 191 lb 12.8 oz (87 kg)    GEN: Well nourished, well developed in no acute distress NECK: No JVD; No carotid bruits CARDIAC: RRR, no murmurs, rubs, gallops RESPIRATORY:  Clear to auscultation without rales, wheezing or rhonchi  ABDOMEN: Soft, non-tender, non-distended EXTREMITIES:  No edema; No deformity    ASSESSMENT AND PLAN  Chest pain: Symptom was quite atypical and occurred on the right side of the chest.  Symptom occurred around April of this year.  It did not correlate with any exertional symptoms.  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  The right-sided chest pain has largely resolved and has not recurred in the past few weeks.  At this time, I recommend continue observation and reassess in 4 to 6 weeks  CAD: Previously underwent DES to LAD in October 2024.  Continue aspirin  Hyperlipidemia: Continue atorvastatin 80 mg daily.         Dispo: Follow-up in 4 to  6 weeks  Signed, Govani Radloff, [place]

## 2024-06-28 NOTE — Patient Instructions (Signed)
 Medication Instructions:  No changes *If you need a refill on your cardiac medications before your next appointment, please call your pharmacy*  Lab Work: No labs  Testing/Procedures: No testing  Follow-Up: At [facility], you and your health needs are our priority.  As part of our continuing mission to provide you with exceptional heart care, our providers are all part of one team.  This team includes your primary Cardiologist (physician) and Advanced Practice Providers or APPs (Physician Assistants and Nurse Practitioners) who all work together to provide you with the care you need, when you need it.  Your next appointment:   5-6 week(s)  Provider:   Scot Ford, PA-C  We recommend signing up for the patient portal called MyChart.  Sign up information is provided on this After Visit Summary.  MyChart is used to connect with patients for Virtual Visits (Telemedicine).  Patients are able to view lab/test results, encounter notes, upcoming appointments, etc.  Non-urgent messages can be sent to your provider as well.   To learn more about what you can do with MyChart, go to [email or web address].

## 2024-07-16 DIAGNOSIS — E1136 Type 2 diabetes mellitus with diabetic cataract: Secondary | ICD-10-CM | POA: Diagnosis not present

## 2024-07-16 DIAGNOSIS — I10 Essential (primary) hypertension: Secondary | ICD-10-CM | POA: Diagnosis not present

## 2024-07-16 DIAGNOSIS — E1169 Type 2 diabetes mellitus with other specified complication: Secondary | ICD-10-CM | POA: Diagnosis not present

## 2024-07-16 DIAGNOSIS — N400 Enlarged prostate without lower urinary tract symptoms: Secondary | ICD-10-CM | POA: Diagnosis not present

## 2024-07-31 DIAGNOSIS — U071 COVID-19: Secondary | ICD-10-CM | POA: Diagnosis not present

## 2024-08-03 ENCOUNTER — Ambulatory Visit (HOSPITAL_COMMUNITY)

## 2024-08-07 DIAGNOSIS — G4733 Obstructive sleep apnea (adult) (pediatric): Secondary | ICD-10-CM | POA: Diagnosis not present

## 2024-08-10 ENCOUNTER — Ambulatory Visit: Admitting: Physician Assistant

## 2024-08-30 ENCOUNTER — Ambulatory Visit (HOSPITAL_COMMUNITY)
Admission: RE | Admit: 2024-08-30 | Discharge: 2024-08-30 | Disposition: A | Discharge status: Home / Self Care | Source: Ambulatory Visit | Attending: Cardiovascular Disease | Admitting: Cardiovascular Disease

## 2024-08-30 DIAGNOSIS — I340 Nonrheumatic mitral (valve) insufficiency: Secondary | ICD-10-CM | POA: Diagnosis not present

## 2024-08-30 DIAGNOSIS — I10 Essential (primary) hypertension: Secondary | ICD-10-CM | POA: Diagnosis not present

## 2024-08-30 LAB — ECHOCARDIOGRAM COMPLETE
AR max vel: 2.53 cm2
AV Area VTI: 2.27 cm2
AV Area mean vel: 2.37 cm2
AV Mean grad: 4 mmHg
AV Peak grad: 7.6 mmHg
Ao pk vel: 1.38 m/s
Area-P 1/2: 2.56 cm2
MV M vel: 4.29 m/s
MV Peak grad: 73.6 mmHg
P 1/2 time: 611 ms
S' Lateral: 3 cm

## 2024-09-01 ENCOUNTER — Ambulatory Visit: Attending: Physician Assistant | Admitting: Physician Assistant

## 2024-09-01 ENCOUNTER — Encounter: Payer: Self-pay | Admitting: Physician Assistant

## 2024-09-01 VITALS — BP 122/62 | HR 76 | Ht 66.0 in | Wt 190.2 lb

## 2024-09-01 DIAGNOSIS — I10 Essential (primary) hypertension: Secondary | ICD-10-CM | POA: Diagnosis not present

## 2024-09-01 DIAGNOSIS — E119 Type 2 diabetes mellitus without complications: Secondary | ICD-10-CM | POA: Diagnosis not present

## 2024-09-01 DIAGNOSIS — I2510 Atherosclerotic heart disease of native coronary artery without angina pectoris: Secondary | ICD-10-CM

## 2024-09-01 DIAGNOSIS — R0789 Other chest pain: Secondary | ICD-10-CM | POA: Diagnosis not present

## 2024-09-01 DIAGNOSIS — E785 Hyperlipidemia, unspecified: Secondary | ICD-10-CM

## 2024-09-01 NOTE — Progress Notes (Unsigned)
  Cardiology Office Note   Date:  09/01/2024  ID:  [name], Patrick 1956-02-18, MRN [id number] PCP: Cleotilde Planas, MD  [facility] Providers Cardiologist:  Soyla DELENA Merck, MD { Click to update primary MD,subspecialty MD or APP then REFRESH:1}    History of Present Illness Patrick [facility] is a 68 y.o. male with past medical history of mitral valve regurgitation, RBBB, CAD, HTN, HLD, DM2 and OSA.  Patient was previously seen in September 2024 for exertional chest tightness and shortness of breath, coronary CT on 08/10/2023 showed 25 to 49% disease in proximal LAD, 50 to 69% disease in mid LAD, 1 to 24% disease in mid left circumflex artery, 1 to 24% disease in mid to distal RCA.  Subsequent FFR analysis obtained on 08/10/2023 showed hemodynamically significant lesion in mid LAD distal to the origin of a large diagonal vessel. Patient ultimately underwent cardiac catheterization on 09/08/2023, this showed 99% LAD lesion treated with 2.25 x 30 mm Onyx DES.  The procedure was prolonged due to difficulty engaging anomalous left circumflex artery.  Postprocedure, he was placed on aspirin  and Plavix .  Atorvastatin  was increased to 40 mg daily.  Protonix  was added.  I last saw the patient in August 2025 for evaluation of chest pain.  The location of the chest pain was different from the previous angina.  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  By the time I saw him, he has not had any significant chest pain for several weeks.  EKG was unchanged.  Given atypical symptom, I did not recommend any further workup.  Echocardiogram was repeated on 08/30/2024 given history of mitral regurgitation, this showed EF 55 to 60%, no regional wall motion abnormality, RVSP 18.8 mmHg, normal PA systolic pressure, mild MR.  Patient presents today for follow-up accompanied by wife.  His atypical chest pain he previously described has not  recurred since.  He has been able to do exertional activity without any issue.  His annual blood work will be done by PCP who he has a follow-up with in the next few weeks.  He has no lower extremity edema, orthopnea or PND.  I did not hear significant heart murmur on exam.  He can follow-up with Dr. Merck in April of May 2026.  ROS: ***  Studies Reviewed      *** Risk Assessment/Calculations {Does this patient have ATRIAL FIBRILLATION?[phone]}         Physical Exam VS:  BP 122/62   Pulse 76   Ht 5' 6 (1.676 m)   Wt 190 lb 3.2 oz (86.3 kg)   SpO2 98%   BMI 30.70 kg/m        Wt Readings from Last 3 Encounters:  09/01/24 190 lb 3.2 oz (86.3 kg)  06/28/24 190 lb 6.4 oz (86.4 kg)  02/01/24 193 lb (87.5 kg)    GEN: Well nourished, well developed in no acute distress NECK: No JVD; No carotid bruits CARDIAC: ***RRR, no murmurs, rubs, gallops RESPIRATORY:  Clear to auscultation without rales, wheezing or rhonchi  ABDOMEN: Soft, non-tender, non-distended EXTREMITIES:  No edema; No deformity   ASSESSMENT AND PLAN ***    {Are you ordering a CV Procedure (e.g. stress test, cath, DCCV, TEE, etc)?   Press F2        :[id number]}  Dispo: ***  Signed, Scot Ford, PA

## 2024-09-01 NOTE — Patient Instructions (Signed)
 Medication Instructions:   Your physician recommends that you continue on your current medications as directed. Please refer to the Current Medication list given to you today.  *If you need a refill on your cardiac medications before your next appointment, please call your pharmacy*    Follow-Up: At [facility], you and your health needs are our priority.  As part of our continuing mission to provide you with exceptional heart care, our providers are all part of one team.  This team includes your primary Cardiologist (physician) and Advanced Practice Providers or APPs (Physician Assistants and Nurse Practitioners) who all work together to provide you with the care you need, when you need it.  Your next appointment:   6 month(s)  Provider:   Gayatri A Acharya, MD

## 2024-09-06 DIAGNOSIS — Z23 Encounter for immunization: Secondary | ICD-10-CM | POA: Diagnosis not present

## 2024-09-06 DIAGNOSIS — Z0000 Encounter for general adult medical examination without abnormal findings: Secondary | ICD-10-CM | POA: Diagnosis not present

## 2024-09-11 DIAGNOSIS — R202 Paresthesia of skin: Secondary | ICD-10-CM | POA: Diagnosis not present

## 2024-09-11 DIAGNOSIS — E1159 Type 2 diabetes mellitus with other circulatory complications: Secondary | ICD-10-CM | POA: Diagnosis not present

## 2024-09-11 DIAGNOSIS — I2510 Atherosclerotic heart disease of native coronary artery without angina pectoris: Secondary | ICD-10-CM | POA: Diagnosis not present

## 2024-09-11 DIAGNOSIS — N400 Enlarged prostate without lower urinary tract symptoms: Secondary | ICD-10-CM | POA: Diagnosis not present

## 2024-09-11 DIAGNOSIS — E669 Obesity, unspecified: Secondary | ICD-10-CM | POA: Diagnosis not present

## 2024-09-11 DIAGNOSIS — F411 Generalized anxiety disorder: Secondary | ICD-10-CM | POA: Diagnosis not present

## 2024-09-11 DIAGNOSIS — G4733 Obstructive sleep apnea (adult) (pediatric): Secondary | ICD-10-CM | POA: Diagnosis not present

## 2024-09-11 DIAGNOSIS — E7800 Pure hypercholesterolemia, unspecified: Secondary | ICD-10-CM | POA: Diagnosis not present

## 2024-09-12 DIAGNOSIS — Z7189 Other specified counseling: Secondary | ICD-10-CM | POA: Diagnosis not present

## 2024-09-12 DIAGNOSIS — G4733 Obstructive sleep apnea (adult) (pediatric): Secondary | ICD-10-CM | POA: Diagnosis not present

## 2024-09-15 DIAGNOSIS — N5089 Other specified disorders of the male genital organs: Secondary | ICD-10-CM | POA: Diagnosis not present

## 2024-09-15 DIAGNOSIS — R3914 Feeling of incomplete bladder emptying: Secondary | ICD-10-CM | POA: Diagnosis not present

## 2024-09-15 DIAGNOSIS — N401 Enlarged prostate with lower urinary tract symptoms: Secondary | ICD-10-CM | POA: Diagnosis not present

## 2024-09-21 DIAGNOSIS — R3914 Feeling of incomplete bladder emptying: Secondary | ICD-10-CM | POA: Diagnosis not present

## 2024-09-21 DIAGNOSIS — N401 Enlarged prostate with lower urinary tract symptoms: Secondary | ICD-10-CM | POA: Diagnosis not present

## 2024-09-26 ENCOUNTER — Ambulatory Visit: Payer: Self-pay | Admitting: Internal Medicine

## 2024-09-26 DIAGNOSIS — I340 Nonrheumatic mitral (valve) insufficiency: Secondary | ICD-10-CM

## 2024-09-26 DIAGNOSIS — I10 Essential (primary) hypertension: Secondary | ICD-10-CM

## 2024-10-19 DIAGNOSIS — N401 Enlarged prostate with lower urinary tract symptoms: Secondary | ICD-10-CM | POA: Diagnosis not present

## 2024-10-19 DIAGNOSIS — N138 Other obstructive and reflux uropathy: Secondary | ICD-10-CM | POA: Diagnosis not present

## 2024-10-21 DIAGNOSIS — G4733 Obstructive sleep apnea (adult) (pediatric): Secondary | ICD-10-CM | POA: Diagnosis not present

## 2024-10-26 ENCOUNTER — Telehealth: Payer: Self-pay | Admitting: Internal Medicine

## 2024-10-26 NOTE — Telephone Encounter (Signed)
 Called and spoke to pt. Okay per Dr. Acharya to schedule either with DOD or APP. Offered 10/27/24 with DOD Davina), but pt not available this day. Pt states he has been having these chest pains for months (off and on).  No openings seen for the next week with DOD's/APP's. Pt is available any day the weeks of 10/30/24 and 11/06/24

## 2024-10-26 NOTE — Telephone Encounter (Signed)
 Pt reports he called in to schedule a follow up.  States he has had a pinpoint area on the right side of his chest, above his nipple that feels like someone is poking their finger in him.  Reports this has been noticeable for several months off and on. No other signs/symptoms, no recent cough, no SOB, no n/v, nothing makes it worse of better, not r/t activity.  Not severe enough to take anything for it like Tylenol , SL  NTg etc.  Does report that he works out at [employer] and could be a pulled muscle but he is just paranoid and wants to make sure it is nothing.  Advised I will forward this information to Dr Loni and her nurse for review.  Not currently scheduled for f/u, recall is for April 2026.

## 2024-10-26 NOTE — Telephone Encounter (Signed)
 Pt has been scheduled to be seen 10/27/24 at 8:20 am by Dr Loni.  He is aware to report to closest ED should symptoms worsen prior to then.

## 2024-10-26 NOTE — Telephone Encounter (Signed)
° °  Pt c/o of Chest Pain: STAT if active CP, including tightness, pressure, jaw pain, radiating pain to shoulder/upper arm/back, CP unrelieved by Nitro. Symptoms reported of SOB, nausea, vomiting, sweating.  1. Are you having CP right now? Yes     2. Are you experiencing any other symptoms (ex. SOB, nausea, vomiting, sweating)? No    3. Is your CP continuous or coming and going? Coming and going    4. Have you taken Nitroglycerin ? No    5. How long have you been experiencing CP? On and off for several weeks     6. If NO CP at time of call then end call with telling Pt to call back or call 911 if Chest pain returns prior to return call from triage team.  Pt states he has been having a poking pain and tightness in his chest. Pain has been on and off for several weeks, but active symptoms today made patient call. Please advise.

## 2024-10-27 ENCOUNTER — Ambulatory Visit: Attending: Internal Medicine | Admitting: Internal Medicine

## 2024-10-27 ENCOUNTER — Encounter: Payer: Self-pay | Admitting: Internal Medicine

## 2024-10-27 VITALS — BP 112/68 | HR 91 | Ht 66.0 in | Wt 192.0 lb

## 2024-10-27 DIAGNOSIS — I340 Nonrheumatic mitral (valve) insufficiency: Secondary | ICD-10-CM

## 2024-10-27 DIAGNOSIS — I4510 Unspecified right bundle-branch block: Secondary | ICD-10-CM | POA: Diagnosis not present

## 2024-10-27 DIAGNOSIS — E7800 Pure hypercholesterolemia, unspecified: Secondary | ICD-10-CM | POA: Diagnosis not present

## 2024-10-27 DIAGNOSIS — I2510 Atherosclerotic heart disease of native coronary artery without angina pectoris: Secondary | ICD-10-CM

## 2024-10-27 DIAGNOSIS — I10 Essential (primary) hypertension: Secondary | ICD-10-CM | POA: Diagnosis not present

## 2024-10-27 DIAGNOSIS — R072 Precordial pain: Secondary | ICD-10-CM

## 2024-10-27 NOTE — Patient Instructions (Signed)
 Medication Instructions:  No Changes *If you need a refill on your cardiac medications before your next appointment, please call your pharmacy*  Lab Work: None  Follow-Up: At [facility], you and your health needs are our priority.  As part of our continuing mission to provide you with exceptional heart care, our providers are all part of one team.  This team includes your primary Cardiologist (physician) and Advanced Practice Providers or APPs (Physician Assistants and Nurse Practitioners) who all work together to provide you with the care you need, when you need it.  Your next appointment:    March 2026  Provider:   Soyla DELENA Merck, MD or Hao Meng, [place] or Damien Braver, NP, or [place], [place]

## 2024-10-27 NOTE — Progress Notes (Unsigned)
 Cardiology Office Note:  .   Date:  10/27/2024  ID:  Patrick Cooley, DOB 15-Mar-1956, MRN [id number] PCP: Cleotilde Planas, MD  [facility] Providers Cardiologist:  Soyla DELENA Merck, MD    History of Present Illness: .   Patrick Cooley is a 68 y.o. male.  Discussed the use of AI scribe software for clinical note transcription with the patient, who gave verbal consent to proceed.  History of Present Illness Patrick Cooley is a 68 year old male with coronary artery disease who presents with right-sided chest pressure.  He has had intermittent right-sided chest pressure for several months, described as a poking sensation, rated 2/10. It is not linked to specific activity but can be more noticeable after gym workouts upon returning home. It is not related to meals and is not worsened by palpation.  He has coronary artery disease with prior stent placement. A previous stress test showed exercise-induced hypertension. He was started on losartan  but this was stopped due to low blood pressure and lightheadedness, and he has not taken antihypertensive medication since March. Prior echocardiograms and CT scans did not show concerning findings. His blood pressure has been reported as stable, though he is not monitoring it at home.  He is a engineer, water (retired but participates with station). He uses over-the-counter ibuprofen and Tylenol  for shoulder arthritis without effect on the chest pressure. He uses Tums for stomach acid, without consistent relief of the chest pressure.    ROS: negative except per HPI above.  Studies Reviewed: SABRA   EKG Interpretation Date/Time:  Friday October 27 2024 08:22:09 EST Ventricular Rate:  77 PR Interval:  226 QRS Duration:  142 QT Interval:  400 QTC Calculation: 452 R Axis:   26  Text Interpretation: Sinus rhythm with 1st degree A-V block Right bundle branch block When compared with ECG of 28-Jun-2024 14:19, No significant change  was found Confirmed by Merck Soyla ([id number]) on 10/27/2024 8:33:42 AM    Results RADIOLOGY Chest CT: No masses or nodules, no evidence of lung cancer (2024)  DIAGNOSTIC Echocardiogram: Mild mitral valve regurgitation EKG: Normal (10/27/2024) Risk Assessment/Calculations:       Physical Exam:   VS:  BP 112/68   Pulse 91   Ht 5' 6 (1.676 m)   Wt 192 lb (87.1 kg)   SpO2 94%   BMI 30.99 kg/m    Wt Readings from Last 3 Encounters:  10/27/24 192 lb (87.1 kg)  09/01/24 190 lb 3.2 oz (86.3 kg)  06/28/24 190 lb 6.4 oz (86.4 kg)     Physical Exam GENERAL: Alert, cooperative, well developed, no acute distress. HEENT: Normocephalic, normal oropharynx, moist mucous membranes. CHEST: Clear to auscultation bilaterally, no wheezes, rhonchi, or crackles. CARDIOVASCULAR: Normal heart rate and rhythm, S1 and S2 normal without murmurs. ABDOMEN: Soft, non-tender, non-distended, without organomegaly, normal bowel sounds. EXTREMITIES: No cyanosis or edema. NEUROLOGICAL: Cranial nerves grossly intact, moves all extremities without gross motor or sensory deficit. SKIN: Without abnormalities.   ASSESSMENT AND PLAN: .    Assessment and Plan Assessment & Plan Right-sided chest pressure Intermittent right-sided chest pressure, likely musculoskeletal. Low likelihood of cardiac etiology. - Try Tylenol  for discomfort, avoid ibuprofen. - Consider topical therapies like Bengay or Icy Hot. - Monitor blood pressure. - Keep a journal of symptoms and triggers. - Consider stress test or chest CT if symptoms persist or worsen.  Coronary artery disease with prior stent Coronary artery disease with prior stent. Current symptoms unlikely cardiac but  monitor due to history. - Monitor symptoms and report changes. - Follow-up appointment in March.  Essential hypertension Previously managed with losartan , discontinued due to hypotension. Blood pressure well-managed without medication. - Monitor blood  pressure regularly.  Mild mitral valve regurgitation Noted on echocardiogram, no significant changes or symptoms. - Routine monitoring of cardiac status.  Recording duration: 21 minutes      Kirklin Mcduffee, MD, FACC

## 2024-10-30 ENCOUNTER — Ambulatory Visit: Admitting: Emergency Medicine

## 2025-01-26 ENCOUNTER — Ambulatory Visit: Admitting: Internal Medicine
